# Patient Record
Sex: Female | Born: 1970 | Race: White | Hispanic: No | Marital: Single | Smoking: Current some day smoker
Health system: Southern US, Community
[De-identification: ages and names within clinical notes are randomized; demographics above are authoritative.]

## PROBLEM LIST (undated history)

## (undated) DIAGNOSIS — T4145XA Adverse effect of unspecified anesthetic, initial encounter: Secondary | ICD-10-CM

## (undated) DIAGNOSIS — T8859XA Other complications of anesthesia, initial encounter: Secondary | ICD-10-CM

## (undated) DIAGNOSIS — G43909 Migraine, unspecified, not intractable, without status migrainosus: Secondary | ICD-10-CM

## (undated) DIAGNOSIS — E119 Type 2 diabetes mellitus without complications: Secondary | ICD-10-CM

## (undated) DIAGNOSIS — K219 Gastro-esophageal reflux disease without esophagitis: Secondary | ICD-10-CM

## (undated) DIAGNOSIS — I959 Hypotension, unspecified: Secondary | ICD-10-CM

## (undated) DIAGNOSIS — M549 Dorsalgia, unspecified: Secondary | ICD-10-CM

## (undated) DIAGNOSIS — E1110 Type 2 diabetes mellitus with ketoacidosis without coma: Secondary | ICD-10-CM

## (undated) HISTORY — PX: APPENDECTOMY: SHX54

## (undated) HISTORY — PX: BACK SURGERY: SHX140

## (undated) HISTORY — PX: CHOLECYSTECTOMY: SHX55

## (undated) HISTORY — PX: NECK SURGERY: SHX720

## (undated) HISTORY — DX: Migraine, unspecified, not intractable, without status migrainosus: G43.909

## (undated) HISTORY — DX: Gastro-esophageal reflux disease without esophagitis: K21.9

---
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
## 2001-04-21 ENCOUNTER — Emergency Department (HOSPITAL_COMMUNITY): Admission: EM | Admit: 2001-04-21 | Discharge: 2001-04-22 | Payer: Self-pay | Admitting: Emergency Medicine

## 2001-08-11 ENCOUNTER — Ambulatory Visit (HOSPITAL_COMMUNITY): Admission: RE | Admit: 2001-08-11 | Discharge: 2001-08-12 | Payer: Self-pay | Admitting: Neurological Surgery

## 2001-08-11 ENCOUNTER — Encounter: Payer: Self-pay | Admitting: Neurological Surgery

## 2002-01-22 ENCOUNTER — Encounter: Admission: RE | Admit: 2002-01-22 | Discharge: 2002-03-03 | Payer: Self-pay | Admitting: Neurological Surgery

## 2002-04-28 ENCOUNTER — Encounter: Payer: Self-pay | Admitting: Neurological Surgery

## 2002-04-28 ENCOUNTER — Ambulatory Visit (HOSPITAL_COMMUNITY): Admission: RE | Admit: 2002-04-28 | Discharge: 2002-04-28 | Payer: Self-pay | Admitting: Neurological Surgery

## 2002-07-27 ENCOUNTER — Emergency Department (HOSPITAL_COMMUNITY): Admission: EM | Admit: 2002-07-27 | Discharge: 2002-07-28 | Payer: Self-pay | Admitting: Emergency Medicine

## 2002-07-28 ENCOUNTER — Encounter: Payer: Self-pay | Admitting: Emergency Medicine

## 2005-04-11 ENCOUNTER — Emergency Department (HOSPITAL_COMMUNITY): Admission: EM | Admit: 2005-04-11 | Discharge: 2005-04-11 | Payer: Self-pay | Admitting: Emergency Medicine

## 2005-05-16 ENCOUNTER — Emergency Department (HOSPITAL_COMMUNITY): Admission: EM | Admit: 2005-05-16 | Discharge: 2005-05-16 | Payer: Self-pay | Admitting: Emergency Medicine

## 2005-05-18 ENCOUNTER — Emergency Department (HOSPITAL_COMMUNITY): Admission: EM | Admit: 2005-05-18 | Discharge: 2005-05-19 | Payer: Self-pay | Admitting: Emergency Medicine

## 2005-06-05 ENCOUNTER — Encounter: Admission: RE | Admit: 2005-06-05 | Discharge: 2005-06-05 | Payer: Self-pay | Admitting: Neurological Surgery

## 2005-06-05 ENCOUNTER — Encounter: Admission: RE | Admit: 2005-06-05 | Discharge: 2005-06-05 | Payer: Self-pay | Admitting: Family Medicine

## 2005-08-10 ENCOUNTER — Encounter: Admission: RE | Admit: 2005-08-10 | Discharge: 2005-08-10 | Payer: Self-pay | Admitting: Neurological Surgery

## 2005-09-29 ENCOUNTER — Encounter: Admission: RE | Admit: 2005-09-29 | Discharge: 2005-09-29 | Payer: Self-pay | Admitting: Family Medicine

## 2007-03-18 ENCOUNTER — Emergency Department (HOSPITAL_COMMUNITY): Admission: EM | Admit: 2007-03-18 | Discharge: 2007-03-18 | Payer: Self-pay | Admitting: Emergency Medicine

## 2007-10-14 ENCOUNTER — Inpatient Hospital Stay (HOSPITAL_COMMUNITY): Admission: EM | Admit: 2007-10-14 | Discharge: 2007-10-15 | Payer: Self-pay | Admitting: Emergency Medicine

## 2007-10-26 ENCOUNTER — Emergency Department (HOSPITAL_COMMUNITY): Admission: EM | Admit: 2007-10-26 | Discharge: 2007-10-26 | Payer: Self-pay | Admitting: Emergency Medicine

## 2008-03-05 ENCOUNTER — Emergency Department (HOSPITAL_COMMUNITY): Admission: EM | Admit: 2008-03-05 | Discharge: 2008-03-05 | Payer: Self-pay | Admitting: Emergency Medicine

## 2008-06-09 ENCOUNTER — Emergency Department (HOSPITAL_COMMUNITY): Admission: EM | Admit: 2008-06-09 | Discharge: 2008-06-09 | Payer: Self-pay | Admitting: Emergency Medicine

## 2009-03-22 ENCOUNTER — Observation Stay (HOSPITAL_COMMUNITY): Admission: EM | Admit: 2009-03-22 | Discharge: 2009-03-23 | Payer: Self-pay | Admitting: Emergency Medicine

## 2009-04-19 ENCOUNTER — Emergency Department (HOSPITAL_COMMUNITY): Admission: EM | Admit: 2009-04-19 | Discharge: 2009-04-19 | Payer: Self-pay | Admitting: Emergency Medicine

## 2009-08-05 ENCOUNTER — Emergency Department (HOSPITAL_COMMUNITY): Admission: EM | Admit: 2009-08-05 | Discharge: 2009-08-05 | Payer: Self-pay | Admitting: Emergency Medicine

## 2010-01-21 ENCOUNTER — Emergency Department (HOSPITAL_COMMUNITY): Admission: EM | Admit: 2010-01-21 | Discharge: 2010-01-21 | Payer: Self-pay | Admitting: Emergency Medicine

## 2010-03-06 ENCOUNTER — Encounter: Admission: RE | Admit: 2010-03-06 | Discharge: 2010-03-06 | Payer: Self-pay | Admitting: Internal Medicine

## 2010-08-07 ENCOUNTER — Encounter: Admission: RE | Admit: 2010-08-07 | Discharge: 2010-08-07 | Payer: Self-pay | Admitting: Occupational Medicine

## 2010-10-13 ENCOUNTER — Emergency Department (HOSPITAL_COMMUNITY)
Admission: EM | Admit: 2010-10-13 | Discharge: 2010-10-13 | Payer: Self-pay | Source: Home / Self Care | Admitting: Emergency Medicine

## 2010-11-03 ENCOUNTER — Emergency Department (HOSPITAL_COMMUNITY)
Admission: EM | Admit: 2010-11-03 | Discharge: 2010-11-04 | Payer: Self-pay | Source: Home / Self Care | Admitting: Emergency Medicine

## 2010-12-20 ENCOUNTER — Emergency Department (HOSPITAL_COMMUNITY)
Admission: EM | Admit: 2010-12-20 | Discharge: 2010-12-20 | Payer: Self-pay | Source: Home / Self Care | Admitting: Emergency Medicine

## 2010-12-20 LAB — RAPID STREP SCREEN (MED CTR MEBANE ONLY): Streptococcus, Group A Screen (Direct): NEGATIVE

## 2010-12-30 ENCOUNTER — Emergency Department (HOSPITAL_COMMUNITY)
Admission: EM | Admit: 2010-12-30 | Discharge: 2010-12-30 | Payer: Self-pay | Source: Home / Self Care | Admitting: Emergency Medicine

## 2011-01-06 ENCOUNTER — Encounter: Payer: Self-pay | Admitting: Family Medicine

## 2011-02-28 ENCOUNTER — Emergency Department (HOSPITAL_COMMUNITY)
Admission: EM | Admit: 2011-02-28 | Discharge: 2011-02-28 | Disposition: A | Discharge status: Home / Self Care | Payer: Self-pay | Attending: Emergency Medicine | Admitting: Emergency Medicine

## 2011-02-28 DIAGNOSIS — G43909 Migraine, unspecified, not intractable, without status migrainosus: Secondary | ICD-10-CM | POA: Insufficient documentation

## 2011-02-28 DIAGNOSIS — R110 Nausea: Secondary | ICD-10-CM | POA: Insufficient documentation

## 2011-03-06 LAB — RAPID STREP SCREEN (MED CTR MEBANE ONLY): Streptococcus, Group A Screen (Direct): NEGATIVE

## 2011-03-23 LAB — CBC
HCT: 42.1 % (ref 36.0–46.0)
Hemoglobin: 14.6 g/dL (ref 12.0–15.0)
MCHC: 34.7 g/dL (ref 30.0–36.0)
MCV: 95.5 fL (ref 78.0–100.0)
WBC: 9.7 10*3/uL (ref 4.0–10.5)

## 2011-03-23 LAB — COMPREHENSIVE METABOLIC PANEL
Alkaline Phosphatase: 55 U/L (ref 39–117)
BUN: 10 mg/dL (ref 6–23)
CO2: 27 mEq/L (ref 19–32)
Calcium: 9.1 mg/dL (ref 8.4–10.5)
Chloride: 108 mEq/L (ref 96–112)
Potassium: 4.8 mEq/L (ref 3.5–5.1)
Total Bilirubin: 0.5 mg/dL (ref 0.3–1.2)

## 2011-03-23 LAB — DIFFERENTIAL
Basophils Absolute: 0.1 10*3/uL (ref 0.0–0.1)
Eosinophils Relative: 2 % (ref 0–5)
Monocytes Absolute: 0.7 10*3/uL (ref 0.1–1.0)
Neutro Abs: 6.1 10*3/uL (ref 1.7–7.7)
Neutrophils Relative %: 63 % (ref 43–77)

## 2011-03-23 LAB — URINALYSIS, ROUTINE W REFLEX MICROSCOPIC
Bilirubin Urine: NEGATIVE
Specific Gravity, Urine: 1.025 (ref 1.005–1.030)
pH: 6.5 (ref 5.0–8.0)

## 2011-03-23 LAB — PREGNANCY, URINE: Preg Test, Ur: NEGATIVE

## 2011-03-23 LAB — LIPASE, BLOOD: Lipase: 28 U/L (ref 11–59)

## 2011-03-26 ENCOUNTER — Emergency Department (HOSPITAL_COMMUNITY)
Admission: EM | Admit: 2011-03-26 | Discharge: 2011-03-26 | Disposition: A | Discharge status: Home / Self Care | Payer: Self-pay | Attending: Emergency Medicine | Admitting: Emergency Medicine

## 2011-03-26 DIAGNOSIS — B9789 Other viral agents as the cause of diseases classified elsewhere: Secondary | ICD-10-CM | POA: Insufficient documentation

## 2011-03-26 DIAGNOSIS — R05 Cough: Secondary | ICD-10-CM | POA: Insufficient documentation

## 2011-03-26 DIAGNOSIS — F17200 Nicotine dependence, unspecified, uncomplicated: Secondary | ICD-10-CM | POA: Insufficient documentation

## 2011-03-26 DIAGNOSIS — R059 Cough, unspecified: Secondary | ICD-10-CM | POA: Insufficient documentation

## 2011-03-26 DIAGNOSIS — R112 Nausea with vomiting, unspecified: Secondary | ICD-10-CM | POA: Insufficient documentation

## 2011-03-26 DIAGNOSIS — J029 Acute pharyngitis, unspecified: Secondary | ICD-10-CM | POA: Insufficient documentation

## 2011-03-26 DIAGNOSIS — R509 Fever, unspecified: Secondary | ICD-10-CM | POA: Insufficient documentation

## 2011-03-26 DIAGNOSIS — R300 Dysuria: Secondary | ICD-10-CM | POA: Insufficient documentation

## 2011-03-26 LAB — URINALYSIS, ROUTINE W REFLEX MICROSCOPIC
Glucose, UA: NEGATIVE mg/dL
Nitrite: NEGATIVE
Specific Gravity, Urine: 1.03 (ref 1.005–1.030)
Urobilinogen, UA: 0.2 mg/dL (ref 0.0–1.0)
pH: 5 (ref 5.0–8.0)

## 2011-03-26 LAB — RAPID STREP SCREEN (MED CTR MEBANE ONLY): Streptococcus, Group A Screen (Direct): NEGATIVE

## 2011-03-27 LAB — CBC
HCT: 38.3 % (ref 36.0–46.0)
Hemoglobin: 13.4 g/dL (ref 12.0–15.0)
MCHC: 35.1 g/dL (ref 30.0–36.0)
RBC: 3.77 MIL/uL — ABNORMAL LOW (ref 3.87–5.11)
WBC: 7.2 10*3/uL (ref 4.0–10.5)

## 2011-03-27 LAB — DIFFERENTIAL
Basophils Absolute: 0 10*3/uL (ref 0.0–0.1)
Eosinophils Relative: 2 % (ref 0–5)
Lymphocytes Relative: 34 % (ref 12–46)
Lymphs Abs: 2.6 10*3/uL (ref 0.7–4.0)
Monocytes Relative: 8 % (ref 3–12)
Neutro Abs: 3.9 10*3/uL (ref 1.7–7.7)
Neutro Abs: 4.4 10*3/uL (ref 1.7–7.7)
Neutrophils Relative %: 54 % (ref 43–77)
Neutrophils Relative %: 56 % (ref 43–77)

## 2011-03-27 LAB — BASIC METABOLIC PANEL
Calcium: 8.3 mg/dL — ABNORMAL LOW (ref 8.4–10.5)
Calcium: 9 mg/dL (ref 8.4–10.5)
Chloride: 110 mEq/L (ref 96–112)
Chloride: 112 mEq/L (ref 96–112)
Creatinine, Ser: 0.55 mg/dL (ref 0.4–1.2)
Creatinine, Ser: 0.64 mg/dL (ref 0.4–1.2)
GFR calc Af Amer: >60 mL/min (ref >60)
GFR calc Af Amer: >60 mL/min (ref >60)
GFR calc non Af Amer: >60 mL/min (ref >60)
Glucose, Bld: 102 mg/dL — ABNORMAL HIGH (ref 70–99)
Potassium: 3.2 mEq/L — ABNORMAL LOW (ref 3.5–5.1)
Sodium: 141 mEq/L (ref 135–145)

## 2011-03-27 LAB — HEPATIC FUNCTION PANEL
ALT: 38 U/L — ABNORMAL HIGH (ref 0–35)
Albumin: 3.8 g/dL (ref 3.5–5.2)
Indirect Bilirubin: 0.7 mg/dL (ref 0.3–0.9)
Total Protein: 6.2 g/dL (ref 6.0–8.3)

## 2011-03-27 LAB — URINALYSIS, ROUTINE W REFLEX MICROSCOPIC
Glucose, UA: NEGATIVE mg/dL
Protein, ur: NEGATIVE mg/dL
Specific Gravity, Urine: >1.03 — ABNORMAL HIGH (ref 1.005–1.030)
Urobilinogen, UA: 0.2 mg/dL (ref 0.0–1.0)
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
## 2011-04-30 NOTE — H&P (Signed)
Alexandria Mejia, Alexandria Mejia            ACCOUNT NO.:  [id number]   MEDICAL RECORD NO.:  [id number]          PATIENT TYPE:  OBV   LOCATION:  A315                          FACILITY:  [facility]   PHYSICIAN:  Osvaldo Shipper, MD     DATE OF BIRTH:  [birth year]   DATE OF ADMISSION:  03/21/2009  DATE OF DISCHARGE:  LH                              HISTORY & PHYSICAL   PRIMARY CARE PHYSICIAN:  The patient does not have a PMD.   ADMISSION DIAGNOSES:  1. Likely spider bite, possibly black widow spider.  2. Abdominal cramps, nausea and vomiting, likely related to #1.  3. Obesity.  4. History of migraine headaches.   CHIEF COMPLAINT:  Left arm pain, nausea and abdominal cramps.   HISTORY OF PRESENT ILLNESS:  The patient is a 40 year old Caucasian  female who is obese who has a history of migraine headaches, who was in  her usual state of health at about 8 p.m. on April 6 when she was  working in her yard and then after awhile noticed that her left arm was  itching.  Then she noticed that the area on the forearm was red in  color.  It was hot to touch and it started paining.  This was followed  by onset of nausea and severe abdominal cramping.  The cramps caused  severe pain up to 8/10 in intensity.  Diffusely present all over the  abdomen without any specific radiation.  No precipitant, aggravating or  relieving factors identified.   She is also having a headache now.  The pain in the left arm feels like  a burning sensation.  She does not recall the actual bite.   MEDICATIONS AT HOME:  She just takes ibuprofen as needed for back pain.   ALLERGIES:  CODEINE and SULFA.   PAST MEDICAL HISTORY:  Positive for migraine headache.   SURGICAL HISTORY:  Includes back surgery x2 and knee surgery x2, neck  surgery, appendectomy and cholecystectomy.   SOCIAL HISTORY:  Lives alone in [place].  Does not work.  Smokes half  a pack of cigarettes on a daily basis.  Occasional alcohol use.  No  illicit drug  use.   FAMILY HISTORY:  Positive for heart disease, diabetes and hypertension.   REVIEW OF SYSTEMS:  GENERAL:  This is positive for weakness, malaise.  HEENT:  Unremarkable.  CARDIOVASCULAR:  Unremarkable.  RESPIRATORY:  Unremarkable.  GI:  As in HPI.  GU:  Unremarkable.  NEUROLOGIC:  Unremarkable.  PSYCHIATRIC:  Unremarkable.  DERMATOLOGIC:  As in HPI.  Other systems unremarkable.   PHYSICAL EXAMINATION:  Temperature 98.2, blood pressure 96/50, heart  rate 61, respiratory rate 16, saturation 98% on room air.  GENERAL EXAM:  This is an obese white female in no distress, slightly  somnolent at this time because of benzodiazepines that were given to her  along with narcotics.  HEENT:  There is no pallor, no icterus.  Oral mucous membrane is moist.  No oral lesions are noted.  NECK:  Soft and supple.  No thyromegaly is appreciated.  LUNGS:  Clear to auscultation anteriorly bilaterally.  No wheezing,  rales or rhonchi.  CARDIOVASCULAR:  S1, S2 is normal, regular.  No murmurs appreciated.  No  S3, S4.  No rubs, no bruits.  ABDOMEN:  Soft.  It is diffusely tender without any rebound or rigidity.  Bowel sounds are present.  No masses or organomegaly are appreciated.  EXTREMITIES:  Show no edema.  Examination of the left forearm reveals a  punctate lesion in the left forearm with surrounding erythema.  The area  is warm to touch and it is tender to palpation.  There is an induration  that is noted around this area.  MUSCULOSKELETAL:  Unremarkable.  NEUROLOGIC:  She is alert and oriented x3.  No focal neurologic deficits  are present.   LABORATORY DATA:  Her CBC is normal.  Her BMET showed a potassium of  3.2.  LFTs unremarkable.  Lipase 33.  Beta HCG negative.  UA was  unremarkable for infection.  Specific gravity was greater than 1.030.   Imaging studies include acute abdominal series with suspected SBO.  A CT  was subsequently done, which showed no acute abnormalities.  Fatty liver   was noted, probable peristalsis causing the narrowing in gastric antrum.  No dilated small bowel loops noted on this CT.  Minimal calcification of  the right common iliac artery was noted.   ASSESSMENT:  This is a 40 year old Caucasian female who has a history of  migraine headache, who presents with arm pain and abdominal cramps.  Emergency department physician thinks that this could be a black widow  spider bite.  They have discussed the case with the poison control, who  also feel the same.  The gastrointestinal symptoms are likely because of  neurotoxin from the spider.  The poison center does not feel that at  this time the patient warrants antivenom.  They recommend supportive  care.   PLAN:  1. Possible spider bite.  We will observe her in the hospital.  We      will treat her symptomatically with benzodiazepines, narcotics.      She has received a tetanus shot within the last 5 years and that is      not required.  Since there is erythema and induration, I will go      ahead and give her IV antibiotics in the form of cefazolin.  Blood      pressure will be closely monitored as these kind of spider bites      can cause hemodynamic compromise.  She will be monitored on      telemetry.  If abdominal cramping does not improve, antispasmodics      may be considered such as Bentyl or Levsin.  2. Hypokalemia will be repleted.  I believe the ED physician had      ordered some potassium.  It does not appear to      be the case.  We will give her some potassium p.o. and give her      some p.o. through the IV as well.  3. DVT prophylaxis with Lovenox.   Further management decisions will depend on the results of further  testing and the patient's response to treatment.      Osvaldo Shipper, MD  Electronically Signed     GK/MEDQ  D:  03/22/2009  T:  03/22/2009  Job:  [id number]

## 2011-04-30 NOTE — Discharge Summary (Signed)
NAMEVIRGA, HALTIWANGER            ACCOUNT NO.:  [id number]   MEDICAL RECORD NO.:  [id number]          PATIENT TYPE:  OBV   LOCATION:  A315                          FACILITY:  [facility]   PHYSICIAN:  Dorris Singh, DO    DATE OF BIRTH:  [birth year]   DATE OF ADMISSION:  03/21/2009  DATE OF DISCHARGE:  04/08/2010LH                               DISCHARGE SUMMARY   ADMISSION DIAGNOSES:  1. Spider bite, black widow.  2. Abdominal cramps, nausea and vomiting related to  #1.  3. Obesity.  4. Migraine headaches.   DISCHARGE DIAGNOSES:  1. Spider bite, resolving.  2. Abdominal cramps which were resolving.  3. Obesity.  4. History of migraine headaches.   TESTING:  She had an acute abdominal series which demonstrated  nonspecific gas bowel pattern, mildly dilated loops of small bowel, at  least one air fluid level, and to her represent low grade partial bowel  obstruction.  Serial abdominal __________ advised.  She had CT of the  abdomen and pelvis on April 7 which demonstrated no acute abnormality  with fatty liver and probably peristalsis causing narrowing gastric  antrum and the pelvis showed no evidence of pelvic inflammatory process.  Small bowel loops do not appear dilated or questioned on recent plain  films.  Minimal calcifications right common iliac arteries.   HOSPITAL COURSE:  The patient was admitted with the following diagnosis.  She was started on IV antibiotics.  There was some concern as to whether  this was a black widow spider bite.  Poison Control was called who also  recommended that the patient be admitted and that the gastrointestinal  symptoms were likely due to the neurotoxin of the spider.  Supportive  care is recommended.  She was started on IV antibiotics in the form of  cefazolin.  Her  blood pressure was carefully monitored as well because  of hemodynamic compromise.  While she was here, she ws also placed on  telemetry.  She did have some episodes of  bradycardia.  However,  questioning she  has been told in the past that she does have a low  heart rate, particularly when she sleeps due to previous evaluations.  Her hypokalemia was replaced and it was determined today that she could  be sent home and the patient wants to go home due to a previous  commitment.  She was set up with PCP on-call and also given some Keflex  500 mg one p.o. t.i.d. for the next 5 days.  Her vital signs were  reviewed.  It was determined that the patient could go home today.  Also  her labs were reviewed as well.  She had no abnormalities.  She is  recommended to follow up with the PCP, to  keep area clean and dry, to use cold compresses for pain and Tylenol for  pain.  She is recommended if symptoms worsen, to return.  The patient  stated understanding.  Interview was obtained with family members in her  room.  Spent greater than 30 minutes on this discharge.      Dorris Singh, DO  Electronically Signed     CB/MEDQ  D:  03/23/2009  T:  03/23/2009  Job:  [id number]

## 2011-04-30 NOTE — Group Therapy Note (Signed)
Mejia, Alexandria            ACCOUNT NO.:  [id number]   MEDICAL RECORD NO.:  [id number]          PATIENT TYPE:  INP   LOCATION:  A310                          FACILITY:  [facility]   PHYSICIAN:  Dorris Singh, DO    DATE OF BIRTH:  06-08-1971   DATE OF PROCEDURE:  DATE OF DISCHARGE:                                 PROGRESS NOTE   The patient seen today, states that she feels a little bit better.  She  has fluoroscopy guided LP.  Preliminary results show that she has no  white cells or red cells in her CSF.  There are still some tests pending  at this point in time.  States that her headache is better.  She does  not have the phonophobia, but still is present.  Discussed with the  patient at length the need for primary care.  She also do her  preventative testing like her Pap smear and her screening mammogram.  The patient stated understanding.  I told her we will go ahead and  monitor her, and await for neurology's recommendations and plan on  discharging tomorrow.   The patient's vitals are 98.2 for temperature, pulse 55, respirations  20, blood pressure 105/57.  Generally this is a 40 year old Caucasian female who is well-developed,  well-nourished in no acute distress.  Eyes are PERRL.  EOMI.  The patient has positive body piercings of the  eyebrow.  HEART:  Regular rate and rhythm.  LUNGS:  Clear to auscultation bilaterally.  ABDOMEN:  Large, obese, soft, nontender, nondistended.  EXTREMITIES:  Positive pulses.  No ecchymosis, cyanosis, or edema noted.   Her blood work from today, her INR is 1.0.  She did not have a CBC done  today but yesterday that was within normal limits.  Her cholesterol is  168 and her HDL 26, her LDL 118.  She had a drug screen that was  positive for barbiturates, but negative for everything else that was  done today, as well as her CSF protein within normal limits.  Glucose  within normal limits.  Everything else seems to be normal.  Her UA was  normal as well.  Her blood culture is negative.  At this point in time  we will start the patient plan.   ASSESSMENT:  Severe headaches and chest pain which is not resolved.  The  patient has not complained about it.   PLAN:  Will hold on the patient tonight since she is still having,  according to her, severe headaches.  We will plan on discharge tomorrow  and will have her follow up with a primary care physician.  Will also  start her on Topamax 250 mg and have her follow up that outpatient as  well.      Dorris Singh, DO  Electronically Signed     CB/MEDQ  D:  10/14/2007  T:  10/15/2007  Job:  [id number]

## 2011-05-03 NOTE — Op Note (Signed)
[place]  Patient:    Alexandria Mejia, Alexandria Mejia Visit Number: [id number] MRN: [id number]          Service Type: DSU Location: 3000 [id number] Attending Physician:  Jonne Ply Dictated by:   Stefani Dama, M.D. Proc. Date: 08/11/01 Adm. 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  PROCEDURE:  Microendoscopic diskectomy at L4-5 left with operating microscope and microdissection technique.  SURGEON:  Stefani Dama, M.D.  FIRST ASSISTANT:  Cristi Loron, M.D.  ANESTHESIA:  General endotracheal.  INDICATIONS:  The patient is a 40 year old individual who has had significant back and left lower extremity pain for a period of about four months.  She has tried a number of conservative measures and had seemed to be getting better for a period of time, but over the past month she has had ______ of the pain which she notes is much more severe and acute.  She was advised regarding surgery after an MRI demonstrated a small disk herniation that she had at the L4-5 level on the left that is becoming increasingly large.  PROCEDURE:  The patient was brought to the operating room supine on a stretcher.  After smooth induction of general endotracheal anesthesia she was turned prone.  The back was shaved, prepped with DuraPrep, and draped in a sterile fashion.  The midline was localized radiographically and then the left L4-5 area was also identified on an AP fluoroscopy.  Then, with lateral fluoroscopy the left laminar arch of L4 was localized with a K-wire.  A small incision was made in the skin and then a series of dilators were passed over the K-wire using a winding technique.  The subcutaneous tissues were dissected down to the interlaminar  space at L4-5.  The 18 mm x 6 cm cannula was placed at the L4-5 space on the left.  The microscope was draped and brought into the field and then the soft tissues overlying the surface were cauterized with a monopolar cautery and then the interlaminar space was cleared.  The L4 lamina was then removed partially using an Anspach drill with a 2.8 mm dissecting tool to remove the inferior margin lamina of L4 at the mesial wall of the facette.  The L ligament was then taken up in this area and the common dural tube was explored.  As the lateral aspect of the dural tube was identified and the edge could be retracted medially there was noted to be a significant mass at the L4-5 disk space, elevating the common dural tube right at the takeoff of the L5 nerve root.  This was dissected free and cleared of some small epidural veins, and then with medial retraction of the dural tube and the takeoff of the L5 nerve root the mass was incised.  This was found to be several fragments of disk material in the subligamentous space.  Once these were evacuated the space was noted to connect through the disk space itself which a significant quantity of markedly degenerated disk material.  Using a combination of curettes and rongeurs, then the disk space was evacuated of this disk material.  In the end, the ligamentous structures that were loosened were carefully  rongeured smooth and flat and the disk space was noted to be well decompressed, and the common dural tube and takeoff of the L5 nerve root lay flat against the bone.  The area was then checked for hemostasis in the soft tissues, and once this was achieved adequately the microscope was removed from the field, the endoscopic KO was removed, and then the subcutaneous tissues were closed with 3-0 Vicryl in interrupted fashion, 3-0 Vicryl using the subcuticular tissues to close the skin.  The patient tolerated the procedure well, was returned to the recovery  room in stable condition. Dictated by:   Stefani Dama, M.D. Attending Physician:  Jonne Ply DD:  08/11/01 TD:  08/11/01 Job: [id number]

## 2011-09-25 LAB — PROTEIN, CSF: Total  Protein, CSF: 30

## 2011-09-25 LAB — URINALYSIS, ROUTINE W REFLEX MICROSCOPIC
Bilirubin Urine: NEGATIVE
Glucose, UA: NEGATIVE
Ketones, ur: NEGATIVE
Leukocytes, UA: NEGATIVE
Leukocytes, UA: NEGATIVE
Nitrite: NEGATIVE
Protein, ur: NEGATIVE
Specific Gravity, Urine: >1.03 — ABNORMAL HIGH
Urobilinogen, UA: 0.2
pH: 5.5

## 2011-09-25 LAB — STREP A DNA PROBE

## 2011-09-25 LAB — CBC
MCHC: 34.4
RBC: 4.54
WBC: 10.9 — ABNORMAL HIGH

## 2011-09-25 LAB — APTT: aPTT: 27

## 2011-09-25 LAB — CARDIAC PANEL(CRET KIN+CKTOT+MB+TROPI)
CK, MB: 0.7
CK, MB: 0.8
Total CK: 59
Total CK: 60
Troponin I: 0.01
Troponin I: 0.03

## 2011-09-25 LAB — URINE MICROSCOPIC-ADD ON

## 2011-09-25 LAB — GLUCOSE, CSF: Glucose, CSF: 64

## 2011-09-25 LAB — CSF CELL COUNT WITH DIFFERENTIAL
RBC Count, CSF: 0
Tube #: 4

## 2011-09-25 LAB — BASIC METABOLIC PANEL
Calcium: 9
Creatinine, Ser: 0.62
GFR calc Af Amer: >60

## 2011-09-25 LAB — RAPID URINE DRUG SCREEN, HOSP PERFORMED: Tetrahydrocannabinol: NOT DETECTED

## 2011-09-25 LAB — HEPATIC FUNCTION PANEL
ALT: 36 — ABNORMAL HIGH
AST: 25
Alkaline Phosphatase: 68
Bilirubin, Direct: <0.1
Total Bilirubin: 0.6

## 2011-09-25 LAB — DIFFERENTIAL
Basophils Relative: 1
Monocytes Relative: 7
Neutro Abs: 6.5
Neutrophils Relative %: 60

## 2011-09-25 LAB — POCT CARDIAC MARKERS
CKMB, poc: <1 — ABNORMAL LOW
Myoglobin, poc: 28.4
Operator id: 106841

## 2011-09-25 LAB — LIPID PANEL
Total CHOL/HDL Ratio: 6.5
VLDL: 24

## 2011-09-25 LAB — CULTURE, BLOOD (ROUTINE X 2)
Culture: NO GROWTH
Report Status: 11032008

## 2011-09-25 LAB — CSF CULTURE W GRAM STAIN
Culture: NO GROWTH
Gram Stain: NONE SEEN

## 2011-09-25 LAB — HSV PCR

## 2011-09-25 LAB — PROTIME-INR: INR: 1

## 2011-12-01 ENCOUNTER — Emergency Department (HOSPITAL_COMMUNITY)
Admission: EM | Admit: 2011-12-01 | Discharge: 2011-12-01 | Disposition: A | Discharge status: Home / Self Care | Payer: Self-pay | Attending: Emergency Medicine | Admitting: Emergency Medicine

## 2011-12-01 ENCOUNTER — Emergency Department (HOSPITAL_COMMUNITY): Payer: Self-pay

## 2011-12-01 DIAGNOSIS — R110 Nausea: Secondary | ICD-10-CM | POA: Insufficient documentation

## 2011-12-01 DIAGNOSIS — H538 Other visual disturbances: Secondary | ICD-10-CM | POA: Insufficient documentation

## 2011-12-01 DIAGNOSIS — R404 Transient alteration of awareness: Secondary | ICD-10-CM | POA: Insufficient documentation

## 2011-12-01 DIAGNOSIS — F17200 Nicotine dependence, unspecified, uncomplicated: Secondary | ICD-10-CM | POA: Insufficient documentation

## 2011-12-01 DIAGNOSIS — M546 Pain in thoracic spine: Secondary | ICD-10-CM | POA: Insufficient documentation

## 2011-12-01 DIAGNOSIS — M542 Cervicalgia: Secondary | ICD-10-CM | POA: Insufficient documentation

## 2011-12-01 DIAGNOSIS — W1789XA Other fall from one level to another, initial encounter: Secondary | ICD-10-CM | POA: Insufficient documentation

## 2011-12-01 DIAGNOSIS — S0990XA Unspecified injury of head, initial encounter: Secondary | ICD-10-CM | POA: Insufficient documentation

## 2011-12-01 DIAGNOSIS — M545 Low back pain, unspecified: Secondary | ICD-10-CM | POA: Insufficient documentation

## 2011-12-01 MED ORDER — NAPROXEN 500 MG PO TABS: 500.0000 mg | ORAL_TABLET | Freq: Two times a day (BID) | ORAL | Status: DC

## 2011-12-01 MED ORDER — HYDROCODONE-ACETAMINOPHEN 5-325 MG PO TABS
ORAL_TABLET | ORAL | Status: AC
  Filled 2011-12-01: qty 2

## 2011-12-01 MED ORDER — HYDROCODONE-ACETAMINOPHEN 5-325 MG PO TABS
2.0000 | ORAL_TABLET | Freq: Once | ORAL | Status: AC
  Administered 2011-12-01: 2 via ORAL

## 2011-12-01 MED ORDER — CYCLOBENZAPRINE HCL 10 MG PO TABS: 10.0000 mg | ORAL_TABLET | Freq: Two times a day (BID) | ORAL | Status: AC | PRN
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
## 2012-07-28 ENCOUNTER — Emergency Department (HOSPITAL_COMMUNITY): Payer: BC Managed Care – PPO

## 2012-07-28 ENCOUNTER — Emergency Department (HOSPITAL_COMMUNITY)
Admission: EM | Admit: 2012-07-28 | Discharge: 2012-07-28 | Disposition: A | Discharge status: Home / Self Care | Payer: BC Managed Care – PPO | Attending: Emergency Medicine | Admitting: Emergency Medicine

## 2012-07-28 ENCOUNTER — Encounter (HOSPITAL_COMMUNITY): Payer: Self-pay

## 2012-07-28 DIAGNOSIS — Z9089 Acquired absence of other organs: Secondary | ICD-10-CM | POA: Insufficient documentation

## 2012-07-28 DIAGNOSIS — R10811 Right upper quadrant abdominal tenderness: Secondary | ICD-10-CM | POA: Insufficient documentation

## 2012-07-28 DIAGNOSIS — R42 Dizziness and giddiness: Secondary | ICD-10-CM | POA: Insufficient documentation

## 2012-07-28 DIAGNOSIS — R0602 Shortness of breath: Secondary | ICD-10-CM | POA: Insufficient documentation

## 2012-07-28 DIAGNOSIS — R110 Nausea: Secondary | ICD-10-CM | POA: Insufficient documentation

## 2012-07-28 DIAGNOSIS — R109 Unspecified abdominal pain: Secondary | ICD-10-CM

## 2012-07-28 DIAGNOSIS — M7989 Other specified soft tissue disorders: Secondary | ICD-10-CM | POA: Insufficient documentation

## 2012-07-28 HISTORY — DX: Dorsalgia, unspecified: M54.9

## 2012-07-28 LAB — CBC WITH DIFFERENTIAL/PLATELET
Basophils Absolute: 0 10*3/uL (ref 0.0–0.1)
Basophils Relative: 0 % (ref 0–1)
Eosinophils Absolute: 0.3 10*3/uL (ref 0.0–0.7)
HCT: 40.1 % (ref 36.0–46.0)
Hemoglobin: 13.8 g/dL (ref 12.0–15.0)
MCH: 32.1 pg (ref 26.0–34.0)
MCHC: 34.4 g/dL (ref 30.0–36.0)
Monocytes Absolute: 0.5 10*3/uL (ref 0.1–1.0)
Monocytes Relative: 7 % (ref 3–12)
Neutro Abs: 3.7 10*3/uL (ref 1.7–7.7)
RDW: 13.3 % (ref 11.5–15.5)

## 2012-07-28 LAB — COMPREHENSIVE METABOLIC PANEL
AST: 23 U/L (ref 0–37)
Albumin: 3.5 g/dL (ref 3.5–5.2)
BUN: 12 mg/dL (ref 6–23)
Calcium: 9.5 mg/dL (ref 8.4–10.5)
Creatinine, Ser: 0.61 mg/dL (ref 0.50–1.10)
Total Bilirubin: 0.3 mg/dL (ref 0.3–1.2)
Total Protein: 6.7 g/dL (ref 6.0–8.3)

## 2012-07-28 LAB — URINALYSIS, ROUTINE W REFLEX MICROSCOPIC
Nitrite: NEGATIVE
Specific Gravity, Urine: >1.03 — ABNORMAL HIGH (ref 1.005–1.030)
Urobilinogen, UA: 0.2 mg/dL (ref 0.0–1.0)

## 2012-07-28 LAB — D-DIMER, QUANTITATIVE: D-Dimer, Quant: 0.23 ug/mL-FEU (ref 0.00–0.48)

## 2012-07-28 LAB — TROPONIN I: Troponin I: <0.3 ng/mL (ref <0.30)

## 2012-07-28 LAB — LACTIC ACID, PLASMA: Lactic Acid, Venous: 2 mmol/L (ref 0.5–2.2)

## 2012-07-28 MED ORDER — PANTOPRAZOLE SODIUM 20 MG PO TBEC: 20.0000 mg | DELAYED_RELEASE_TABLET | Freq: Every day | ORAL | Status: DC

## 2012-07-28 MED ORDER — HYDROMORPHONE HCL PF 1 MG/ML IJ SOLN
0.5000 mg | Freq: Once | INTRAMUSCULAR | Status: AC
  Administered 2012-07-28: 1 mg via INTRAVENOUS
  Filled 2012-07-28: qty 1

## 2012-07-28 MED ORDER — ONDANSETRON HCL 4 MG/2ML IJ SOLN
4.0000 mg | Freq: Once | INTRAMUSCULAR | Status: AC
  Administered 2012-07-28: 4 mg via INTRAVENOUS
  Filled 2012-07-28: qty 2

## 2012-07-28 MED ORDER — SODIUM CHLORIDE 0.9 % IV BOLUS (SEPSIS)
1000.0000 mL | Freq: Once | INTRAVENOUS | Status: AC
  Administered 2012-07-28: 1000 mL via INTRAVENOUS

## 2012-07-28 MED ORDER — OXYCODONE-ACETAMINOPHEN 5-325 MG PO TABS: 1.0000 | ORAL_TABLET | Freq: Four times a day (QID) | ORAL | Status: DC | PRN

## 2012-07-28 NOTE — ED Notes (Signed)
Pt reports having dizzy spells for the past few weeks.  Also c/o nausea and pain in r side.  Says has knot to right side x 1 year.

## 2012-07-28 NOTE — ED Provider Notes (Signed)
History   This chart was scribed for Alexandria Lennert, MD by Alexandria Mejia . The patient was seen in room APA02/APA02. Patient's care was started at 1151.    CSN: [id number]  Arrival date & time 07/28/12  1110   First MD Initiated Contact with Patient 07/28/12 1151      Chief Complaint  Patient presents with  . Dizziness    (Consider location/radiation/quality/duration/timing/severity/associated sxs/prior treatment) HPI Comments: Alexandria Mejia is a 41 y.o. female who presents to the Emergency Department complaining of constant, moderate right flank pain with associated SOB, nausea, dizziness, and hand swelling for the past 3 days. Pt states that she has had a knot on right side over the past year that has worsening. Pt reports that yesterday, while mowing yesterday, her right side pain was aggravated with motion. Pt reports that deep breaths aggravates her side pain. Pt reports a h/o cholecystectomy and appendectomy. Pt reports that she is smoker. Pt reports that her LNMP ended 4 days ago.    Patient is a 41 y.o. female presenting with flank pain. The history is provided by the patient.  Flank Pain This is a recurrent problem. The current episode started more than 2 days ago. The problem occurs constantly. The problem has been gradually worsening. Associated symptoms include shortness of breath. Pertinent negatives include no chest pain, no abdominal pain and no headaches. Exacerbated by: Breathing and motion. Nothing relieves the symptoms. She has tried nothing for the symptoms.    Past Medical History  Diagnosis Date  . Back pain     Past Surgical History  Procedure Date  . Back surgery   . Cholecystectomy   . Appendectomy   . Neck surgery     No family history on file.  History  Substance Use Topics  . Smoking status: Current Everyday Smoker  . Smokeless tobacco: Not on file  . Alcohol Use: Yes     occ    OB History    Grav Para Term Preterm Abortions  TAB SAB Ect Mult Living                  Review of Systems  HENT: Negative for congestion, sinus pressure and ear discharge.   Eyes: Negative for discharge.  Respiratory: Positive for shortness of breath. Negative for cough.   Cardiovascular: Negative for chest pain.  Gastrointestinal: Positive for nausea. Negative for vomiting and abdominal pain.  Genitourinary: Positive for flank pain.  Musculoskeletal: Positive for joint swelling.       Hand swelling   Skin: Negative for rash.  Neurological: Positive for dizziness. Negative for seizures and headaches.  Hematological: Negative.   Psychiatric/Behavioral: Negative for hallucinations.  All other systems reviewed and are negative.    Allergies  Codeine and Sulfa antibiotics  Home Medications   Current Outpatient Rx  Name Route Sig Dispense Refill  . NAPROXEN 500 MG PO TABS Oral Take 1 tablet (500 mg total) by mouth 2 (two) times daily. 30 tablet 0    BP 98/46  Pulse 63  Temp 97.8 F (36.6 C) (Oral)  Resp 18  Ht 5\' 9"  (1.753 m)  Wt 230 lb (104.327 kg)  BMI 33.96 kg/m2  SpO2 98%  LMP 07/21/2012  Physical Exam  Constitutional: She is oriented to person, place, and time. She appears well-developed.  HENT:  Head: Normocephalic and atraumatic.  Eyes: Conjunctivae and EOM are normal. No scleral icterus.  Neck: Neck supple. No thyromegaly present.  Cardiovascular: Normal rate,  regular rhythm and normal heart sounds.  Exam reveals no gallop and no friction rub.   No murmur heard. Pulmonary/Chest: Effort normal and breath sounds normal. No stridor. She has no wheezes. She has no rales. She exhibits no tenderness.  Abdominal: Soft. Bowel sounds are normal. She exhibits no distension. There is tenderness. There is no rebound.       Moderate RUQ tenderness.   Musculoskeletal: Normal range of motion. She exhibits no edema.  Lymphadenopathy:    She has no cervical adenopathy.  Neurological: She is oriented to person, place,  and time. Coordination normal.  Skin: No rash noted. No erythema.  Psychiatric: She has a normal mood and affect. Her behavior is normal.    ED Course  Procedures (including critical care time)  DIAGNOSTIC STUDIES: Oxygen Saturation is 98% on room air, normal by my interpretation.    COORDINATION OF CARE:  11:58-Discussed planned course of treatment with the patient including blood work, who is agreeable at this time.   12:15-Medication Orders: Ondansetron (Zofran) injection 4 mg-once; Sodium chloride 0.9% bolus 1,000 mL-once.   Labs Reviewed - No data to display No results found.   No diagnosis found.  Pt improved with tx  Date: 07/28/2012  Rate:52  Rhythm: normal sinus rhythm  QRS Axis: left  Intervals: normal  ST/T Wave abnormalities: normal  Conduction Disutrbances:none  Narrative Interpretation:   Old EKG Reviewed: none available   MDM  Pt with normal labs and x-rays.  Will tx flank pain and abd pain with percocet and protonix   The chart was scribed for me under my direct supervision.  I personally performed the history, physical, and medical decision making and all procedures in the evaluation of this patient.Alexandria Lennert, MD 07/28/12 [phone]

## 2012-08-04 ENCOUNTER — Encounter: Payer: Self-pay | Admitting: Family Medicine

## 2012-08-04 ENCOUNTER — Ambulatory Visit (INDEPENDENT_AMBULATORY_CARE_PROVIDER_SITE_OTHER): Payer: BC Managed Care – PPO | Admitting: Family Medicine

## 2012-08-04 VITALS — BP 110/74 | HR 84 | Resp 16 | Ht 69.0 in | Wt 238.0 lb

## 2012-08-04 DIAGNOSIS — E6609 Other obesity due to excess calories: Secondary | ICD-10-CM | POA: Insufficient documentation

## 2012-08-04 DIAGNOSIS — R110 Nausea: Secondary | ICD-10-CM

## 2012-08-04 DIAGNOSIS — E669 Obesity, unspecified: Secondary | ICD-10-CM

## 2012-08-04 DIAGNOSIS — Z13220 Encounter for screening for lipoid disorders: Secondary | ICD-10-CM

## 2012-08-04 DIAGNOSIS — Z720 Tobacco use: Secondary | ICD-10-CM

## 2012-08-04 DIAGNOSIS — F17200 Nicotine dependence, unspecified, uncomplicated: Secondary | ICD-10-CM

## 2012-08-04 DIAGNOSIS — R7309 Other abnormal glucose: Secondary | ICD-10-CM | POA: Insufficient documentation

## 2012-08-04 DIAGNOSIS — G8929 Other chronic pain: Secondary | ICD-10-CM

## 2012-08-04 DIAGNOSIS — M549 Dorsalgia, unspecified: Secondary | ICD-10-CM

## 2012-08-04 MED ORDER — CYCLOBENZAPRINE HCL 10 MG PO TABS: 10.0000 mg | ORAL_TABLET | Freq: Three times a day (TID) | ORAL | Status: DC | PRN

## 2012-08-04 NOTE — Patient Instructions (Signed)
Start the flexeril Use heat  Continue to work on the smoking Get the labs done fasting - at least 2 days before the physical Schedule physical 6 weeks

## 2012-08-04 NOTE — Assessment & Plan Note (Signed)
Check A1C 

## 2012-08-04 NOTE — Assessment & Plan Note (Signed)
Add flexeril, use Heat to area of spasm, history of Lumbar diskectomy.  No further work-up needed at this time
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
## 2012-08-04 NOTE — Assessment & Plan Note (Signed)
Improved with PPI, she was on high doses of NSAIDS

## 2012-08-04 NOTE — Assessment & Plan Note (Signed)
Counseled on smoking cessation, she is now down to 2-3 a week. Has quit in the past

## 2012-08-05 LAB — HEMOGLOBIN A1C
Hgb A1c MFr Bld: 5.9 % — ABNORMAL HIGH (ref <5.7)
Mean Plasma Glucose: 123 mg/dL — ABNORMAL HIGH (ref <117)

## 2012-08-05 LAB — LIPID PANEL
LDL Cholesterol: 139 mg/dL — ABNORMAL HIGH (ref 0–99)
Total CHOL/HDL Ratio: 5.8 Ratio

## 2012-08-27 ENCOUNTER — Telehealth: Payer: Self-pay | Admitting: Family Medicine

## 2012-08-28 NOTE — Telephone Encounter (Signed)
Can pt have a refill or need another appt first?

## 2012-08-30 NOTE — Telephone Encounter (Signed)
You can refill Percocet, # 40 tablets, she has to come pick up

## 2012-08-31 MED ORDER — OXYCODONE-ACETAMINOPHEN 5-325 MG PO TABS: 1.0000 | ORAL_TABLET | Freq: Four times a day (QID) | ORAL | Status: DC | PRN

## 2012-08-31 NOTE — Telephone Encounter (Signed)
Printed to be signed.  

## 2012-09-01 ENCOUNTER — Other Ambulatory Visit: Payer: Self-pay

## 2012-09-01 MED ORDER — OXYCODONE-ACETAMINOPHEN 5-325 MG PO TABS: 1.0000 | ORAL_TABLET | Freq: Four times a day (QID) | ORAL | Status: DC | PRN

## 2012-09-15 ENCOUNTER — Encounter: Payer: BC Managed Care – PPO | Admitting: Family Medicine

## 2012-09-17 ENCOUNTER — Other Ambulatory Visit (HOSPITAL_COMMUNITY)
Admission: RE | Admit: 2012-09-17 | Discharge: 2012-09-17 | Disposition: A | Discharge status: Home / Self Care | Payer: BC Managed Care – PPO | Source: Ambulatory Visit | Attending: Family Medicine | Admitting: Family Medicine

## 2012-09-17 ENCOUNTER — Ambulatory Visit (INDEPENDENT_AMBULATORY_CARE_PROVIDER_SITE_OTHER): Payer: BC Managed Care – PPO | Admitting: Family Medicine

## 2012-09-17 ENCOUNTER — Encounter: Payer: Self-pay | Admitting: Family Medicine

## 2012-09-17 VITALS — BP 118/74 | HR 72 | Resp 15 | Wt 230.1 lb

## 2012-09-17 DIAGNOSIS — Z01419 Encounter for gynecological examination (general) (routine) without abnormal findings: Secondary | ICD-10-CM | POA: Insufficient documentation

## 2012-09-17 DIAGNOSIS — Z1239 Encounter for other screening for malignant neoplasm of breast: Secondary | ICD-10-CM

## 2012-09-17 DIAGNOSIS — Z124 Encounter for screening for malignant neoplasm of cervix: Secondary | ICD-10-CM

## 2012-09-17 DIAGNOSIS — M549 Dorsalgia, unspecified: Secondary | ICD-10-CM

## 2012-09-17 DIAGNOSIS — Z1211 Encounter for screening for malignant neoplasm of colon: Secondary | ICD-10-CM

## 2012-09-17 DIAGNOSIS — M25519 Pain in unspecified shoulder: Secondary | ICD-10-CM

## 2012-09-17 DIAGNOSIS — F439 Reaction to severe stress, unspecified: Secondary | ICD-10-CM

## 2012-09-17 DIAGNOSIS — Z720 Tobacco use: Secondary | ICD-10-CM

## 2012-09-17 DIAGNOSIS — F438 Other reactions to severe stress: Secondary | ICD-10-CM

## 2012-09-17 DIAGNOSIS — B372 Candidiasis of skin and nail: Secondary | ICD-10-CM

## 2012-09-17 DIAGNOSIS — Z113 Encounter for screening for infections with a predominantly sexual mode of transmission: Secondary | ICD-10-CM | POA: Insufficient documentation

## 2012-09-17 DIAGNOSIS — Z0000 Encounter for general adult medical examination without abnormal findings: Secondary | ICD-10-CM | POA: Insufficient documentation

## 2012-09-17 DIAGNOSIS — F17200 Nicotine dependence, unspecified, uncomplicated: Secondary | ICD-10-CM

## 2012-09-17 DIAGNOSIS — F4389 Other reactions to severe stress: Secondary | ICD-10-CM

## 2012-09-17 DIAGNOSIS — G8929 Other chronic pain: Secondary | ICD-10-CM

## 2012-09-17 HISTORY — DX: Pain in unspecified shoulder: M25.519

## 2012-09-17 HISTORY — DX: Candidiasis of skin and nail: B37.2

## 2012-09-17 LAB — POC HEMOCCULT BLD/STL (OFFICE/1-CARD/DIAGNOSTIC): Fecal Occult Blood, POC: NEGATIVE

## 2012-09-17 MED ORDER — PANTOPRAZOLE SODIUM 20 MG PO TBEC: 20.0000 mg | DELAYED_RELEASE_TABLET | Freq: Every day | ORAL | Status: DC

## 2012-09-17 MED ORDER — DIAZEPAM 5 MG PO TABS: 5.0000 mg | ORAL_TABLET | Freq: Three times a day (TID) | ORAL | Status: DC | PRN

## 2012-09-17 MED ORDER — CLOTRIMAZOLE 1 % EX CREA: TOPICAL_CREAM | Freq: Two times a day (BID) | CUTANEOUS | Status: DC

## 2012-09-17 MED ORDER — OXYCODONE-ACETAMINOPHEN 5-325 MG PO TABS: 1.0000 | ORAL_TABLET | Freq: Four times a day (QID) | ORAL | Status: AC | PRN

## 2012-09-17 NOTE — Assessment & Plan Note (Signed)
She is trying to cut down and stop smoking.

## 2012-09-17 NOTE — Progress Notes (Signed)
  Subjective:    Patient ID: Alexandria Mejia, female    DOB: 20-May-1971, 41 y.o.   MRN: [id number]  HPI Pt here for CPE. She has not had GYN exam in many years.  Normal menses, she occasionally gets a sharp pain near clitoris when aroused but is in same sex relationship and does not perform in penetrating activities. She is concerned her partner is cheating currently. She has been very stressed about about this and they are having trouble communicating.  Left shoulder pain on and off x 2 years. She initially injured it on the job at [employer] and [employer], xray from 2009 normal, she gets stiffness every few months in shoulder, she has been using ibuprofen.    Review of Systems  GEN- denies fatigue, fever, weight loss,weakness, recent illness HEENT- denies eye drainage, change in vision, nasal discharge, CVS- denies chest pain, palpitations RESP- denies SOB, cough, wheeze ABD- denies N/V, change in stools, abd pain GU- denies dysuria, hematuria, dribbling, incontinence MSK- + joint pain, muscle aches, injury Neuro- denies headache, dizziness, syncope, seizure activity      Objective:   Physical Exam GEN- NAD, alert and oriented x 3 Neck- supple, no thyromegaly CVS-RRR, no murmur RESP-CTAB Breast- normal symmetry, no nipple inversion,no nipple drainage, no nodules or lumps felt Nodes- no axillary nodes GU- normal external genitalia, vaginal mucosa pink and moist, cervix visualized no growth, no blood form os, minimal thin clear discharge, no CMT, no ovarian masses, uterus normal size, skin between gluteal cleft erythematous with mild maceration, also noted in inguinal region MSK- left shoulder- normal inspections, decreased ROM, + empty can, biceps in tact, decreased strength compared to Right UE, sensation in tact Pulse- Radial 2+  Psych- crying during exam,not anxious appearing, depressed/sad affect, no SI      Assessment & Plan:

## 2012-09-17 NOTE — Assessment & Plan Note (Signed)
Percocet refills. She was also given prescription for Valium for muscle spasms
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
## 2012-09-17 NOTE — Patient Instructions (Addendum)
I recommend eye visit once a year I recommend dental visit every 6 months Goal is to  Exercise 30 minutes 5 days a week We will send a letter with PAP results  We will set you up Mammogram  Call if your shoulder does not improve  F/U 6 MONTHS

## 2012-09-17 NOTE — Assessment & Plan Note (Signed)
Clotrimazole cream

## 2012-09-17 NOTE — Assessment & Plan Note (Addendum)
Physical exam done with Pap smear. She'll be referred for mammogram. She declines immunization Labs reviewed with patient. Mildly elevated cholesterol

## 2012-09-17 NOTE — Addendum Note (Signed)
Addended by: Abner Greenspan on: 09/17/2012 01:12 PM   Modules accepted: Orders

## 2012-09-17 NOTE — Assessment & Plan Note (Signed)
She may have a chronic rotator strain that flares up every few months. At this time she wants to hold orthopedics. She will take ibuprofen as well as Percocet as needed for pain. This does not improve or to referral

## 2012-09-17 NOTE — Addendum Note (Signed)
Addended by: Abner Greenspan on: 09/17/2012 01:08 PM   Modules accepted: Orders

## 2012-12-14 ENCOUNTER — Telehealth: Payer: Self-pay | Admitting: Family Medicine

## 2012-12-15 MED ORDER — CLOTRIMAZOLE 1 % EX CREA: TOPICAL_CREAM | Freq: Two times a day (BID) | CUTANEOUS | Status: DC

## 2012-12-15 NOTE — Telephone Encounter (Signed)
Clotrimazole was called in during her last visit, was sent to pharmacy She may leave a urine specimen this one time She needs to increase water and cranberry juice

## 2012-12-15 NOTE — Telephone Encounter (Signed)
Pt aware.

## 2012-12-15 NOTE — Telephone Encounter (Signed)
Believes she has a UTI and she can't afford to do that.  X 1 week has been having dysuria and itching and she said the last time she was here a cream or something for irritation was supposed to be sent in but was not. She cannot afford to come in but could leave a urine specimen if possible. Please advise

## 2013-05-04 ENCOUNTER — Emergency Department (HOSPITAL_COMMUNITY)
Admission: EM | Admit: 2013-05-04 | Discharge: 2013-05-04 | Disposition: A | Discharge status: Home / Self Care | Payer: BC Managed Care – PPO | Attending: Emergency Medicine | Admitting: Emergency Medicine

## 2013-05-04 ENCOUNTER — Encounter (HOSPITAL_COMMUNITY): Payer: Self-pay | Admitting: *Deleted

## 2013-05-04 DIAGNOSIS — K1370 Unspecified lesions of oral mucosa: Secondary | ICD-10-CM | POA: Insufficient documentation

## 2013-05-04 DIAGNOSIS — K219 Gastro-esophageal reflux disease without esophagitis: Secondary | ICD-10-CM | POA: Insufficient documentation

## 2013-05-04 DIAGNOSIS — Z8679 Personal history of other diseases of the circulatory system: Secondary | ICD-10-CM | POA: Insufficient documentation

## 2013-05-04 DIAGNOSIS — F17200 Nicotine dependence, unspecified, uncomplicated: Secondary | ICD-10-CM | POA: Insufficient documentation

## 2013-05-04 DIAGNOSIS — R1310 Dysphagia, unspecified: Secondary | ICD-10-CM | POA: Insufficient documentation

## 2013-05-04 DIAGNOSIS — Z8739 Personal history of other diseases of the musculoskeletal system and connective tissue: Secondary | ICD-10-CM | POA: Insufficient documentation

## 2013-05-04 DIAGNOSIS — Z79899 Other long term (current) drug therapy: Secondary | ICD-10-CM | POA: Insufficient documentation

## 2013-05-04 DIAGNOSIS — L259 Unspecified contact dermatitis, unspecified cause: Secondary | ICD-10-CM | POA: Insufficient documentation

## 2013-05-04 DIAGNOSIS — L239 Allergic contact dermatitis, unspecified cause: Secondary | ICD-10-CM

## 2013-05-04 MED ORDER — PREDNISONE 10 MG PO TABS: 20.0000 mg | ORAL_TABLET | Freq: Every day | ORAL | Status: DC

## 2013-05-04 MED ORDER — METHYLPREDNISOLONE SODIUM SUCC 125 MG IJ SOLR
125.0000 mg | Freq: Once | INTRAMUSCULAR | Status: AC
  Administered 2013-05-04: 125 mg via INTRAMUSCULAR
  Filled 2013-05-04: qty 2

## 2013-05-04 NOTE — ED Notes (Signed)
Itching rash to arms, and "bumps in my mouth"

## 2013-05-04 NOTE — ED Provider Notes (Signed)
History  This chart was scribed for Benny Lennert, MD by Bennett Scrape, ED Scribe. This patient was seen in room APFT22/APFT22 and the patient's care was started at 3:36 PM.  CSN: [id number]  Arrival date & time 05/04/13  1514   First MD Initiated Contact with Patient 05/04/13 1536      Chief Complaint  Patient presents with  . Rash    The history is provided by the patient. No language interpreter was used.    HPI Comments: Alexandria Mejia is a 42 y.o. female who presents to the Emergency Department complaining of gradual onset, gradually worsening, constant itchy rash that started on the bilateral arms and legs and has radiated to the posterior neck and torso with an associated ulcer in her mouth that she attributes to poison oak. She reports that she has pain with swallowing due to the ulcer but denies having a sore throat. She reports possible contact with a poison oak leaf. She states that she had similar symptoms last year around the same time that were also diagnosed as poison oak. She denies fever and sore throat as associated symptoms. She has a h/o GERD and back pain. Pt is a current everyday smoker and occasional alcohol user.   Past Medical History  Diagnosis Date  . Back pain   . GERD (gastroesophageal reflux disease)   . Migraine     Past Surgical History  Procedure Laterality Date  . Back surgery    . Cholecystectomy    . Appendectomy    . Neck surgery      Family History  Problem Relation Age of Onset  . COPD Mother   . Arthritis Mother   . Diabetes Father   . Heart disease Father   . Hypertension Brother   . Diabetes Maternal Grandmother   . Diabetes Paternal Grandmother   . Dementia Paternal Grandfather     History  Substance Use Topics  . Smoking status: Current Some Day Smoker  . Smokeless tobacco: Not on file  . Alcohol Use: Yes     Comment: occ    No OB history provided.  Review of Systems  Constitutional: Negative for fever.   HENT: Negative for sore throat.   Skin: Positive for rash.    Allergies  Codeine and Sulfa antibiotics  Home Medications   Current Outpatient Rx  Name  Route  Sig  Dispense  Refill  . clotrimazole (LOTRIMIN) 1 % cream   Topical   Apply topically 2 (two) times daily.   30 g   0   . cyclobenzaprine (FLEXERIL) 10 MG tablet   Oral   Take 1 tablet (10 mg total) by mouth every 8 (eight) hours as needed for muscle spasms.   30 tablet   1   . diazepam (VALIUM) 5 MG tablet   Oral   Take 1 tablet (5 mg total) by mouth every 8 (eight) hours as needed for anxiety or sleep. For spasm   30 tablet   1   . ibuprofen (ADVIL,MOTRIN) 200 MG tablet   Oral   Take 800 mg by mouth every 6 (six) hours as needed. Back Pain         . pantoprazole (PROTONIX) 20 MG tablet   Oral   Take 1 tablet (20 mg total) by mouth daily.   30 tablet   3     Triage Vitals: BP 116/71  Pulse 71  Temp(Src) 98 F (36.7 C) (Oral)  Resp 20  Ht 5\' 9"  (1.753 m)  Wt 230 lb (104.327 kg)  BMI 33.95 kg/m2  SpO2 99%  LMP 05/04/2013  Physical Exam  Nursing note and vitals reviewed. Constitutional: She is oriented to person, place, and time. She appears well-developed and well-nourished.  HENT:  Head: Normocephalic and atraumatic.  One small ulcer to the inside of the mouth  Eyes: Conjunctivae are normal.  Neck: No tracheal deviation present.  Cardiovascular: Normal rate.   Pulmonary/Chest: Effort normal. No respiratory distress.  Musculoskeletal: Normal range of motion.  Neurological: She is alert and oriented to person, place, and time.  Skin: Skin is warm and dry.  Mild rash to bilateral arms, minimal rash to bilateral legs  Psychiatric: She has a normal mood and affect. Her behavior is normal.    ED Course  Procedures (including critical care time)  DIAGNOSTIC STUDIES: Oxygen Saturation is 99% on room air, normal by my interpretation.    COORDINATION OF CARE: 3:38 PM-Discussed treatment  plan which includes f/u with PCP with pt at bedside and pt agreed to plan.   Labs Reviewed - No data to display No results found.   No diagnosis found.    MDM        The chart was scribed for me under my direct supervision.  I personally performed the history, physical, and medical decision making and all procedures in the evaluation of this patient.Benny Lennert, MD 05/04/13 [phone]

## 2013-05-07 ENCOUNTER — Encounter (HOSPITAL_COMMUNITY): Payer: Self-pay | Admitting: *Deleted

## 2013-05-07 ENCOUNTER — Emergency Department (HOSPITAL_COMMUNITY)
Admission: EM | Admit: 2013-05-07 | Discharge: 2013-05-07 | Disposition: A | Discharge status: Home / Self Care | Payer: BC Managed Care – PPO | Attending: Emergency Medicine | Admitting: Emergency Medicine

## 2013-05-07 DIAGNOSIS — Z8739 Personal history of other diseases of the musculoskeletal system and connective tissue: Secondary | ICD-10-CM | POA: Insufficient documentation

## 2013-05-07 DIAGNOSIS — S81831A Puncture wound without foreign body, right lower leg, initial encounter: Secondary | ICD-10-CM

## 2013-05-07 DIAGNOSIS — S81809A Unspecified open wound, unspecified lower leg, initial encounter: Secondary | ICD-10-CM | POA: Insufficient documentation

## 2013-05-07 DIAGNOSIS — Y9389 Activity, other specified: Secondary | ICD-10-CM | POA: Insufficient documentation

## 2013-05-07 DIAGNOSIS — Z8719 Personal history of other diseases of the digestive system: Secondary | ICD-10-CM | POA: Insufficient documentation

## 2013-05-07 DIAGNOSIS — W278XXA Contact with other nonpowered hand tool, initial encounter: Secondary | ICD-10-CM | POA: Insufficient documentation

## 2013-05-07 DIAGNOSIS — Y929 Unspecified place or not applicable: Secondary | ICD-10-CM | POA: Insufficient documentation

## 2013-05-07 DIAGNOSIS — S81009A Unspecified open wound, unspecified knee, initial encounter: Secondary | ICD-10-CM | POA: Insufficient documentation

## 2013-05-07 DIAGNOSIS — F17200 Nicotine dependence, unspecified, uncomplicated: Secondary | ICD-10-CM | POA: Insufficient documentation

## 2013-05-07 DIAGNOSIS — Z8679 Personal history of other diseases of the circulatory system: Secondary | ICD-10-CM | POA: Insufficient documentation

## 2013-05-07 DIAGNOSIS — Z23 Encounter for immunization: Secondary | ICD-10-CM | POA: Insufficient documentation

## 2013-05-07 MED ORDER — CEPHALEXIN 250 MG PO CAPS: 500.0000 mg | ORAL_CAPSULE | Freq: Two times a day (BID) | ORAL | Status: DC

## 2013-05-07 MED ORDER — TETANUS-DIPHTH-ACELL PERTUSSIS 5-2.5-18.5 LF-MCG/0.5 IM SUSP
INTRAMUSCULAR | Status: AC
  Administered 2013-05-07: 0.5 mL via INTRAMUSCULAR
  Filled 2013-05-07: qty 0.5

## 2013-05-07 NOTE — ED Notes (Signed)
Superficial lac to rt ant leg. Upper tibia.  No active bleeding.  Says she accidentally hit her leg with an ax at work 2 hours ago.

## 2013-05-07 NOTE — ED Provider Notes (Signed)
History     CSN: [id number]  Arrival date & time 05/07/13  1522   First MD Initiated Contact with Patient 05/07/13 1537      Chief Complaint  Patient presents with  . Puncture Wound    (Consider location/radiation/quality/duration/timing/severity/associated sxs/prior treatment) HPI Alexandria Mejia is a 42 y.o. female who presents to the ED with a puncture wound to the lower leg. The injury occurred while she was using an axe to drive a metal pole in the ground and it hit her right leg. There is some bleeding to the area and she needs a tetanus booster. The history was provided by the patient.  Past Medical History  Diagnosis Date  . Back pain   . GERD (gastroesophageal reflux disease)   . Migraine     Past Surgical History  Procedure Laterality Date  . Back surgery    . Cholecystectomy    . Appendectomy    . Neck surgery      Family History  Problem Relation Age of Onset  . COPD Mother   . Arthritis Mother   . Diabetes Father   . Heart disease Father   . Hypertension Brother   . Diabetes Maternal Grandmother   . Diabetes Paternal Grandmother   . Dementia Paternal Grandfather     History  Substance Use Topics  . Smoking status: Current Some Day Smoker  . Smokeless tobacco: Not on file  . Alcohol Use: Yes     Comment: occ    OB History   Grav Para Term Preterm Abortions TAB SAB Ect Mult Living                  Review of Systems  Constitutional: Negative for fever.  HENT: Negative for neck pain.   Gastrointestinal: Negative for nausea and vomiting.  Skin: Positive for wound.  Psychiatric/Behavioral: The patient is not nervous/anxious.     Allergies  Codeine and Sulfa antibiotics  Home Medications   Current Outpatient Rx  Name  Route  Sig  Dispense  Refill  . ibuprofen (ADVIL,MOTRIN) 200 MG tablet   Oral   Take 800 mg by mouth every 6 (six) hours as needed. Back Pain         . predniSONE (DELTASONE) 10 MG tablet   Oral   Take 2 tablets  (20 mg total) by mouth daily.   14 tablet   0     BP 116/70  Pulse 79  Temp(Src) 97.6 F (36.4 C) (Oral)  Resp 20  Ht 5\' 9"  (1.753 m)  Wt 230 lb (104.327 kg)  BMI 33.95 kg/m2  SpO2 99%  LMP 05/04/2013  Physical Exam  Nursing note and vitals reviewed. Constitutional: She is oriented to person, place, and time. She appears well-developed and well-nourished. No distress.  Eyes: EOM are normal.  Neck: Neck supple.  Cardiovascular: Normal rate.   Pulmonary/Chest: Effort normal.  Musculoskeletal:       Right lower leg: She exhibits tenderness and laceration.       Legs: There is a .5 cm laceration to the right lower leg. There is a small amount of bleeding at this time.  Neurological: She is alert and oriented to person, place, and time. No cranial nerve deficit.  Skin: Skin is warm and dry.  Psychiatric: She has a normal mood and affect. Her behavior is normal. Judgment and thought content normal.   ED Course  Procedures (including critical care time)  LACERATION REPAIR Performed by: Nithin Demeo Authorized by:  Dash Cardarelli Consent: Verbal consent obtained. Risks and benefits: risks, benefits and alternatives were discussed Consent given by: patient Patient identity confirmed: provided demographic data Prepped and Draped in normal sterile fashion Wound explored  Laceration Location: right lower leg Laceration Length: 0.5 cm  No Foreign Bodies seen or palpated  Irrigation method: syringe Amount of cleaning: standard  Skin closure: Dermabond  MDM  42 y.o. female with small laceration to the right lower leg. I have discussed treatment and plan of care with the patient and she voices understanding. Tetanus booster given. Patient stable for discharge home.         [address] [place], [place] 05/08/13 [phone]

## 2013-05-07 NOTE — ED Notes (Signed)
States she hit the back of her leg with an axe,causing a puncture wound

## 2013-05-09 NOTE — ED Provider Notes (Signed)
Medical screening examination/treatment/procedure(s) were performed by non-physician practitioner and as supervising physician I was immediately available for consultation/collaboration.  Toy Baker, MD 05/09/13 [phone]

## 2013-09-06 ENCOUNTER — Emergency Department (HOSPITAL_COMMUNITY): Payer: BC Managed Care – PPO

## 2013-09-06 ENCOUNTER — Encounter (HOSPITAL_COMMUNITY): Payer: Self-pay | Admitting: *Deleted

## 2013-09-06 ENCOUNTER — Emergency Department (HOSPITAL_COMMUNITY)
Admission: EM | Admit: 2013-09-06 | Discharge: 2013-09-06 | Disposition: A | Discharge status: Home / Self Care | Payer: BC Managed Care – PPO | Attending: Emergency Medicine | Admitting: Emergency Medicine

## 2013-09-06 DIAGNOSIS — Z8719 Personal history of other diseases of the digestive system: Secondary | ICD-10-CM | POA: Insufficient documentation

## 2013-09-06 DIAGNOSIS — Z8679 Personal history of other diseases of the circulatory system: Secondary | ICD-10-CM | POA: Insufficient documentation

## 2013-09-06 DIAGNOSIS — Z792 Long term (current) use of antibiotics: Secondary | ICD-10-CM | POA: Insufficient documentation

## 2013-09-06 DIAGNOSIS — J40 Bronchitis, not specified as acute or chronic: Secondary | ICD-10-CM | POA: Insufficient documentation

## 2013-09-06 DIAGNOSIS — F17200 Nicotine dependence, unspecified, uncomplicated: Secondary | ICD-10-CM | POA: Insufficient documentation

## 2013-09-06 DIAGNOSIS — Z3202 Encounter for pregnancy test, result negative: Secondary | ICD-10-CM | POA: Insufficient documentation

## 2013-09-06 DIAGNOSIS — J029 Acute pharyngitis, unspecified: Secondary | ICD-10-CM | POA: Insufficient documentation

## 2013-09-06 DIAGNOSIS — Z720 Tobacco use: Secondary | ICD-10-CM

## 2013-09-06 LAB — CBC WITH DIFFERENTIAL/PLATELET
Basophils Relative: 0 % (ref 0–1)
Hemoglobin: 13 g/dL (ref 12.0–15.0)
MCHC: 34.1 g/dL (ref 30.0–36.0)
Monocytes Relative: 9 % (ref 3–12)
Neutro Abs: 4.6 10*3/uL (ref 1.7–7.7)
Neutrophils Relative %: 55 % (ref 43–77)
Platelets: 237 10*3/uL (ref 150–400)
RBC: 4.08 MIL/uL (ref 3.87–5.11)

## 2013-09-06 LAB — URINALYSIS, ROUTINE W REFLEX MICROSCOPIC
Bilirubin Urine: NEGATIVE
Leukocytes, UA: NEGATIVE
Nitrite: NEGATIVE
Specific Gravity, Urine: 1.025 (ref 1.005–1.030)
Urobilinogen, UA: 0.2 mg/dL (ref 0.0–1.0)
pH: 6 (ref 5.0–8.0)

## 2013-09-06 LAB — BASIC METABOLIC PANEL
BUN: 10 mg/dL (ref 6–23)
Chloride: 103 mEq/L (ref 96–112)
GFR calc Af Amer: >90 mL/min (ref >90)
GFR calc non Af Amer: >90 mL/min (ref >90)
Potassium: 4.1 mEq/L (ref 3.5–5.1)
Sodium: 139 mEq/L (ref 135–145)

## 2013-09-06 LAB — POCT PREGNANCY, URINE: Preg Test, Ur: NEGATIVE

## 2013-09-06 LAB — RAPID STREP SCREEN (MED CTR MEBANE ONLY): Streptococcus, Group A Screen (Direct): NEGATIVE

## 2013-09-06 MED ORDER — DOXYCYCLINE HYCLATE 100 MG PO CAPS: 100.0000 mg | ORAL_CAPSULE | Freq: Two times a day (BID) | ORAL | Status: DC

## 2013-09-06 MED ORDER — HYDROCOD POLST-CHLORPHEN POLST 10-8 MG/5ML PO LQCR
5.0000 mL | Freq: Once | ORAL | Status: AC
  Administered 2013-09-06: 5 mL via ORAL
  Filled 2013-09-06: qty 5

## 2013-09-06 MED ORDER — HYDROCOD POLST-CHLORPHEN POLST 10-8 MG/5ML PO LQCR: 5.0000 mL | Freq: Two times a day (BID) | ORAL | Status: DC

## 2013-09-06 MED ORDER — IPRATROPIUM BROMIDE 0.02 % IN SOLN
0.5000 mg | Freq: Once | RESPIRATORY_TRACT | Status: AC
  Administered 2013-09-06: 0.5 mg via RESPIRATORY_TRACT
  Filled 2013-09-06: qty 2.5

## 2013-09-06 MED ORDER — ALBUTEROL SULFATE (5 MG/ML) 0.5% IN NEBU
5.0000 mg | INHALATION_SOLUTION | Freq: Once | RESPIRATORY_TRACT | Status: AC
  Administered 2013-09-06: 5 mg via RESPIRATORY_TRACT
  Filled 2013-09-06: qty 0.5

## 2013-09-06 MED ORDER — ALBUTEROL SULFATE HFA 108 (90 BASE) MCG/ACT IN AERS
2.0000 | INHALATION_SPRAY | RESPIRATORY_TRACT | Status: DC | PRN
  Administered 2013-09-06: 2 via RESPIRATORY_TRACT
  Filled 2013-09-06: qty 6.7

## 2013-09-06 MED ORDER — AEROCHAMBER Z-STAT PLUS/MEDIUM MISC
1.0000 | Freq: Once | Status: AC
  Administered 2013-09-06: 1
  Filled 2013-09-06: qty 1

## 2013-09-06 NOTE — ED Provider Notes (Signed)
CSN: [id number]     Arrival date & time 09/06/13  1840 History   First MD Initiated Contact with Patient 09/06/13 1912     Chief Complaint  Patient presents with  . Cough   (Consider location/radiation/quality/duration/timing/severity/associated sxs/prior Treatment) HPI Comments: Alexandria Mejia is a 42 y.o. female who presents for evaluation of cough, sore throat, and headache. She's been ill for 2 weeks, with waxing and waning symptoms. She believes that she has strep throat because several family members have it currently. She denies fever. She has a nonproductive cough. She has mild dyspnea with exertion. She does not have any chest pain, abdominal pain. No weakness, or dizziness. She is using over-the-counter medicines, without relief. There are no other known modifying factors   Patient is a 42 y.o. female presenting with cough. The history is provided by the patient.  Cough   Past Medical History  Diagnosis Date  . Back pain   . GERD (gastroesophageal reflux disease)   . Migraine    Past Surgical History  Procedure Laterality Date  . Back surgery    . Cholecystectomy    . Appendectomy    . Neck surgery     Family History  Problem Relation Age of Onset  . COPD Mother   . Arthritis Mother   . Diabetes Father   . Heart disease Father   . Hypertension Brother   . Diabetes Maternal Grandmother   . Diabetes Paternal Grandmother   . Dementia Paternal Grandfather    History  Substance Use Topics  . Smoking status: Current Some Day Smoker  . Smokeless tobacco: Not on file  . Alcohol Use: Yes     Comment: occ   OB History   Grav Para Term Preterm Abortions TAB SAB Ect Mult Living                 Review of Systems  Respiratory: Positive for cough.   All other systems reviewed and are negative.    Allergies  Codeine and Sulfa antibiotics  Home Medications   Current Outpatient Rx  Name  Route  Sig  Dispense  Refill  . ibuprofen (ADVIL,MOTRIN) 200 MG  tablet   Oral   Take 800 mg by mouth every 6 (six) hours as needed. Back Pain         . pseudoephedrine (SUDAFED) 30 MG tablet   Oral   Take 30 mg by mouth every 4 (four) hours as needed for congestion.         Marland Kitchen UNKNOWN TO PATIENT   Oral   Take 5 mLs by mouth as needed (NATURAL SUPPLEMENT PURCHASED AT [facility]ORE AS NEEDED FOR COUGH AND COLD SYMPTOMS).         . chlorpheniramine-HYDROcodone (TUSSIONEX) 10-8 MG/5ML LQCR   Oral   Take 5 mLs by mouth every 12 (twelve) hours.   140 mL   0   . doxycycline (VIBRAMYCIN) 100 MG capsule   Oral   Take 1 capsule (100 mg total) by mouth 2 (two) times daily.   20 capsule   0    BP 101/56  Pulse 94  Temp(Src) 99.1 F (37.3 C) (Oral)  Resp 18  Ht 5\' 9"  (1.753 m)  Wt 230 lb (104.327 kg)  BMI 33.95 kg/m2  SpO2 96%  LMP 08/06/2013 Physical Exam  Nursing note and vitals reviewed. Constitutional: She is oriented to person, place, and time. She appears well-developed and well-nourished.  HENT:  Head: Normocephalic and atraumatic.  Right Ear: External ear normal.  Left Ear: External ear normal.  Mouth/Throat: No oropharyngeal exudate.  No tonsillar hypertrophy  Eyes: Conjunctivae and EOM are normal. Pupils are equal, round, and reactive to light. Right eye exhibits no discharge. Left eye exhibits no discharge.  Neck: Normal range of motion and phonation normal. Neck supple.  Cardiovascular: Normal rate, regular rhythm and intact distal pulses.   Pulmonary/Chest: Effort normal and breath sounds normal. No respiratory distress. She has no wheezes. She has no rales. She exhibits no tenderness.  Persistent dry cough  Abdominal: Soft. She exhibits no distension. There is no tenderness. There is no guarding.  Musculoskeletal: Normal range of motion.  Neurological: She is alert and oriented to person, place, and time. She has normal strength. She exhibits normal muscle tone.  Skin: Skin is warm and dry.  Psychiatric: She has a  normal mood and affect. Her behavior is normal. Judgment and thought content normal.    ED Course  Procedures (including critical care time)  Medications  albuterol (PROVENTIL HFA;VENTOLIN HFA) 108 (90 BASE) MCG/ACT inhaler 2 puff (2 puffs Inhalation Given 09/06/13 2148)  albuterol (PROVENTIL) (5 MG/ML) 0.5% nebulizer solution 5 mg (5 mg Nebulization Given 09/06/13 1930)  ipratropium (ATROVENT) nebulizer solution 0.5 mg (0.5 mg Nebulization Given 09/06/13 1930)  chlorpheniramine-HYDROcodone (TUSSIONEX) 10-8 MG/5ML suspension 5 mL (5 mLs Oral Given 09/06/13 2140)  aerochamber Z-Stat Plus/medium 1 each (1 each Other Given by Other 09/06/13 2218)    Patient Vitals for the past 24 hrs:  BP Temp Temp src Pulse Resp SpO2 Height Weight  09/06/13 2159 101/56 mmHg - - 94 18 96 % - -  09/06/13 2148 - - - - - 97 % - -  09/06/13 1931 - - - - - 98 % - -  09/06/13 1848 101/42 mmHg 99.1 F (37.3 C) Oral 78 19 95 % 5\' 9"  (1.753 m) 230 lb (104.327 kg)      Labs Review Labs Reviewed  BASIC METABOLIC PANEL - Abnormal; Notable for the following:    Glucose, Bld 165 (*)    All other components within normal limits  RAPID STREP SCREEN  CULTURE, GROUP A STREP  CBC WITH DIFFERENTIAL  URINALYSIS, ROUTINE W REFLEX MICROSCOPIC  POCT PREGNANCY, URINE   Imaging Review Dg Chest 2 View  09/06/2013   CLINICAL DATA:  Cough with sore throat and headaches.  EXAM: CHEST  2 VIEW  COMPARISON:  Thoracic spine radiographs 12/01/2011. Chest and rib radiographs 08/05/2009.  FINDINGS: There are low lung volumes with mild bibasilar atelectasis. No confluent airspace opacity, pleural effusion or pneumothorax is identified. The heart size and mediastinal contours are stable. No acute osseous findings are identified.  IMPRESSION: Low lung volumes with mild bibasilar atelectasis. No confluent airspace opacity.   Electronically Signed   By: Roxy Horseman   On: 09/06/2013 20:32    MDM   1. Bronchitis   2. Tobacco abuse     Evaluation consistent with bronchitis, secondary to tobacco abuse. Evidence for pneumonia, metabolic instability, or severe systemic infection. She stable for discharge with outpatient management.  Nursing Notes Reviewed/ Care Coordinated, and agree without changes. Applicable Imaging Reviewed.  Interpretation of Laboratory Data incorporated into ED treatment    Plan: Home Medications- doxycycline and Tussionex, albuterol; Home Treatments and Observation- rest, stop smoking; return here if the recommended treatment, does not improve the symptoms; Recommended follow up- PCP, for check up in one week    Flint Melter, MD 09/06/13 2225

## 2013-09-06 NOTE — ED Notes (Signed)
Cough, sore throat, Headache

## 2013-09-06 NOTE — ED Notes (Signed)
Pt reporting cough, sore throat and congestion for "about 2 weeks."  Reports several people in household have had strep throat.

## 2013-09-08 LAB — CULTURE, GROUP A STREP

## 2014-01-31 ENCOUNTER — Emergency Department (HOSPITAL_COMMUNITY): Payer: Worker's Compensation

## 2014-01-31 ENCOUNTER — Encounter (HOSPITAL_COMMUNITY): Payer: Self-pay | Admitting: Emergency Medicine

## 2014-01-31 ENCOUNTER — Emergency Department (HOSPITAL_COMMUNITY)
Admission: EM | Admit: 2014-01-31 | Discharge: 2014-01-31 | Disposition: A | Discharge status: Home / Self Care | Payer: Worker's Compensation | Attending: Emergency Medicine | Admitting: Emergency Medicine

## 2014-01-31 DIAGNOSIS — F17200 Nicotine dependence, unspecified, uncomplicated: Secondary | ICD-10-CM | POA: Insufficient documentation

## 2014-01-31 DIAGNOSIS — S40029A Contusion of unspecified upper arm, initial encounter: Secondary | ICD-10-CM

## 2014-01-31 DIAGNOSIS — Y9389 Activity, other specified: Secondary | ICD-10-CM | POA: Insufficient documentation

## 2014-01-31 DIAGNOSIS — Z8719 Personal history of other diseases of the digestive system: Secondary | ICD-10-CM | POA: Insufficient documentation

## 2014-01-31 DIAGNOSIS — Z8679 Personal history of other diseases of the circulatory system: Secondary | ICD-10-CM | POA: Insufficient documentation

## 2014-01-31 DIAGNOSIS — W172XXA Fall into hole, initial encounter: Secondary | ICD-10-CM | POA: Insufficient documentation

## 2014-01-31 DIAGNOSIS — Y990 Civilian activity done for income or pay: Secondary | ICD-10-CM | POA: Insufficient documentation

## 2014-01-31 DIAGNOSIS — Y9264 Mine or pit as the place of occurrence of the external cause: Secondary | ICD-10-CM | POA: Insufficient documentation

## 2014-01-31 DIAGNOSIS — S5010XA Contusion of unspecified forearm, initial encounter: Secondary | ICD-10-CM | POA: Insufficient documentation

## 2014-01-31 DIAGNOSIS — S6990XA Unspecified injury of unspecified wrist, hand and finger(s), initial encounter: Secondary | ICD-10-CM | POA: Insufficient documentation

## 2014-01-31 DIAGNOSIS — S40019A Contusion of unspecified shoulder, initial encounter: Secondary | ICD-10-CM | POA: Insufficient documentation

## 2014-01-31 MED ORDER — HYDROCODONE-ACETAMINOPHEN 5-325 MG PO TABS
2.0000 | ORAL_TABLET | Freq: Once | ORAL | Status: AC
  Administered 2014-01-31: 2 via ORAL
  Filled 2014-01-31: qty 2

## 2014-01-31 MED ORDER — HYDROCODONE-ACETAMINOPHEN 5-325 MG PO TABS: 1.0000 | ORAL_TABLET | ORAL | Status: DC | PRN

## 2014-01-31 MED ORDER — IBUPROFEN 600 MG PO TABS: 600.0000 mg | ORAL_TABLET | Freq: Four times a day (QID) | ORAL | Status: DC | PRN

## 2014-01-31 NOTE — ED Notes (Addendum)
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  Ice pack .good rt radial pulse.

## 2014-01-31 NOTE — ED Notes (Signed)
Patient reports falling in grave at work. Patient reports falling into a hole 52" deep onto right side. Patient reports extreme pain in right shoulder radiating down right arm and into hand. Patient reports right hand swelling. Per patient hit head but did not have LOC, headache, dizziness, or blurred vision.

## 2014-01-31 NOTE — ED Notes (Signed)
Sent to lab for drug screen

## 2014-01-31 NOTE — Discharge Instructions (Signed)
Contusion A contusion is a deep bruise. Contusions are the result of an injury that caused bleeding under the skin. The contusion may turn blue, purple, or yellow. Minor injuries will give you a painless contusion, but more severe contusions may stay painful and swollen for a few weeks.  CAUSES  A contusion is usually caused by a blow, trauma, or direct force to an area of the body. SYMPTOMS   Swelling and redness of the injured area.  Bruising of the injured area.  Tenderness and soreness of the injured area.  Pain. DIAGNOSIS  The diagnosis can be made by taking a history and physical exam. An X-ray, CT scan, or MRI may be needed to determine if there were any associated injuries, such as fractures. TREATMENT  Specific treatment will depend on what area of the body was injured. In general, the best treatment for a contusion is resting, icing, elevating, and applying cold compresses to the injured area. Over-the-counter medicines may also be recommended for pain control. Ask your caregiver what the best treatment is for your contusion. HOME CARE INSTRUCTIONS   Put ice on the injured area.  Put ice in a plastic bag.  Place a towel between your skin and the bag.  Leave the ice on for 15-20 minutes, 03-04 times a day.  Only take over-the-counter or prescription medicines for pain, discomfort, or fever as directed by your caregiver. Your caregiver may recommend avoiding anti-inflammatory medicines (aspirin, ibuprofen, and naproxen) for 48 hours because these medicines may increase bruising.  Rest the injured area.  If possible, elevate the injured area to reduce swelling. SEEK IMMEDIATE MEDICAL CARE IF:   You have increased bruising or swelling.  You have pain that is getting worse.  Your swelling or pain is not relieved with medicines. MAKE SURE YOU:   Understand these instructions.  Will watch your condition.  Will get help right away if you are not doing well or get  worse. Document Released: 09/11/2005 Document Revised: 02/24/2012 Document Reviewed: 10/07/2011 [facility] Patient Information 2014 [place], [place].    Wear the sling for comfort.  Use ice as much as possible for the next several days to help reduce the swelling.  Take the medications prescribed.  You may take the hydrocodone prescribed for pain relief.  This will make you drowsy - do not drive within 4 hours of taking this medication.  Use the ibuprofen also for inflammation.  Call the orthopedic doctor listed for a recheck of your injury in 1 week.  Your xrays are normal tonight.

## 2014-01-31 NOTE — ED Provider Notes (Signed)
CSN: [id number]     Arrival date & time 01/31/14  1239 History  This chart was scribed for non-physician practitioner Alexandria Amor, PA-C working with Alexandria Lennert, MD by Alexandria Mejia, ED scribe. This patient was seen in room APFT24/APFT24 and the patient's care was started at 3:45 PM.   Chief Complaint  Patient presents with  . Shoulder Injury  . Arm Pain   (Consider location/radiation/quality/duration/timing/severity/associated sxs/prior Treatment) The history is provided by the patient. No language interpreter was used.   HPI Comments: Alexandria Mejia is a 43 y.o. female who presents to the Emergency Department complaining of constant, right shoulder pain, onset 12:00 PM today when she fell 52" into a grave while at work. She reports the side of the grave gave way and she fell onto her right side, mainly on her shoulder that was tucked to her side. She reports her shoulder pain radiates down her arm to her right hand.  She reports being right hand dominant. She reports hitting her head, but denies LOC, headache, dizziness, blurred vision, neck pain and any other associated symptoms. She reports h/o GERD. She states she takes Ibuprofen every day for h/o back pain. She sees Alexandria Mejia. She denies having seen orthopedist. She reports allergy to Codeine in high dosage, stating it makes her itch.   PCP - Alexandria Antis, MD  Past Medical History  Diagnosis Date  . Back pain   . GERD (gastroesophageal reflux disease)   . Migraine    Past Surgical History  Procedure Laterality Date  . Back surgery    . Cholecystectomy    . Appendectomy    . Neck surgery     Family History  Problem Relation Age of Onset  . COPD Mother   . Arthritis Mother   . Diabetes Father   . Heart disease Father   . Hypertension Brother   . Diabetes Maternal Grandmother   . Diabetes Paternal Grandmother   . Dementia Paternal Grandfather    History  Substance Use Topics  . Smoking status: Current Some  Day Smoker -- 0.07 packs/day for 30 years    Types: Cigarettes  . Smokeless tobacco: Never Used  . Alcohol Use: No   OB History   Grav Para Term Preterm Abortions TAB SAB Ect Mult Living                 Review of Systems  Constitutional: Negative for fever.  Eyes: Negative for visual disturbance.  Musculoskeletal: Positive for myalgias (right shoulder, raidates down arm into hand). Negative for back pain, neck pain and neck stiffness.  Skin: Negative for wound.  Neurological: Negative for dizziness, syncope, weakness and numbness.    Allergies  Codeine and Sulfa antibiotics  Home Medications   Current Outpatient Rx  Name  Route  Sig  Dispense  Refill  . ibuprofen (ADVIL,MOTRIN) 200 MG tablet   Oral   Take 800 mg by mouth every 6 (six) hours as needed. pain         . HYDROcodone-acetaminophen (NORCO/VICODIN) 5-325 MG per tablet   Oral   Take 1 tablet by mouth every 4 (four) hours as needed for moderate pain.   15 tablet   0   . ibuprofen (ADVIL,MOTRIN) 600 MG tablet   Oral   Take 1 tablet (600 mg total) by mouth every 6 (six) hours as needed.   30 tablet   0    BP 117/68  Pulse 64  Temp(Src) 97.5 F (36.4  C) (Oral)  Resp 17  Ht 5\' 9"  (1.753 m)  Wt 230 lb (104.327 kg)  BMI 33.95 kg/m2  SpO2 98%  LMP 01/10/2014  Physical Exam  Constitutional: She is oriented to person, place, and time. She appears well-developed and well-nourished. No distress.  HENT:  Head: Normocephalic.  Eyes: Conjunctivae and EOM are normal.  Neck: Neck supple.  Cardiovascular: Normal rate.   No murmur heard. Pulmonary/Chest: Effort normal.  Musculoskeletal: She exhibits tenderness.       Right shoulder: She exhibits decreased range of motion and tenderness. She exhibits no bony tenderness, no deformity and no laceration.  Neurological: She is alert and oriented to person, place, and time. No sensory deficit.  Skin: Skin is warm.  Psychiatric: She has a normal mood and affect.     ED Course  Procedures (including critical care time)  DIAGNOSTIC STUDIES: Oxygen Saturation is 98% on room air, normal by my interpretation.    COORDINATION OF CARE: 3:50 PM- Discussed negative imaging results with pt. Discussed treatment plan which includes pain medication with pt at bedside and pt agreed to plan. Will put pt in arm sling. Advised pt to ice the area 10 minutes every hour for 2 days. Will refer pt to orthopedist.   Dg Forearm Right  01/31/2014   CLINICAL DATA:  Right shoulder and arm pain into hand, fell 4.5 feet  EXAM: RIGHT FOREARM - 2 VIEW  COMPARISON:  None  FINDINGS: Mild dorsal soft tissue swelling at the right wrist.  Osseous mineralization normal.  Joint spaces preserved.  No acute fracture, dislocation, or bone destruction.  IMPRESSION: No acute right forearm abnormalities.   Electronically Signed   By: Ulyses SouthwardMark  Boles M.D.   On: 01/31/2014 14:36   Dg Humerus Right  01/31/2014   CLINICAL DATA:  Right side pain post fall 4.5 feet, right shoulder pain radiating down right arm  EXAM: RIGHT HUMERUS - 2+ VIEW  COMPARISON:  None  FINDINGS: Osseous mineralization normal.  AC joint alignment normal.  No acute fracture, dislocation, or bone destruction.  IMPRESSION: No acute right humeral abnormalities.   Electronically Signed   By: Ulyses SouthwardMark  Boles M.D.   On: 01/31/2014 14:34   Medications  HYDROcodone-acetaminophen (NORCO/VICODIN) 5-325 MG per tablet 2 tablet (2 tablets Oral Given 01/31/14 1617)    MDM   Final diagnoses:  Contusion shoulder/arm    Patients labs and/or radiological studies were viewed and considered during the medical decision making and disposition process. Ice tx, sling provided,  Prescribed ibuprofen,  Hydrocodone. Recheck by Dr Hilda LiasKeeling in 1 week if sx are not improving.  Pt to call for appt.  I personally performed the services described in this documentation, which was scribed in my presence. The recorded information has been reviewed and is  accurate.    Alexandria AmorJulie Tenika Keeran, PA-C 01/31/14 1919

## 2014-01-31 NOTE — ED Provider Notes (Signed)
Medical screening examination/treatment/procedure(s) were performed by non-physician practitioner and as supervising physician I was immediately available for consultation/collaboration.  EKG Interpretation   None         Bensyn Bornemann L Ambrie Carte, MD 01/31/14 2338 

## 2014-02-03 ENCOUNTER — Telehealth: Payer: Self-pay | Admitting: Orthopedic Surgery

## 2014-02-03 NOTE — Telephone Encounter (Signed)
Patient called following Alexandria Mejia Emergency Room visit for shoulder problem following work-related injury 01/31/14.  Relayed process of worker's compensation and that we can offer appointment upon receipt of claim information and required paperwork. *  * Patient's employer called as well - [employer], contact person Sable FeilKathy Morrow, who provided the [employer] contact, [employer], Attention: Chales AbrahamsMary Ann Funchess ph# [phone].  Their insurance company is [employer].  Appointment pending.  Patient aware of status.

## 2014-02-04 ENCOUNTER — Emergency Department (HOSPITAL_COMMUNITY)
Admission: EM | Admit: 2014-02-04 | Discharge: 2014-02-04 | Disposition: A | Discharge status: Home / Self Care | Payer: Worker's Compensation | Attending: Emergency Medicine | Admitting: Emergency Medicine

## 2014-02-04 ENCOUNTER — Encounter (HOSPITAL_COMMUNITY): Payer: Self-pay | Admitting: Emergency Medicine

## 2014-02-04 DIAGNOSIS — K600 Acute anal fissure: Secondary | ICD-10-CM

## 2014-02-04 DIAGNOSIS — F17200 Nicotine dependence, unspecified, uncomplicated: Secondary | ICD-10-CM | POA: Insufficient documentation

## 2014-02-04 DIAGNOSIS — Z8719 Personal history of other diseases of the digestive system: Secondary | ICD-10-CM | POA: Insufficient documentation

## 2014-02-04 DIAGNOSIS — Z8669 Personal history of other diseases of the nervous system and sense organs: Secondary | ICD-10-CM | POA: Insufficient documentation

## 2014-02-04 DIAGNOSIS — K602 Anal fissure, unspecified: Secondary | ICD-10-CM | POA: Insufficient documentation

## 2014-02-04 LAB — CBC WITH DIFFERENTIAL/PLATELET
Basophils Absolute: 0 K/uL (ref 0.0–0.1)
Basophils Relative: 0 % (ref 0–1)
Eosinophils Absolute: 0.2 K/uL (ref 0.0–0.7)
Eosinophils Relative: 3 % (ref 0–5)
HCT: 42.2 % (ref 36.0–46.0)
Hemoglobin: 14.2 g/dL (ref 12.0–15.0)
Lymphocytes Relative: 32 % (ref 12–46)
Lymphs Abs: 2.6 K/uL (ref 0.7–4.0)
MCH: 31.6 pg (ref 26.0–34.0)
MCHC: 33.6 g/dL (ref 30.0–36.0)
MCV: 94 fL (ref 78.0–100.0)
Monocytes Absolute: 0.5 K/uL (ref 0.1–1.0)
Monocytes Relative: 7 % (ref 3–12)
Neutro Abs: 4.8 K/uL (ref 1.7–7.7)
Neutrophils Relative %: 59 % (ref 43–77)
Platelets: 260 K/uL (ref 150–400)
RBC: 4.49 MIL/uL (ref 3.87–5.11)
RDW: 13.1 % (ref 11.5–15.5)
WBC: 8.2 K/uL (ref 4.0–10.5)

## 2014-02-04 LAB — COMPREHENSIVE METABOLIC PANEL WITH GFR
ALT: 29 U/L (ref 0–35)
AST: 19 U/L (ref 0–37)
Albumin: 3.7 g/dL (ref 3.5–5.2)
Alkaline Phosphatase: 58 U/L (ref 39–117)
BUN: 13 mg/dL (ref 6–23)
CO2: 25 meq/L (ref 19–32)
Calcium: 9.5 mg/dL (ref 8.4–10.5)
Chloride: 103 meq/L (ref 96–112)
Creatinine, Ser: 0.58 mg/dL (ref 0.50–1.10)
GFR calc Af Amer: >90 mL/min
GFR calc non Af Amer: >90 mL/min
Glucose, Bld: 119 mg/dL — ABNORMAL HIGH (ref 70–99)
Potassium: 4 meq/L (ref 3.7–5.3)
Sodium: 140 meq/L (ref 137–147)
Total Bilirubin: 0.2 mg/dL — ABNORMAL LOW (ref 0.3–1.2)
Total Protein: 7.3 g/dL (ref 6.0–8.3)

## 2014-02-04 LAB — POC OCCULT BLOOD, ED: FECAL OCCULT BLD: POSITIVE — AB

## 2014-02-04 MED ORDER — DOCUSATE SODIUM 100 MG PO CAPS: 100.0000 mg | ORAL_CAPSULE | Freq: Two times a day (BID) | ORAL | Status: DC

## 2014-02-04 NOTE — Discharge Instructions (Signed)
Anal Fissure, Adult  An anal fissure is a small tear or crack in the skin around the anus. Bleeding from a fissure usually stops on its own within a few minutes. However, bleeding will often reoccur with each bowel movement until the crack heals.   CAUSES    Passing large, hard stools.   Frequent diarrheal stools.   Constipation.   Inflammatory bowel disease (Crohn's disease or ulcerative colitis).   Infections.   Anal sex.  SYMPTOMS    Small amounts of blood seen on your stools, on toilet paper, or in the toilet after a bowel movement.   Rectal bleeding.   Painful bowel movements.   Itching or irritation around the anus.  DIAGNOSIS  Your caregiver will examine the anal area. An anal fissure can usually be seen with careful inspection. A rectal exam may be performed and a short tube (anoscope) may be used to examine the anal canal.  TREATMENT    You may be instructed to take fiber supplements. These supplements can soften your stool to help make bowel movements easier.   Sitz baths may be recommended to help heal the tear. Do not use soap in the sitz baths.   A medicated cream or ointment may be prescribed to lessen discomfort.  HOME CARE INSTRUCTIONS    Maintain a diet high in fruits, whole grains, and vegetables. Avoid constipating foods like bananas and dairy products.   Take sitz baths as directed by your caregiver.   Drink enough fluids to keep your urine clear or pale yellow.   Only take over-the-counter or prescription medicines for pain, discomfort, or fever as directed by your caregiver. Do not take aspirin as this may increase bleeding.   Do not use ointments containing numbing medications (anesthetics) or hydrocortisone. They could slow healing.  SEEK MEDICAL CARE IF:    Your fissure is not completely healed within 3 days.   You have further bleeding.   You have a fever.   You have diarrhea mixed with blood.   You have pain.   Your problem is getting worse rather than  better.  MAKE SURE YOU:    Understand these instructions.   Will watch your condition.   Will get help right away if you are not doing well or get worse.  Document Released: 12/02/2005 Document Revised: 02/24/2012 Document Reviewed: 05/19/2011  ExitCare Patient Information 2014 [employer].

## 2014-02-04 NOTE — ED Notes (Signed)
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 Started noticing blood in stool Monday night. Has been noticing clots and bright red blood in stool and on toilet paper. Rates pain as 4/10 in lower rt abdomen.

## 2014-02-04 NOTE — ED Notes (Signed)
Pt on phone. Will not get off, unable to triage at this time

## 2014-02-04 NOTE — ED Provider Notes (Signed)
CSN: [id number]     Arrival date & time 02/04/14  1257 History   This chart was scribed for Shon Baton, MD by Donne Anon, ED Scribe. This patient was seen in room APA19/APA19 and the patient's care was started at 1410.   First MD Initiated Contact with Patient 02/04/14 1410     Chief Complaint  Patient presents with  . Rectal Bleeding      The history is provided by the patient. No language interpreter was used.   HPI Comments: Alexandria Mejia is a 43 y.o. female who presents to the Emergency Department complaining of 5 days of rectal pain and rectal bleeding with bowel movements that began after she fell 5 day ago and landed on her right side. She denies LOC from the fall. There is only blood after she has bowel movements. She states she also injured her right arm, right shoulder and right ribs. She was evaluated here but has referred her to an orthopedist. She was placed on hydrocodone and has been taking that. She reports that her bowel movements are not hard but may be harder than normal. She denies constipation. She denies hematuria, abdominal pain or any other symptoms.  Denies dizziness or syncope. Denies any history of inflammatory bowel disease.  Past Medical History  Diagnosis Date  . Back pain   . GERD (gastroesophageal reflux disease)   . Migraine    Past Surgical History  Procedure Laterality Date  . Back surgery    . Cholecystectomy    . Appendectomy    . Neck surgery     Family History  Problem Relation Age of Onset  . COPD Mother   . Arthritis Mother   . Diabetes Father   . Heart disease Father   . Hypertension Brother   . Diabetes Maternal Grandmother   . Diabetes Paternal Grandmother   . Dementia Paternal Grandfather    History  Substance Use Topics  . Smoking status: Current Some Day Smoker -- 0.07 packs/day for 30 years    Types: Cigarettes  . Smokeless tobacco: Never Used  . Alcohol Use: No   OB History   Grav Para Term Preterm  Abortions TAB SAB Ect Mult Living                 Review of Systems  Constitutional: Negative for fever.  Respiratory: Negative for cough, chest tightness and shortness of breath.   Cardiovascular: Negative for chest pain.  Gastrointestinal: Positive for blood in stool and rectal pain. Negative for nausea, vomiting, abdominal pain and diarrhea.  Genitourinary: Negative for dysuria, hematuria and vaginal bleeding.  Musculoskeletal: Negative for back pain.  Skin: Negative for wound.  Neurological: Negative for headaches.  Psychiatric/Behavioral: Negative for confusion.  All other systems reviewed and are negative.      Allergies  Codeine and Sulfa antibiotics  Home Medications   Current Outpatient Rx  Name  Route  Sig  Dispense  Refill  . HYDROcodone-acetaminophen (NORCO/VICODIN) 5-325 MG per tablet   Oral   Take 1 tablet by mouth every 4 (four) hours as needed for moderate pain.   15 tablet   0   . ibuprofen (ADVIL,MOTRIN) 200 MG tablet   Oral   Take 800 mg by mouth every 6 (six) hours as needed. pain         . ibuprofen (ADVIL,MOTRIN) 600 MG tablet   Oral   Take 1 tablet (600 mg total) by mouth every 6 (six) hours as needed.  30 tablet   0   . docusate sodium (COLACE) 100 MG capsule   Oral   Take 1 capsule (100 mg total) by mouth every 12 (twelve) hours.   60 capsule   0    BP 104/54  Pulse 71  Temp(Src) 98.1 F (36.7 C) (Oral)  Resp 19  SpO2 95%  LMP 01/16/2014  Physical Exam  Nursing note and vitals reviewed. Constitutional: She is oriented to person, place, and time. She appears well-developed and well-nourished. No distress.  HENT:  Head: Normocephalic and atraumatic.  Eyes: Pupils are equal, round, and reactive to light.  Neck: Neck supple.  Cardiovascular: Normal rate, regular rhythm and normal heart sounds.   No murmur heard. Pulmonary/Chest: Effort normal and breath sounds normal. No respiratory distress. She has no wheezes.  Abdominal:  Soft. Bowel sounds are normal. She exhibits no distension. There is no tenderness. There is no rebound.  Genitourinary:  Rectal exam small amount of blood on the low; tenderness to palpation 6:00 with small fissure noted, no hemorrhoids  Musculoskeletal: She exhibits no edema.  Neurological: She is alert and oriented to person, place, and time.  Skin: Skin is warm and dry.  Psychiatric: She has a normal mood and affect.    ED Course  Procedures (including critical care time) DIAGNOSTIC STUDIES: Oxygen Saturation is 95% on RA, adequate by my interpretation.    COORDINATION OF CARE: 2:22 PM Discussed treatment plan which includes hemocult with pt at bedside and pt agreed to plan.    Labs Review Labs Reviewed  COMPREHENSIVE METABOLIC PANEL - Abnormal; Notable for the following:    Glucose, Bld 119 (*)    Total Bilirubin 0.2 (*)    All other components within normal limits  POC OCCULT BLOOD, ED - Abnormal; Notable for the following:    Fecal Occult Bld POSITIVE (*)    All other components within normal limits  CBC WITH DIFFERENTIAL   Imaging Review No results found.  EKG Interpretation   None       MDM   Final diagnoses:  Acute anal fissure    Patient presents with rectal bleeding with bowel movements. She is nontoxic-appearing on exam. She did recently have a fall but has no external evidence of trauma to the abdomen or flank. Rectal exam is notable for small fissure. Hemoccult is positive. Patient's hemoglobin is stable and within normal limits. Abdomen is nontender and non-peritonitic.  Patient was advised to use sitz baths and stool softeners. She's to followup with primary care physician. She's given strict return precautions.  After history, exam, and medical workup I feel the patient has been appropriately medically screened and is safe for discharge home. Pertinent diagnoses were discussed with the patient. Patient was given return precautions.    I personally  performed the services described in this documentation, which was scribed in my presence. The recorded information has been reviewed and is accurate.   Shon Batonourtney F Osha Rane, MD 02/05/14 [phone]

## 2014-02-04 NOTE — ED Notes (Signed)
Pt c/o rectal bleeding since Monday with clots. Pt states fell into a grave Monday. Pt has right arm in sling. Pt c/o right side "knot" to rib cage. Seen pcp for it and states pcp thinks it is a muscle "knotted up". Blood with bm's only. Color wnl. nad at this time.

## 2014-02-07 NOTE — Telephone Encounter (Signed)
Message received, and call returned, Monday, 02/07/14, from [facility], Workers Comp insurer representative [employer] MartinFrank Marino, [place][phone],Ext [phone].  Reached his voice mail; left message for him to return call as soon as possible regarding approval/scheduling of this patient's appointment.  I also called back to employer contact, Sable FeilKathy Morrow, who states that insurance is still working on this case, and that we're all "still holding" for further response. Patient aware of status.

## 2014-02-08 NOTE — Telephone Encounter (Signed)
Received call back from Workers [employer] insurer, under [employer]; spoke with adjuster Leda RoysFrank Marino -  His direct ph# [phone], fax# [phone].  He is completing our set up form; appointment has been scheduled 02/10/14.  He is notifying patient of this information.

## 2014-02-10 ENCOUNTER — Ambulatory Visit: Payer: Self-pay | Admitting: Orthopedic Surgery

## 2014-02-14 ENCOUNTER — Encounter: Payer: Self-pay | Admitting: Orthopedic Surgery

## 2014-02-14 ENCOUNTER — Ambulatory Visit (INDEPENDENT_AMBULATORY_CARE_PROVIDER_SITE_OTHER): Payer: Worker's Compensation | Admitting: Orthopedic Surgery

## 2014-02-14 VITALS — BP 115/73 | Ht 69.0 in | Wt 235.0 lb

## 2014-02-14 DIAGNOSIS — M75101 Unspecified rotator cuff tear or rupture of right shoulder, not specified as traumatic: Secondary | ICD-10-CM

## 2014-02-14 DIAGNOSIS — S4980XA Other specified injuries of shoulder and upper arm, unspecified arm, initial encounter: Secondary | ICD-10-CM

## 2014-02-14 DIAGNOSIS — S4990XA Unspecified injury of shoulder and upper arm, unspecified arm, initial encounter: Secondary | ICD-10-CM

## 2014-02-14 DIAGNOSIS — S43429A Sprain of unspecified rotator cuff capsule, initial encounter: Secondary | ICD-10-CM

## 2014-02-14 DIAGNOSIS — S46909A Unspecified injury of unspecified muscle, fascia and tendon at shoulder and upper arm level, unspecified arm, initial encounter: Secondary | ICD-10-CM

## 2014-02-14 HISTORY — DX: Unspecified rotator cuff tear or rupture of right shoulder, not specified as traumatic: M75.101

## 2014-02-14 HISTORY — DX: Unspecified injury of shoulder and upper arm, unspecified arm, initial encounter: S49.90XA

## 2014-02-14 MED ORDER — HYDROCODONE-ACETAMINOPHEN 5-325 MG PO TABS: 1.0000 | ORAL_TABLET | ORAL | Status: DC | PRN

## 2014-02-14 MED ORDER — IBUPROFEN 600 MG PO TABS: 600.0000 mg | ORAL_TABLET | Freq: Four times a day (QID) | ORAL | Status: DC | PRN

## 2014-02-14 NOTE — Progress Notes (Signed)
Patient ID: Alexandria PeltMichelle M Mejia, female   DOB: Aug 16, 1971, 43 y.o.   MRN: [id number]  Chief Complaint  Patient presents with  . Pain    Right arm and shoulder pain d/t right humerus  injury 01/31/2014   HISTORY: 43 year old female employed at the [place] fell into an empty grade onto her right arm and shoulder complains of right shoulder and right forearm pain initial films were negative. Her date of injury is reported as Monday the 16th of 2015 in February. She complains of throbbing stabbing burning pain 7-9/10 pain which is constant associated with numbness tingling locking catching bruising and swelling. Review of systems shortness of breath wheezing cough snoring heartburn swelling muscle pain all the systems were normal  Medical history no allergies she's had surgery on her neck her gallbladder her appendix her knee she's currently taking hydrocodone and ibuprofen for her shoulder injury she has a family history of arthritis and diabetes  She is single and she works as a Chartered certified accountantgrave digger and maintenance she smokes daily does not drink her at school education was completed through the 12th grade   Vital signs:   General the patient is well-developed and well-nourished grooming and hygiene are normal Oriented x3 Mood and affect normal Ambulation normal  Inspection of the right shoulder is diffusely tender she has 45 of external rotation is painful abduction to 70 painful for elevation 45 she has tenderness anteriorly posteriorly superiorly and pain also with extension of her elbow without deformity bruising or swelling in the lower part of the arm Full range of motion Lower joints of the arm are stable Motor exam is normal in the lower part of the arm Skin clean dry and intact  Cardiovascular exam is normal Sensory exam normal  X-rays of the shoulder were negative  Diagnosis contusion or rotator cuff tear  To expedite the treatment and return to work for this patient I strongly  recommend she have an MRI to rule out a cuff tear if the cuff was not torn that we can be more aggressive in her treatment.

## 2014-02-14 NOTE — Patient Instructions (Signed)
OOW until MRI MRI needs to be approved by workers comp.

## 2014-02-14 NOTE — Telephone Encounter (Signed)
Appointment was scheduled 02/10/14; patient aware.  * Note, due to weather, appointment re-scheduled to: 02/14/14, 2:45pm - patient aware.  * Patient seen as scheduled.

## 2014-02-16 ENCOUNTER — Telehealth: Payer: Self-pay | Admitting: Orthopedic Surgery

## 2014-02-16 NOTE — Telephone Encounter (Signed)
Contacted [employer], Workers Emergency planning/management officerComp insurer, attention Lockheed MartinFrank Marino, direct ph# [phone], fax# [phone]. Left voice message and Faxed medical records for review and for authorization regarding MRI as per order 02/14/14.  Patient aware of status.

## 2014-02-17 ENCOUNTER — Other Ambulatory Visit: Payer: Self-pay | Admitting: *Deleted

## 2014-02-17 ENCOUNTER — Telehealth: Payer: Self-pay | Admitting: *Deleted

## 2014-02-17 DIAGNOSIS — M75101 Unspecified rotator cuff tear or rupture of right shoulder, not specified as traumatic: Secondary | ICD-10-CM

## 2014-02-17 NOTE — Telephone Encounter (Signed)
02/17/14 Received call from Workers Comp Nurse case manager Acey LavSherry McCormick - states MRI is approved. Her direct ph#  Is [phone], Ext [phone] / Fax # [phone].  - We also received scheduling notification for MRI appointment, which has been scheduled at [facility], [address], [place], [place] [phone], fax [phone], request orders.  Faxed as requested, And notified patient.  Scheduled appointment to follow up here w/results.

## 2014-02-17 NOTE — Telephone Encounter (Signed)
Patient called requesting medication to go through MRI. The [facility] has to be [facility] at [facility] due to workers comp. Please advise.

## 2014-02-18 ENCOUNTER — Other Ambulatory Visit: Payer: Self-pay | Admitting: *Deleted

## 2014-02-18 DIAGNOSIS — F40240 Claustrophobia: Secondary | ICD-10-CM

## 2014-02-18 MED ORDER — DIAZEPAM 5 MG PO TABS: 5.0000 mg | ORAL_TABLET | ORAL | Status: DC

## 2014-02-18 NOTE — Telephone Encounter (Signed)
Ordered valium 5 mg one tablet to take 30 minutes before procedure no refills per DR. Romeo AppleHarrison.

## 2014-02-18 NOTE — Telephone Encounter (Signed)
We use valium 5 mg 30 minutes before mri or 0.5 mg xanax   Give her valium but I ll have to sign Monday

## 2014-03-01 ENCOUNTER — Encounter: Payer: Self-pay | Admitting: Orthopedic Surgery

## 2014-03-01 ENCOUNTER — Ambulatory Visit (INDEPENDENT_AMBULATORY_CARE_PROVIDER_SITE_OTHER): Payer: Worker's Compensation | Admitting: Orthopedic Surgery

## 2014-03-01 VITALS — BP 124/74 | Ht 69.0 in | Wt 235.0 lb

## 2014-03-01 DIAGNOSIS — M75110 Incomplete rotator cuff tear or rupture of unspecified shoulder, not specified as traumatic: Secondary | ICD-10-CM

## 2014-03-01 DIAGNOSIS — S43429A Sprain of unspecified rotator cuff capsule, initial encounter: Secondary | ICD-10-CM

## 2014-03-01 DIAGNOSIS — S46909A Unspecified injury of unspecified muscle, fascia and tendon at shoulder and upper arm level, unspecified arm, initial encounter: Secondary | ICD-10-CM

## 2014-03-01 DIAGNOSIS — S4990XA Unspecified injury of shoulder and upper arm, unspecified arm, initial encounter: Secondary | ICD-10-CM

## 2014-03-01 DIAGNOSIS — S4980XA Other specified injuries of shoulder and upper arm, unspecified arm, initial encounter: Secondary | ICD-10-CM

## 2014-03-01 HISTORY — DX: Incomplete rotator cuff tear or rupture of unspecified shoulder, not specified as traumatic: M75.110

## 2014-03-01 MED ORDER — METHOCARBAMOL 500 MG PO TABS: 500.0000 mg | ORAL_TABLET | Freq: Four times a day (QID) | ORAL | Status: DC

## 2014-03-01 MED ORDER — HYDROCODONE-ACETAMINOPHEN 5-325 MG PO TABS: 1.0000 | ORAL_TABLET | ORAL | Status: DC | PRN

## 2014-03-01 NOTE — Progress Notes (Signed)
Patient ID: [name], female   DOB: [birth year], 43 y.o.   MRN: [id number]  Chief Complaint  Patient presents with  . Follow-up    MRI Results DOI 01/31/14   Encounter Diagnoses  Name Primary?  . Partial tear of rotator cuff Yes  . Shoulder injury     BP 124/74  Ht 5\' 9"  (1.753 m)  Wt 235 lb (106.595 kg)  BMI 34.69 kg/m2  LMP 01/16/2014  MRI review. I reviewed the MRI with the radiologist the patient is a small articular surface peripheral tear of the supraspinatus tendon  There are some other degenerative changes unrelated to the injury.  I think this can be treated nonoperatively  She is complaining of some some trapezius muscle spasms which we will treat with Robaxin  Recommend physical therapy for 6 weeks and then repeat evaluation  Recommend continue hydrocodone and continue ibuprofen

## 2014-03-01 NOTE — Patient Instructions (Signed)
The MRI shows a small partial tear of the rotator cuff.  This is usually treated with therapy and medication  At this time I do not fell surgery is needed   I am recommending: Physical therapy Oral pain medication and antiinflammatory No work for 6 weeks

## 2014-03-02 ENCOUNTER — Telehealth: Payer: Self-pay | Admitting: Orthopedic Surgery

## 2014-03-02 NOTE — Telephone Encounter (Signed)
Received call back from Ms. Kathlene NovemberMcCormick, nurse case Production designer, theatre/television/filmmanager.  She is Authorizing the physical therapy for patient, and has sent a request to their 3rd party contact for therapy services, [facility], who will do the scheduling.  Await response from this facility so that we can forward the order.

## 2014-03-02 NOTE — Telephone Encounter (Signed)
Notes for date of service 03/01/14, including work note, faxed to Workers Comp carrier [employer], attention to nurse case manager Ian MalkinSherri McCormick to Fax# [phone]/ Ph# [phone], And copy to adjuster Leda RoysFrank Marino Fax# [phone]/Ph# [phone] - Requested approval/authorization for physical therapy.  Patient aware of status.

## 2014-03-07 ENCOUNTER — Other Ambulatory Visit: Payer: Self-pay | Admitting: *Deleted

## 2014-03-07 DIAGNOSIS — M75110 Incomplete rotator cuff tear or rupture of unspecified shoulder, not specified as traumatic: Secondary | ICD-10-CM

## 2014-03-07 NOTE — Telephone Encounter (Signed)
Put order in for therapy.

## 2014-03-07 NOTE — Telephone Encounter (Signed)
Per follow up calls - contacted [facility] [employer] Workers Comp - left voice mail message regarding location for physical therapy, for order to be entered.  Contacted patient directly since unable to reach Ms. McCormick or Mr. Sofie HartiganMarino at Work Comp directly - patient states she was contacted through "[facility]", which is the 3rd party scheduling -- she has been approved for therapy as previously noted, and scheduled at [facility], 9:00am tomorrow, 03/08/14. [facility]([place] office, Ph [phone] / Fax [phone])  Route to nurse for order.

## 2014-03-14 ENCOUNTER — Telehealth: Payer: Self-pay | Admitting: Orthopedic Surgery

## 2014-03-14 NOTE — Telephone Encounter (Signed)
Gwinda PasseMichelle Siegrist asked for a prescription for Hydrocodone 5/325. Her Phone # [phone]

## 2014-03-15 ENCOUNTER — Other Ambulatory Visit: Payer: Self-pay | Admitting: Orthopedic Surgery

## 2014-03-15 DIAGNOSIS — M75110 Incomplete rotator cuff tear or rupture of unspecified shoulder, not specified as traumatic: Secondary | ICD-10-CM

## 2014-03-15 DIAGNOSIS — S4990XA Unspecified injury of shoulder and upper arm, unspecified arm, initial encounter: Secondary | ICD-10-CM

## 2014-03-15 MED ORDER — HYDROCODONE-ACETAMINOPHEN 5-325 MG PO TABS: 1.0000 | ORAL_TABLET | ORAL | Status: DC | PRN

## 2014-03-15 NOTE — Telephone Encounter (Signed)
APPROVED

## 2014-03-15 NOTE — Telephone Encounter (Signed)
Routing to Dr Harrison 

## 2014-03-15 NOTE — Telephone Encounter (Signed)
Prescription ready to be picked up, unable to reach patient

## 2014-03-16 ENCOUNTER — Encounter: Payer: Self-pay | Admitting: Orthopedic Surgery

## 2014-03-21 ENCOUNTER — Telehealth: Payer: Self-pay | Admitting: Orthopedic Surgery

## 2014-03-21 NOTE — Telephone Encounter (Signed)
Alexandria Mejia asked  the nurse to call her.  Last Thursday she stretched and felt a "pop" in her shoulder.  She told the therapist about this and she told her some  Exercises to stop.  Still having some burning down the back of her elbow into forearm.  Wants to discuss this with nurse.  Her # [phone]

## 2014-03-22 NOTE — Telephone Encounter (Signed)
Continued 03/22/14 Patient states the therapist has given her some mild exercises. I advised that she should continue that,ice if needed, and continue her ibuprofen, and if she feels something is still not feeling right by Friday, she can call our office and we can move her appointment up. She is scheduled to come in and see Dr. Romeo AppleHarrison on 04/12/14.

## 2014-03-22 NOTE — Telephone Encounter (Signed)
Returned call to patient and could not leave a message, due to voicemail was not set up.

## 2014-03-22 NOTE — Telephone Encounter (Signed)
Spoke with patient and she stated she was feeling some better, however, she did still feel some burning,

## 2014-04-12 ENCOUNTER — Ambulatory Visit (INDEPENDENT_AMBULATORY_CARE_PROVIDER_SITE_OTHER): Payer: Worker's Compensation | Admitting: Orthopedic Surgery

## 2014-04-12 ENCOUNTER — Encounter: Payer: Self-pay | Admitting: Orthopedic Surgery

## 2014-04-12 VITALS — BP 116/80 | Ht 69.0 in | Wt 235.0 lb

## 2014-04-12 DIAGNOSIS — S4980XA Other specified injuries of shoulder and upper arm, unspecified arm, initial encounter: Secondary | ICD-10-CM

## 2014-04-12 DIAGNOSIS — S4990XA Unspecified injury of shoulder and upper arm, unspecified arm, initial encounter: Secondary | ICD-10-CM

## 2014-04-12 DIAGNOSIS — S46909A Unspecified injury of unspecified muscle, fascia and tendon at shoulder and upper arm level, unspecified arm, initial encounter: Secondary | ICD-10-CM

## 2014-04-12 DIAGNOSIS — M75110 Incomplete rotator cuff tear or rupture of unspecified shoulder, not specified as traumatic: Secondary | ICD-10-CM

## 2014-04-12 DIAGNOSIS — S43429A Sprain of unspecified rotator cuff capsule, initial encounter: Secondary | ICD-10-CM

## 2014-04-12 MED ORDER — HYDROCODONE-ACETAMINOPHEN 5-325 MG PO TABS: 1.0000 | ORAL_TABLET | ORAL | Status: DC | PRN

## 2014-04-12 NOTE — Progress Notes (Signed)
Patient ID: Alexandria PeltMichelle M Mejia, female   DOB: June 10, 1971, 43 y.o.   MRN: [id number]   Chief Complaint  Patient presents with  . Follow-up    6 week recheck Right Shoulder s/p therapy    This is actually a followup for workers compensation injury from every 16 2015 MRI shows a rim rent articular surface partial tear rotator cuff patient has good passive range of motion but still does not have active forward elevation and now complains of numbness and tingling in the index finger and thumb which started 2-3 weeks after her rehabilitation started  Her rehabilitation is gone well but she has not regained strength in the abduction of forward elevation position exhibited that weakness today with the passive range of motion normal and painful and 120 of forward elevation  Recommend nerve conduction study to delineate the numbness and tingling in the index finger and then return and if she does not have full forward elevation actively recommend arthroscopic cuff repair expected time out of work after that for 3 months  Continue to work status at this time

## 2014-04-12 NOTE — Patient Instructions (Addendum)
Referral for Nerve conduction study Workers comp approval OOW NOTE

## 2014-04-13 ENCOUNTER — Telehealth: Payer: Self-pay | Admitting: Orthopedic Surgery

## 2014-04-13 NOTE — Telephone Encounter (Signed)
Per office visit date of service 04/12/14, spoke with Workers Comp, [employer], nurse case manager Ian MalkinSherri McCormick, ph# [phone] -- notes faxed for update, and to request approval  - continuation of physical therapy at [facility] [place] * per verbal authorization, already approved for 12 additional therapy visits  - NCS (nerve conduction study)  --Faxed notes to attention Ian MalkinSherri McCormick at fax# [phone]             --Faxed order to [facility] fax# [phone] Patient aware.

## 2014-04-14 NOTE — Telephone Encounter (Signed)
Patient was seen by Dr. Romeo AppleHarrison 04/12/14

## 2014-04-19 ENCOUNTER — Other Ambulatory Visit: Payer: Self-pay | Admitting: *Deleted

## 2014-04-19 DIAGNOSIS — R202 Paresthesia of skin: Principal | ICD-10-CM

## 2014-04-19 DIAGNOSIS — R200 Anesthesia of skin: Secondary | ICD-10-CM

## 2014-04-20 NOTE — Telephone Encounter (Signed)
Workers Comp has approved through [employer], for patient to have NCS/EMG, at [facility], Ph# [phone], Fax# [phone], and appointment has been scheduled for 04/22/14, 9:15am, states patient has been contacted. I also left a message for patient at her alternate ph# [phone], as she had recently relayed that her phone # is out of minutes.

## 2014-04-25 NOTE — Telephone Encounter (Signed)
[facility] as scheduled, for nerve conduction study.  Follow up appointment here, for review of results has been scheduled; patient aware.

## 2014-04-28 ENCOUNTER — Ambulatory Visit (INDEPENDENT_AMBULATORY_CARE_PROVIDER_SITE_OTHER): Payer: Worker's Compensation | Admitting: Orthopedic Surgery

## 2014-04-28 ENCOUNTER — Encounter: Payer: Self-pay | Admitting: Orthopedic Surgery

## 2014-04-28 DIAGNOSIS — S4990XA Unspecified injury of shoulder and upper arm, unspecified arm, initial encounter: Secondary | ICD-10-CM

## 2014-04-28 DIAGNOSIS — M75110 Incomplete rotator cuff tear or rupture of unspecified shoulder, not specified as traumatic: Secondary | ICD-10-CM

## 2014-04-28 DIAGNOSIS — S4980XA Other specified injuries of shoulder and upper arm, unspecified arm, initial encounter: Secondary | ICD-10-CM

## 2014-04-28 DIAGNOSIS — S46909A Unspecified injury of unspecified muscle, fascia and tendon at shoulder and upper arm level, unspecified arm, initial encounter: Secondary | ICD-10-CM

## 2014-04-28 DIAGNOSIS — S43429A Sprain of unspecified rotator cuff capsule, initial encounter: Secondary | ICD-10-CM

## 2014-04-28 MED ORDER — HYDROCODONE-ACETAMINOPHEN 5-325 MG PO TABS: 1.0000 | ORAL_TABLET | ORAL | Status: DC | PRN

## 2014-04-28 NOTE — Patient Instructions (Signed)
W/C case SARS RCR

## 2014-04-28 NOTE — Progress Notes (Signed)
Patient ID: Alexandria Mejia, female   DOB: 30-Jul-1971, 43 y.o.   MRN: [id number]  Followup nerve conduction study done and looked to nerve study was normal  Patient still having pain  Partial articular surface rotator cuff tear had therapy not better on hydrocodone still in sling  Still out of work  MRI shows partial-thickness tear of the supraspinous tendon  Recommend repair  Patient will have surgery to discuss surgery treatment options and the type of surgery that needed.  Recommend obtain Worker's Compensation approval. Schedule surgery once we get that approval.  Followup postop visit set up later.

## 2014-05-02 ENCOUNTER — Telehealth: Payer: Self-pay | Admitting: Orthopedic Surgery

## 2014-05-02 NOTE — Telephone Encounter (Signed)
Called, and also faxed notes, to [employer], [employer],  Attention: Ian MalkinSherri McCormick, nurse case manager, ph# [phone], fax# [phone], with request for pre-authorization for surgery CPT [phone], diagnosis codes 959.2, 840.4 - requested approval for [facility] day [facility].  Per voice mail, Roanna RaiderSherri is out of office until tomorrow, 05/03/14. Awaiting response.  Patient aware of status.

## 2014-05-03 NOTE — Telephone Encounter (Signed)
05/03/14 called back to Workers Comp; reached W.W. Grainger IncSherri McCormick's voice mail again; left a follow up message for a return call in regard to request for approval.  Patient aware of status.

## 2014-05-04 NOTE — Telephone Encounter (Signed)
Due to no response from nurse case manager Sherri as of yet, called back to Workers comp Barrister's clerkadjuster, [employer]; reached him at (same) general ph# [phone], ext [phone], and he advised to also fax to him, direct #[phone]. Done.

## 2014-05-05 NOTE — Telephone Encounter (Signed)
Received call and faxed note in response - per Ian MalkinSherri McCormick, our request was received and is still in review with Utilization management -- states to allow approximately 1 week more.  Patient was contacted, and aware of status.

## 2014-05-10 ENCOUNTER — Telehealth: Payer: Self-pay | Admitting: Orthopedic Surgery

## 2014-05-10 NOTE — Telephone Encounter (Signed)
Copy of recent notes, date of service 04/28/14, including work status note faxed to short-term disability Engineer, materials and Chief Strategy Officer to local office fax of [phone] (patient hand-carried a copy to local offices (main fax# [phone].

## 2014-05-11 NOTE — Telephone Encounter (Signed)
05/11/14 Received acknowledgement of pre-authorization request, by fax, Ref# [id number], from [facility],  stating that a written determination will be made as soon as practicable, not to exceed 7 business days, unless otherwise agreed upon by insurer and medical provider.  Their direct contact ph# if questions is: [phone].

## 2014-05-12 NOTE — Telephone Encounter (Signed)
Received both written Authorization in fax and verbal Authorization per Ian Malkin, Workers comp nurse case manager - shoulder surgery approved.  Patient being contacted at this time by Workers comp to notify.  All are aware that our office will contact with a surgery date upon scheduling.

## 2014-05-13 ENCOUNTER — Other Ambulatory Visit: Payer: Self-pay | Admitting: *Deleted

## 2014-05-13 ENCOUNTER — Encounter (HOSPITAL_COMMUNITY): Payer: Self-pay | Admitting: Pharmacy Technician

## 2014-05-13 NOTE — Telephone Encounter (Signed)
Surgery scheduled for 05/27/14

## 2014-05-17 ENCOUNTER — Other Ambulatory Visit: Payer: Self-pay | Admitting: Orthopedic Surgery

## 2014-05-17 DIAGNOSIS — M75110 Incomplete rotator cuff tear or rupture of unspecified shoulder, not specified as traumatic: Secondary | ICD-10-CM

## 2014-05-17 DIAGNOSIS — S4990XA Unspecified injury of shoulder and upper arm, unspecified arm, initial encounter: Secondary | ICD-10-CM

## 2014-05-17 MED ORDER — HYDROCODONE-ACETAMINOPHEN 5-325 MG PO TABS: 1.0000 | ORAL_TABLET | ORAL | Status: DC | PRN

## 2014-05-17 NOTE — Telephone Encounter (Signed)
Patient aware of surgery, pre-op, and post op appointments. Workers Comp notified of dates as well.

## 2014-05-20 NOTE — Patient Instructions (Signed)
Alexandria Mejia  05/20/2014   Your procedure is scheduled on:  05/27/2014  Report to [facility] at  900  AM.  Call this number if you have problems the morning of surgery: [phone]   Remember:   Do not eat food or drink liquids after midnight.   Take these medicines the morning of surgery with A SIP OF WATER:  hydrocodone   Do not wear jewelry, make-up or nail polish.  Do not wear lotions, powders, or perfumes.   Do not shave 48 hours prior to surgery. Men may shave face and neck.  Do not bring valuables to the hospital.  [facility] is not responsible for any belongings or valuables.               Contacts, dentures or bridgework may not be worn into surgery.  Leave suitcase in the car. After surgery it may be brought to your room.  For patients admitted to the hospital, discharge time is determined by your treatment team.               Patients discharged the day of surgery will not be allowed to drive home.  Name and phone number of your driver: family  Special Instructions: Shower using CHG 2 nights before surgery and the night before surgery.  If you shower the day of surgery use CHG.  Use special wash - you have one bottle of CHG for all showers.  You should use approximately 1/3 of the bottle for each shower.   Please read over the following fact sheets that you were given: Pain Booklet, Coughing and Deep Breathing, Surgical Site Infection Prevention, Anesthesia Post-op Instructions and Care and Recovery After Surgery Rotator Cuff Injury Rotator cuff injury is any type of injury to the set of muscles and tendons that make up the stabilizing unit of your shoulder. This unit holds the ball of your upper arm bone (humerus) in the socket of your shoulder blade (scapula).  CAUSES Injuries to your rotator cuff most commonly come from sports or activities that cause your arm to be moved repeatedly over your head. Examples of this include throwing, weight lifting,  swimming, or racquet sports. Long lasting (chronic) irritation of your rotator cuff can cause soreness and swelling (inflammation), bursitis, and eventual damage to your tendons, such as a tear (rupture). SIGNS AND SYMPTOMS Acute rotator cuff tear:  Sudden tearing sensation followed by severe pain shooting from your upper shoulder down your arm toward your elbow.  Decreased range of motion of your shoulder because of pain and muscle spasm.  Severe pain.  Inability to raise your arm out to the side because of pain and loss of muscle power (large tears). Chronic rotator cuff tear:  Pain that usually is worse at night and may interfere with sleep.  Gradual weakness and decreased shoulder motion as the pain worsens.  Decreased range of motion. Rotator cuff tendinitis:  Deep ache in your shoulder and the outside upper arm over your shoulder.  Pain that comes on gradually and becomes worse when lifting your arm to the side or turning it inward. DIAGNOSIS Rotator cuff injury is diagnosed through a medical history, physical exam, and imaging exam. The medical history helps determine the type of rotator cuff injury. Your health care provider will look at your injured shoulder, feel the injured area, and ask you to move your shoulder in different positions. X-ray exams typically are done to rule out other  causes of shoulder pain, such as fractures. MRI is the exam of choice for the most severe shoulder injuries because the images show muscles and tendons.  TREATMENT  Chronic tear:  Medicine for pain, such as acetaminophen or ibuprofen.  Physical therapy and range-of-motion exercises may be helpful in maintaining shoulder function and strength.  Steroid injections into your shoulder joint.  Surgical repair of the rotator cuff if the injury does not heal with noninvasive treatment. Acute tear:  Anti-inflammatory medicines such as ibuprofen and naproxen to help reduce pain and swelling.  A  sling to help support your arm and rest your rotator cuff muscles. Long-term use of a sling is not advised. It may cause significant stiffening of the shoulder joint.  Surgery may be considered within a few weeks, especially in younger, active people, to return the shoulder to full function.  Indications for surgical treatment include the following:  Age younger than 60 years.  Rotator cuff tears that are complete.  Physical therapy, rest, and anti-inflammatory medicines have been used for 6 8 weeks, with no improvement.  Employment or sporting activity that requires constant shoulder use. Tendinitis:  Anti-inflammatory medicines such as ibuprofen and naproxen to help reduce pain and swelling.  A sling to help support your arm and rest your rotator cuff muscles. Long-term use of a sling is not advised. It may cause significant stiffening of the shoulder joint.  Severe tendinitis may require:  Steroid injections into your shoulder joint.  Physical therapy.  Surgery. HOME CARE INSTRUCTIONS   Apply ice to your injury:  Put ice in a plastic bag.  Place a towel between your skin and the bag.  Leave the ice on for 20 minutes, 2 3 times a day.  If you have a shoulder immobilizer (sling and straps), wear it until told otherwise by your health care provider.  You may want to sleep on several pillows or in a recliner at night to lessen swelling and pain.  Only take over-the-counter or prescription medicines for pain, discomfort, or fever as directed by your health care provider.  Do simple hand squeezing exercises with a soft rubber ball to decrease hand swelling. SEEK MEDICAL CARE IF:   Your shoulder pain increases, or new pain or numbness develops in your arm, hand, or fingers.  Your hand or fingers are colder than your other hand. SEEK IMMEDIATE MEDICAL CARE IF:   Your arm, hand, or fingers are numb or tingling.  Your arm, hand, or fingers are increasingly swollen and  painful, or they turn white or blue. MAKE SURE YOU:  Understand these instructions.  Will watch your condition.  Will get help right away if you are not doing well or get worse. Document Released: 11/29/2000 Document Revised: 09/22/2013 Document Reviewed: 07/14/2013 [facility] Patient Information 2014 [place], [place]. Shoulder Arthroscopy Because the shoulder is one of the most mobile joints, it is more prone to injury. It is a very shallow ball and socket joint located between the large bone in your upper arm (humerus) and the shoulder blade (scapula). Arthroscopy is a valuable test for evaluating and treating injuries involving the shoulder joint. Arthroscopy is a surgical technique which uses small incisions (cuts by the surgeon) to insert a small telescope like instrument (arthroscope) and other tools into the shoulder. This allows the surgeon to look directly at the problem. When the arthroscope is in the joint, fluid is used to expand the joint space. This allows the surgeon to examine it more easily. The arthroscope  then beams light into the joint and sends an image to a TV screen. As your surgeon examines your shoulder, he or she can also repair a number of problems found at the same time. Sometimes the procedure may change to an open surgery. This would happen if the problems are severe enough that they cannot be corrected with just arthroscopy. This is usually a very safe surgery. Rare complications include damage to nerves or blood vessels, excess bleeding, blood clots, infection, and rarely instrument failure. This is most often performed as a same day surgery. This means you will not have to stay in the hospital overnight. Recovery from this surgery is also much faster than having an open procedure. LET YOUR CAREGIVER KNOW ABOUT:  Allergies.  Medications taken including herbs, eye drops, over the counter medications, and creams.  Use of steroids (by mouth or creams).  Previous problems  with anesthetics or novocaine.  Possibility of pregnancy, if this applies.  History of blood clots (thrombophlebitis).  History of bleeding or blood problems.  Previous surgery.  Other health problems.  Family history of anesthetic problems. BEFORE THE PROCEDURE   Stop all anti-inflammatory medications at least one week before surgery unless instructed otherwise. Tell your surgeon if you have been taking cortisone or other steroids.  Do not eat or drink after midnight or as instructed. Take medications as directed by your caregiver. You may have lab tests the morning of surgery.  You should be present 60 minutes prior to your procedure or as directed. PROCEDURE  You may have general (go to sleep) or local (numb the area) anesthetic. Your surgeon will discuss this with you. During the procedure as discussed above, your surgeon may find a variety of problems which he or she can improve or correct using small instruments. When the procedure is finished the tiny incisions will be closed with stitches or tape. AFTER YOUR PROCEDURE  After surgery you will be taken to the recovery area. A nurse will watch and check your progress. Once you are awake, stable, and taking fluids well, barring other problems you will be allowed to go home.  Once home, apply an ice pack to your operative site for twenty minutes, three to four times per day, for two to three days. This may help with discomfort and keep the swelling down.  Use a sling and medications if prescribed or as instructed.  Unless your caregiver advises otherwise, move your arm and shoulder gently and frequently following the procedure. This can help prevent stiffness and swelling. REHABILITATION  Almost as important as your surgery is your rehabilitation. If physical therapy and exercises are prescribed by your surgeon, follow them diligently. Once comfortable and on your way to full use, do muscle strengthening exercises as  instructed.  Only take over-the-counter or prescription medicines for pain, discomfort, or fever as directed by your caregiver. SEEK IMMEDIATE MEDICAL CARE IF:   There is redness, swelling, or increasing pain in the wound or joint.  You notice purulent (colored- pus-like) drainage coming from the wound.  An unexplained oral temperature above 102 F (38.9 C) develops.  You notice a foul smell coming from the wound or dressing.  There is a breaking open of the wound. The edges do not stay together after sutures or tape has been removed.  Persistent bleeding from the small incision. Document Released: 11/29/2000 Document Revised: 02/24/2012 Document Reviewed: 03/20/2009 [facility] Patient Information 2014 [place]. PATIENT INSTRUCTIONS POST-ANESTHESIA  IMMEDIATELY FOLLOWING SURGERY:  Do not drive or  operate machinery for the first twenty four hours after surgery.  Do not make any important decisions for twenty four hours after surgery or while taking narcotic pain medications or sedatives.  If you develop intractable nausea and vomiting or a severe headache please notify your doctor immediately.  FOLLOW-UP:  Please make an appointment with your surgeon as instructed. You do not need to follow up with anesthesia unless specifically instructed to do so.  WOUND CARE INSTRUCTIONS (if applicable):  Keep a dry clean dressing on the anesthesia/puncture wound site if there is drainage.  Once the wound has quit draining you may leave it open to air.  Generally you should leave the bandage intact for twenty four hours unless there is drainage.  If the epidural site drains for more than 36-48 hours please call the anesthesia department.  QUESTIONS?:  Please feel free to call your physician or the hospital operator if you have any questions, and they will be happy to assist you.

## 2014-05-23 ENCOUNTER — Encounter (HOSPITAL_COMMUNITY): Payer: Self-pay

## 2014-05-23 ENCOUNTER — Encounter (HOSPITAL_COMMUNITY)
Admission: RE | Admit: 2014-05-23 | Discharge: 2014-05-23 | Disposition: A | Discharge status: Home / Self Care | Payer: BC Managed Care – PPO | Source: Ambulatory Visit | Attending: Orthopedic Surgery | Admitting: Orthopedic Surgery

## 2014-05-23 DIAGNOSIS — Z01818 Encounter for other preprocedural examination: Secondary | ICD-10-CM | POA: Insufficient documentation

## 2014-05-23 DIAGNOSIS — Z01810 Encounter for preprocedural cardiovascular examination: Secondary | ICD-10-CM | POA: Insufficient documentation

## 2014-05-23 DIAGNOSIS — Z01812 Encounter for preprocedural laboratory examination: Secondary | ICD-10-CM | POA: Insufficient documentation

## 2014-05-23 HISTORY — DX: Adverse effect of unspecified anesthetic, initial encounter: T41.45XA

## 2014-05-23 HISTORY — DX: Other complications of anesthesia, initial encounter: T88.59XA

## 2014-05-23 LAB — BASIC METABOLIC PANEL
BUN: 11 mg/dL (ref 6–23)
CHLORIDE: 103 meq/L (ref 96–112)
CO2: 26 mEq/L (ref 19–32)
CREATININE: 0.63 mg/dL (ref 0.50–1.10)
Calcium: 9.5 mg/dL (ref 8.4–10.5)
GFR calc non Af Amer: >90 mL/min (ref >90)
Glucose, Bld: 157 mg/dL — ABNORMAL HIGH (ref 70–99)
POTASSIUM: 4.5 meq/L (ref 3.7–5.3)
Sodium: 142 mEq/L (ref 137–147)

## 2014-05-23 LAB — HEMOGLOBIN AND HEMATOCRIT, BLOOD
HEMATOCRIT: 41.2 % (ref 36.0–46.0)
Hemoglobin: 14.1 g/dL (ref 12.0–15.0)

## 2014-05-23 LAB — HCG, SERUM, QUALITATIVE: Preg, Serum: NEGATIVE

## 2014-05-23 NOTE — Pre-Procedure Instructions (Signed)
Patient states history of "heartrate fluttering and irregular heartbeat." EKG shows SBrady rat 54. Dr Jayme Cloud aware, EKG strip to be run on arrival.

## 2014-05-23 NOTE — Pre-Procedure Instructions (Signed)
Patient given information to sign up for my chart at home. 

## 2014-05-26 NOTE — H&P (Signed)
Chief Complaint   Patient presents with   .  Pain       Right arm and shoulder pain d/t right humerus  injury 01/31/2014    HISTORY: 43 year old female [place] [employer] fell into an empty grade onto her right arm and shoulder complains of right shoulder and right forearm pain initial films were negative. Her date of injury is reported as Monday the 16th of 2015 in February. She complains of throbbing stabbing burning pain 7-9/10 pain which is constant associated with numbness tingling locking catching bruising and swelling.   She was treated with physical therapy, rest, sling, anti-inflammatories and oral analgesics. She did not improve. She had an MRI which showed an anterior rim rent tear of the supraspinatus tendon. 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  Because she has not improved after adequate physical therapy and the pain and dysfunction are preventing her from returning to her normal functional status she agrees to accept the risks versus benefits of surgical intervention  Review of systems shortness of breath wheezing cough snoring heartburn swelling muscle pain all the systems were normal  No current facility-administered medications for this encounter. Current outpatient prescriptions:ibuprofen (ADVIL,MOTRIN) 600 MG tablet, Take 1 tablet (600 mg total) by mouth every 6 (six) hours as needed., Disp: 90 tablet, Rfl: 5;  HYDROcodone-acetaminophen (NORCO/VICODIN) 5-325 MG per tablet, Take 1 tablet by mouth every 4 (four) hours as needed for moderate pain., Disp: 84 tablet, Rfl: 0  Medical history no allergies she's had surgery on her neck her gallbladder her appendix her knee she's currently taking hydrocodone and ibuprofen for her shoulder injury she has a family history of arthritis and diabetes  She is single and she works as a Chartered certified accountantgrave digger and maintenance she smokes daily does not drink her at  school education was completed through the 12th grade  General appearance: Development, nutrition are normal. Body habitus large No gross deformities are noted and grooming normal.  Peripheral vascular system no swelling or varicose veins are noted and pulses are palpable without tenderness, temperature warm to touch no edema.  No palpable lymph nodes are noted in the cervical area or axillae.  The skin overlying the right and left shoulder cervical and thoracic spine is normal without rash, lesion or ulceration  Deep tendon reflexes are normal and equal. And pathologic reflexes such as Hoffman sign are negative.  Sensation remains normal.  The patient is oriented to person place and time, the mood and affect are normal  Ambulation remains normal  Cervical spine no mass or tenderness. Range of motion is normal. Muscle tone is normal. Skin is normal.   Left shoulder  inspection reveals no tenderness or malalignment. There is no crepitation. The range of motion remains full flexion internal and external rotation. Stability tests are normal in abduction external rotation inferior subluxation test as well as the posterior stress test. Manual muscle testing of the supraspinatus, internal and external rotators 5 over 5  Impingement sign is normal, Hawkins maneuver normal, a.c. joint stress test normal.  Right shoulder There is tenderness around the anterior deltoid and the posterior aspect of the acromion, internal rotation is limited, forward elevation is limited. External rotation is limited. The patient is stable in abduction external rotation. There is mild weakness of the supraspinatus tendon 4/5 with normal internal and external rotation strength are 5/5. The impingement sign is positive The a.c. joint stress test is normal  X-rays of the shoulder were negative  Again the MRI shows a remnant articular surface tear the supraspinatus at its anterior and distalmost insertion. There  is tendinopathy of the tendon as well. There is subscapularis tendinopathy as well there is some atrophy of the teres minor tendinopathy and tendinitis of the intra-articular portion of the biceps tendon with mild degenerative changes of the a.c. joint and poor definition of the rotator interval.  Diagnosis partial rotator cuff tear right shoulder  Plan arthroscopy and probable open rotator cuff repair

## 2014-05-27 ENCOUNTER — Encounter (HOSPITAL_COMMUNITY)
Admission: RE | Disposition: A | Discharge status: Home / Self Care | Payer: Self-pay | Source: Ambulatory Visit | Attending: Orthopedic Surgery

## 2014-05-27 ENCOUNTER — Ambulatory Visit (HOSPITAL_COMMUNITY): Payer: Worker's Compensation | Admitting: Anesthesiology

## 2014-05-27 ENCOUNTER — Encounter (HOSPITAL_COMMUNITY): Payer: Worker's Compensation | Admitting: Anesthesiology

## 2014-05-27 ENCOUNTER — Ambulatory Visit (HOSPITAL_COMMUNITY)
Admission: RE | Admit: 2014-05-27 | Discharge: 2014-05-27 | Disposition: A | Discharge status: Home / Self Care | Payer: Worker's Compensation | Source: Ambulatory Visit | Attending: Orthopedic Surgery | Admitting: Orthopedic Surgery

## 2014-05-27 ENCOUNTER — Encounter (HOSPITAL_COMMUNITY): Payer: Self-pay

## 2014-05-27 DIAGNOSIS — Y9289 Other specified places as the place of occurrence of the external cause: Secondary | ICD-10-CM | POA: Insufficient documentation

## 2014-05-27 DIAGNOSIS — M659 Unspecified synovitis and tenosynovitis, unspecified site: Secondary | ICD-10-CM | POA: Insufficient documentation

## 2014-05-27 DIAGNOSIS — W1789XA Other fall from one level to another, initial encounter: Secondary | ICD-10-CM | POA: Diagnosis not present

## 2014-05-27 DIAGNOSIS — S43429A Sprain of unspecified rotator cuff capsule, initial encounter: Secondary | ICD-10-CM | POA: Insufficient documentation

## 2014-05-27 DIAGNOSIS — M75101 Unspecified rotator cuff tear or rupture of right shoulder, not specified as traumatic: Secondary | ICD-10-CM

## 2014-05-27 DIAGNOSIS — F17200 Nicotine dependence, unspecified, uncomplicated: Secondary | ICD-10-CM | POA: Diagnosis not present

## 2014-05-27 DIAGNOSIS — M75110 Incomplete rotator cuff tear or rupture of unspecified shoulder, not specified as traumatic: Secondary | ICD-10-CM

## 2014-05-27 HISTORY — PX: SHOULDER ARTHROSCOPY WITH ROTATOR CUFF REPAIR: SHX5685

## 2014-05-27 SURGERY — ARTHROSCOPY, SHOULDER, WITH ROTATOR CUFF REPAIR
Anesthesia: General | Site: Shoulder | Laterality: Right

## 2014-05-27 MED ORDER — NEOSTIGMINE METHYLSULFATE 10 MG/10ML IV SOLN
INTRAVENOUS | Status: DC | PRN
  Administered 2014-05-27: 2 mg via INTRAVENOUS

## 2014-05-27 MED ORDER — MIDAZOLAM HCL 2 MG/2ML IJ SOLN
1.0000 mg | INTRAMUSCULAR | Status: DC | PRN
  Administered 2014-05-27 (×2): 2 mg via INTRAVENOUS

## 2014-05-27 MED ORDER — EPHEDRINE SULFATE 50 MG/ML IJ SOLN
INTRAMUSCULAR | Status: AC
  Filled 2014-05-27: qty 1

## 2014-05-27 MED ORDER — LIDOCAINE HCL (PF) 1 % IJ SOLN
INTRAMUSCULAR | Status: AC
  Filled 2014-05-27: qty 5

## 2014-05-27 MED ORDER — SODIUM CHLORIDE 0.9 % IJ SOLN
INTRAMUSCULAR | Status: AC
  Filled 2014-05-27: qty 10

## 2014-05-27 MED ORDER — HYDROCODONE-ACETAMINOPHEN 10-325 MG PO TABS: 1.0000 | ORAL_TABLET | Freq: Four times a day (QID) | ORAL | Status: DC | PRN

## 2014-05-27 MED ORDER — CHLORHEXIDINE GLUCONATE 4 % EX LIQD: 60.0000 mL | Freq: Once | CUTANEOUS | Status: DC

## 2014-05-27 MED ORDER — FENTANYL CITRATE 0.05 MG/ML IJ SOLN
INTRAMUSCULAR | Status: DC | PRN
  Administered 2014-05-27 (×4): 50 ug via INTRAVENOUS

## 2014-05-27 MED ORDER — ONDANSETRON HCL 4 MG/2ML IJ SOLN
4.0000 mg | Freq: Once | INTRAMUSCULAR | Status: AC
  Administered 2014-05-27: 4 mg via INTRAVENOUS
  Filled 2014-05-27: qty 2

## 2014-05-27 MED ORDER — LACTATED RINGERS IV SOLN
INTRAVENOUS | Status: DC
  Administered 2014-05-27 (×2): via INTRAVENOUS

## 2014-05-27 MED ORDER — GLYCOPYRROLATE 0.2 MG/ML IJ SOLN
INTRAMUSCULAR | Status: AC
  Filled 2014-05-27: qty 2

## 2014-05-27 MED ORDER — ROCURONIUM BROMIDE 100 MG/10ML IV SOLN
INTRAVENOUS | Status: DC | PRN
  Administered 2014-05-27: 15 mg via INTRAVENOUS
  Administered 2014-05-27: 35 mg via INTRAVENOUS
  Administered 2014-05-27: 5 mg via INTRAVENOUS

## 2014-05-27 MED ORDER — CEFAZOLIN SODIUM-DEXTROSE 2-3 GM-% IV SOLR
2.0000 g | INTRAVENOUS | Status: AC
  Administered 2014-05-27: 2 g via INTRAVENOUS
  Filled 2014-05-27: qty 50

## 2014-05-27 MED ORDER — SUCCINYLCHOLINE CHLORIDE 20 MG/ML IJ SOLN
INTRAMUSCULAR | Status: AC
  Filled 2014-05-27: qty 1

## 2014-05-27 MED ORDER — ROCURONIUM BROMIDE 50 MG/5ML IV SOLN
INTRAVENOUS | Status: AC
  Filled 2014-05-27: qty 1

## 2014-05-27 MED ORDER — BUPIVACAINE-EPINEPHRINE (PF) 0.5% -1:200000 IJ SOLN
INTRAMUSCULAR | Status: AC
  Filled 2014-05-27: qty 60

## 2014-05-27 MED ORDER — FENTANYL CITRATE 0.05 MG/ML IJ SOLN
INTRAMUSCULAR | Status: AC
  Filled 2014-05-27: qty 2

## 2014-05-27 MED ORDER — BUPIVACAINE-EPINEPHRINE 0.5% -1:200000 IJ SOLN
INTRAMUSCULAR | Status: DC | PRN
  Administered 2014-05-27: 60 mL

## 2014-05-27 MED ORDER — EPINEPHRINE HCL 1 MG/ML IJ SOLN
INTRAMUSCULAR | Status: AC
  Filled 2014-05-27: qty 7

## 2014-05-27 MED ORDER — ONDANSETRON HCL 4 MG/2ML IJ SOLN: 4.0000 mg | Freq: Once | INTRAMUSCULAR | Status: DC | PRN

## 2014-05-27 MED ORDER — SUCCINYLCHOLINE CHLORIDE 20 MG/ML IJ SOLN
INTRAMUSCULAR | Status: DC | PRN
  Administered 2014-05-27: 120 mg via INTRAVENOUS

## 2014-05-27 MED ORDER — ONDANSETRON HCL 4 MG/2ML IJ SOLN
4.0000 mg | Freq: Once | INTRAMUSCULAR | Status: AC
  Administered 2014-05-27: 4 mg via INTRAVENOUS

## 2014-05-27 MED ORDER — PROPOFOL 10 MG/ML IV BOLUS
INTRAVENOUS | Status: DC | PRN
  Administered 2014-05-27: 180 mg via INTRAVENOUS

## 2014-05-27 MED ORDER — PROMETHAZINE HCL 12.5 MG PO TABS: 12.5000 mg | ORAL_TABLET | Freq: Four times a day (QID) | ORAL | Status: DC | PRN

## 2014-05-27 MED ORDER — SODIUM CHLORIDE 0.9 % IR SOLN
Status: DC | PRN
  Administered 2014-05-27 (×9)

## 2014-05-27 MED ORDER — OXYCODONE HCL 5 MG PO TABS
5.0000 mg | ORAL_TABLET | Freq: Once | ORAL | Status: AC
  Administered 2014-05-27: 5 mg via ORAL
  Filled 2014-05-27: qty 1

## 2014-05-27 MED ORDER — DEXAMETHASONE SODIUM PHOSPHATE 4 MG/ML IJ SOLN
4.0000 mg | Freq: Once | INTRAMUSCULAR | Status: AC
  Administered 2014-05-27: 4 mg via INTRAVENOUS

## 2014-05-27 MED ORDER — SODIUM CHLORIDE 0.9 % IR SOLN
Status: DC | PRN
  Administered 2014-05-27: 1000 mL

## 2014-05-27 MED ORDER — MIDAZOLAM HCL 2 MG/2ML IJ SOLN
INTRAMUSCULAR | Status: AC
  Filled 2014-05-27: qty 2

## 2014-05-27 MED ORDER — PREGABALIN 50 MG PO CAPS
ORAL_CAPSULE | ORAL | Status: AC
  Filled 2014-05-27: qty 1

## 2014-05-27 MED ORDER — CELECOXIB 100 MG PO CAPS: 200.0000 mg | ORAL_CAPSULE | Freq: Every day | ORAL | Status: DC

## 2014-05-27 MED ORDER — FENTANYL CITRATE 0.05 MG/ML IJ SOLN
25.0000 ug | INTRAMUSCULAR | Status: DC | PRN
  Administered 2014-05-27 (×4): 50 ug via INTRAVENOUS

## 2014-05-27 MED ORDER — OXYCODONE HCL 5 MG PO TABS: 5.0000 mg | ORAL_TABLET | ORAL | Status: DC

## 2014-05-27 MED ORDER — DEXTROSE 5 % IV SOLN
500.0000 mg | Freq: Once | INTRAVENOUS | Status: AC
  Administered 2014-05-27: 500 mg via INTRAVENOUS
  Filled 2014-05-27: qty 5

## 2014-05-27 MED ORDER — CELECOXIB 400 MG PO CAPS
400.0000 mg | ORAL_CAPSULE | Freq: Once | ORAL | Status: AC
  Administered 2014-05-27: 400 mg via ORAL
  Filled 2014-05-27: qty 1

## 2014-05-27 MED ORDER — EPINEPHRINE HCL 1 MG/ML IJ SOLN
INTRAMUSCULAR | Status: AC
  Filled 2014-05-27: qty 5

## 2014-05-27 MED ORDER — DEXAMETHASONE SODIUM PHOSPHATE 4 MG/ML IJ SOLN
INTRAMUSCULAR | Status: AC
  Filled 2014-05-27: qty 1

## 2014-05-27 MED ORDER — ONDANSETRON HCL 4 MG/2ML IJ SOLN
INTRAMUSCULAR | Status: AC
  Filled 2014-05-27: qty 2

## 2014-05-27 MED ORDER — PREGABALIN 50 MG PO CAPS
50.0000 mg | ORAL_CAPSULE | Freq: Three times a day (TID) | ORAL | Status: DC
  Administered 2014-05-27: 50 mg via ORAL

## 2014-05-27 MED ORDER — GLYCOPYRROLATE 0.2 MG/ML IJ SOLN
INTRAMUSCULAR | Status: DC | PRN
  Administered 2014-05-27: .3 mg via INTRAVENOUS

## 2014-05-27 MED ORDER — FENTANYL CITRATE 0.05 MG/ML IJ SOLN
INTRAMUSCULAR | Status: AC
  Filled 2014-05-27: qty 5

## 2014-05-27 MED ORDER — ONDANSETRON HCL 4 MG/2ML IJ SOLN: 4.0000 mg | Freq: Four times a day (QID) | INTRAMUSCULAR | Status: DC

## 2014-05-27 MED ORDER — DEXTROSE 5 % IV SOLN: 500.0000 mg | Freq: Four times a day (QID) | INTRAVENOUS | Status: DC

## 2014-05-27 MED ORDER — METHOCARBAMOL 500 MG PO TABS: 500.0000 mg | ORAL_TABLET | Freq: Four times a day (QID) | ORAL | Status: DC

## 2014-05-27 SURGICAL SUPPLY — 103 items
ANCHOR SUT 5.5 SPEEDSCREW (Screw) ×3 IMPLANT
BAG HAMPER (MISCELLANEOUS) ×3 IMPLANT
BENZOIN TINCTURE PRP APPL 2/3 (GAUZE/BANDAGES/DRESSINGS) ×3 IMPLANT
BIT DRILL 2.0MX128MM (BIT) ×3 IMPLANT
BLADE 10 SAFETY STRL DISP (BLADE) ×3 IMPLANT
BLADE 11 SAFETY STRL DISP (BLADE) ×3 IMPLANT
BLADE AGGRESSIVE PLUS 4.0 (BLADE) ×3 IMPLANT
BLADE AVERAGE 25MMX9MM (BLADE)
BLADE AVERAGE 25X9 (BLADE) IMPLANT
BLADE HEX COATED 2.75 (ELECTRODE) ×3 IMPLANT
BNDG COHESIVE 4X5 TAN STRL (GAUZE/BANDAGES/DRESSINGS) ×3 IMPLANT
BUR 5.0 BARRELL (BURR) IMPLANT
BUR BARRELL 4.0 (BURR) IMPLANT
BUR ROUND 5.0 (BURR) IMPLANT
CANNULA DRILOCK 5.0MMX75MM (CANNULA) ×1
CANNULA DRILOCK 5.0X75 (CANNULA) ×2 IMPLANT
CANNULA DRILOCK 6.5MMX75MM (CANNULA) ×1
CANNULA DRILOCK 6.5X75 (CANNULA) ×2 IMPLANT
CANNULA DRILOCK 8.0MMX75MM (CANNULA)
CANNULA DRILOCK 8.0X75 (CANNULA) IMPLANT
CHLORAPREP W/TINT 26ML (MISCELLANEOUS) ×3 IMPLANT
CLOSURE WOUND 1/2 X4 (GAUZE/BANDAGES/DRESSINGS) ×1
CLOTH BEACON ORANGE TIMEOUT ST (SAFETY) ×3 IMPLANT
COVER LIGHT HANDLE STERIS (MISCELLANEOUS) ×6 IMPLANT
COVER PROBE W GEL 5X96 (DRAPES) ×3 IMPLANT
DECANTER SPIKE VIAL GLASS SM (MISCELLANEOUS) ×3 IMPLANT
DRAPE PROXIMA HALF (DRAPES) ×3 IMPLANT
DRAPE SHOULDER BEACH CHAIR (DRAPES) ×3 IMPLANT
DRAPE U-SHAPE 47X51 STRL (DRAPES) ×3 IMPLANT
DRESSING ALLEVYN BORDER HEEL (GAUZE/BANDAGES/DRESSINGS) ×3 IMPLANT
DRSG MEPILEX BORDER 4X8 (GAUZE/BANDAGES/DRESSINGS) IMPLANT
DRSG TEGADERM 2-3/8X2-3/4 SM (GAUZE/BANDAGES/DRESSINGS) ×3 IMPLANT
ELECT REM PT RETURN 9FT ADLT (ELECTROSURGICAL) ×3
ELECTRODE REM PT RTRN 9FT ADLT (ELECTROSURGICAL) ×1 IMPLANT
FIBERSTICK 2 (SUTURE) IMPLANT
GAUZE SPONGE 4X4 16PLY XRAY LF (GAUZE/BANDAGES/DRESSINGS) ×3 IMPLANT
GLOVE BIO SURGEON STRL SZ 6.5 (GLOVE) ×2 IMPLANT
GLOVE BIO SURGEONS STRL SZ 6.5 (GLOVE) ×1
GLOVE ECLIPSE 6.5 STRL STRAW (GLOVE) ×3 IMPLANT
GLOVE ECLIPSE 8.5 STRL (GLOVE) ×3 IMPLANT
GLOVE INDICATOR 7.0 STRL GRN (GLOVE) ×6 IMPLANT
GLOVE INDICATOR 8.5 STRL (GLOVE) ×3 IMPLANT
GLOVE SKINSENSE NS SZ8.0 LF (GLOVE) ×2
GLOVE SKINSENSE STRL SZ8.0 LF (GLOVE) ×1 IMPLANT
GLOVE SS N UNI LF 8.5 STRL (GLOVE) ×3 IMPLANT
GOWN STRL REUS W/TWL LRG LVL3 (GOWN DISPOSABLE) ×9 IMPLANT
GOWN STRL REUS W/TWL XL LVL3 (GOWN DISPOSABLE) ×3 IMPLANT
IMMOBILIZER SHOULDER LGE (ORTHOPEDIC SUPPLIES) ×3 IMPLANT
IMMOBILIZER SHOULDER MED (ORTHOPEDIC SUPPLIES) IMPLANT
INST SET MINOR BONE (KITS) ×3 IMPLANT
IV NS IRRIG 3000ML ARTHROMATIC (IV SOLUTION) ×27 IMPLANT
KIT BLADEGUARD II DBL (SET/KITS/TRAYS/PACK) ×3 IMPLANT
KIT POSITION SHOULDER SCHLEI (MISCELLANEOUS) ×3 IMPLANT
KIT ROOM TURNOVER APOR (KITS) ×3 IMPLANT
MANIFOLD NEPTUNE II (INSTRUMENTS) ×3 IMPLANT
MARKER SKIN DUAL TIP RULER LAB (MISCELLANEOUS) ×3 IMPLANT
NEEDLE HYPO 18GX1.5 BLUNT FILL (NEEDLE) ×3 IMPLANT
NEEDLE HYPO 21X1.5 SAFETY (NEEDLE) ×3 IMPLANT
NEEDLE MAYO 6 CRC TAPER PT (NEEDLE) ×3 IMPLANT
NEEDLE SCORPION (NEEDLE) IMPLANT
NEEDLE SPNL 18GX3.5 QUINCKE PK (NEEDLE) ×3 IMPLANT
NS IRRIG 1000ML POUR BTL (IV SOLUTION) ×3 IMPLANT
PACK BASIC III (CUSTOM PROCEDURE TRAY) ×2
PACK SRG BSC III STRL LF ECLPS (CUSTOM PROCEDURE TRAY) ×1 IMPLANT
PAD ABD 5X9 TENDERSORB (GAUZE/BANDAGES/DRESSINGS) IMPLANT
PAD ARMBOARD 7.5X6 YLW CONV (MISCELLANEOUS) ×3 IMPLANT
PASSER SUT CAPTURE FIRST (SUTURE) IMPLANT
PENCIL HANDSWITCHING (ELECTRODE) ×3 IMPLANT
PERFECT PASSER CONNECTOR ×3 IMPLANT
RASP SM TEAR CROSS CUT (RASP) IMPLANT
SET ARTHROSCOPY INST (INSTRUMENTS) ×3 IMPLANT
SET BASIN LINEN APH (SET/KITS/TRAYS/PACK) ×3 IMPLANT
SPONGE GAUZE 2X2 8PLY STER LF (GAUZE/BANDAGES/DRESSINGS) ×1
SPONGE GAUZE 2X2 8PLY STRL LF (GAUZE/BANDAGES/DRESSINGS) ×2 IMPLANT
STOCKINETTE IMPERVIOUS LG (DRAPES) ×3 IMPLANT
STRIP CLOSURE SKIN 1/2X4 (GAUZE/BANDAGES/DRESSINGS) ×2 IMPLANT
SUT BONE WAX W31G (SUTURE) IMPLANT
SUT ETHIBOND NAB OS 4 #2 30IN (SUTURE) ×3 IMPLANT
SUT ETHILON 3 0 FSL (SUTURE) ×3 IMPLANT
SUT FIBERWIRE #2 38 REV NDL BL (SUTURE)
SUT FIBERWIRE #2 38 T-5 BLUE (SUTURE)
SUT LASSO 45 DEGREE (SUTURE) IMPLANT
SUT LASSO 45 DEGREE LEFT (SUTURE) IMPLANT
SUT LASSO 45D RIGHT (SUTURE) IMPLANT
SUT MNCRL 0 VIOLET CTX 36 (SUTURE) ×1 IMPLANT
SUT MON AB 0 CT1 (SUTURE) ×3 IMPLANT
SUT MON AB 2-0 CT1 36 (SUTURE) ×3 IMPLANT
SUT MONOCRYL 0 CTX 36 (SUTURE) ×2
SUT PDS AB CT VIOLET #0 27IN (SUTURE) ×6 IMPLANT
SUT PROLENE 2 0 FS (SUTURE) IMPLANT
SUT SMART STITCH CARTRIDGE (SUTURE) ×3 IMPLANT
SUT VIC AB 1 CT1 27 (SUTURE)
SUT VIC AB 1 CT1 27XBRD ANTBC (SUTURE) IMPLANT
SUTURE FIBERWR #2 38 T-5 BLUE (SUTURE) IMPLANT
SUTURE FIBERWR#2 38 REV NDL BL (SUTURE) IMPLANT
SYR 30ML LL (SYRINGE) ×3 IMPLANT
SYR 5ML LL (SYRINGE) ×3 IMPLANT
SYR BULB IRRIGATION 50ML (SYRINGE) IMPLANT
TOWEL OR 17X26 4PK STRL BLUE (TOWEL DISPOSABLE) ×3 IMPLANT
TUBING ARTHROSCOPY INFLOW/OUT (IRRIGATION / IRRIGATOR) ×3 IMPLANT
WAND 90 DEG TURBOVAC W/CORD (SURGICAL WAND) IMPLANT
YANKAUER SUCT 12FT TUBE ARGYLE (SUCTIONS) ×3 IMPLANT
YANKAUER SUCT BULB TIP 10FT TU (MISCELLANEOUS) ×9 IMPLANT

## 2014-05-27 NOTE — Discharge Instructions (Signed)
Incision Care °An incision is when a surgeon cuts into your body tissues. After surgery, the incision needs to be cared for properly to prevent infection.  °HOME CARE INSTRUCTIONS  °· Take all medicine as directed by your caregiver. Only take over-the-counter or prescription medicines for pain, discomfort, or fever as directed by your caregiver. °· Do not remove your bandage (dressing) or get your incision wet until your surgeon gives you permission. In the event that your dressing becomes wet, dirty, or starts to smell, change the dressing and call your surgeon for instructions as soon as possible. °· Take showers. Do not take tub baths, swim, or do anything that may soak the wound until it is healed. °· Resume your normal diet and activities as directed or allowed. °· Avoid lifting any weight until you are instructed otherwise. °· Use anti-itch antihistamine medicine as directed by your caregiver. The wound may itch when it is healing. Do not pick or scratch at the wound. °· Follow up with your caregiver for stitch (suture) or staple removal as directed. °· Drink enough fluids to keep your urine clear or pale yellow. °SEEK MEDICAL CARE IF:  °· You have redness, swelling, or increasing pain in the wound that is not controlled with medicine. °· You have drainage, blood, or pus coming from the wound that lasts longer than 1 day. °· You develop muscle aches, chills, or a general ill feeling. °· You notice a bad smell coming from the wound or dressing. °· Your wound edges separate after the sutures, staples, or skin adhesive strips have been removed. °· You develop persistent nausea or vomiting. °SEEK IMMEDIATE MEDICAL CARE IF:  °· You have a fever. °· You develop a rash. °· You develop dizzy episodes or faint while standing. °· You have difficulty breathing. °· You develop any reaction or side effects to medicine given. °MAKE SURE YOU:  °· Understand these instructions. °· Will watch your condition. °· Will get help  right away if you are not doing well or get worse. ° °

## 2014-05-27 NOTE — Brief Op Note (Addendum)
05/27/2014  9:46 AM  PATIENT:  Alexandria Mejia  43 y.o. female  PRE-OPERATIVE DIAGNOSIS:  Right Rotator Cuff Tear  POST-OPERATIVE DIAGNOSIS:  Right Rotator Cuff Tear  PROCEDURE:  Procedure(s): SHOULDER ARTHROSCOPY WITH ROTATOR CUFF REPAIR (Right) OPEN SUBSCAPULARIS REPAIR (Right)   Findings Partial rotator cuff tear articular surface                Subscapularis tear partial superior surface   Details of procedure First identified the patient in the preop area confirm the right shoulder as a surgical site marked as such. The patient was then taken to the operating room for general anesthesia. After successful and uncomplicated general anesthesia she was placed in the modified beachchair position for arthroscopy. After sterile prep and drape the timeout was completed.  A diagnostic arthroscopy was performed through standard posterior portal. Good visualization was obtained. Synovitis was noted throughout the joint. There was a partial tear the superior edge of the subscapularis an undersurface tear of the anterior portion of the supraspinatus tendon.  The biceps tendon had mild inflammation the glenoid was intact as was the humeral head an axillary pouch was free of any loose bodies.  An anterior portal was established and the subscapularis tear was debrided as was the supraspinatus tendon tear. Through a separate needle portal a PDS suture was placed in the leading edge of the subscapularis tendon at the superior aspect and this tear was repaired. The supraspinatus tendon fibers left were less than 50%. A second PDS suture was placed to [facility] the supraspinatus tendon tear  The scope was then removed placed in the subacromial space and a mild bursectomy was performed  An anterior incision was made in the PDS suture was used to find the rotator cuff tear from the superior aspect. This tear was completed. After debridement of the greater tuberosity and drilling of 3 holes into the  tuberosity the ArthroCare suture passer was used to pass one suture, followed by insertion of an anchor and repair of the tendon. #2 Ethibond suture was also used to reinforce the repair. Excellent watertight closure was obtained.  The wounds were irrigated and closed with 0 Monocryl and 3-0 nylon. We injected Marcaine and epinephrine subacromial space and around the incision. The portals were closed with 3-0 nylon suture.  The shoulder immobilizer was applied. The patient is expecting to room in stable condition  Postop plan: Immediate passive range of motion can be started next week. Active range of motion delayed for 6 weeks. Sling for 4 weeks.    SURGEON:  Surgeon(s) and Role:    * Vickki HearingStanley E Taiz Bickle, MD - Primary  PHYSICIAN ASSISTANT:   ASSISTANTS: betty ashley   ANESTHESIA:   general  EBL:  Total I/O In: 800 [I.V.:800] Out: 50 [Blood:50]  BLOOD ADMINISTERED:none  DRAINS: none   LOCAL MEDICATIONS USED:  MARCAINE     SPECIMEN:  No Specimen  DISPOSITION OF SPECIMEN:  N/A  COUNTS:  YES  TOURNIQUET:  * No tourniquets in log *  DICTATION: .Dragon Dictation  PLAN OF CARE: Discharge to home after PACU  PATIENT DISPOSITION:  PACU - hemodynamically stable.   Delay start of Pharmacological VTE agent (>24hrs) due to surgical blood loss or risk of bleeding: not applicable

## 2014-05-27 NOTE — Interval H&P Note (Signed)
History and Physical Interval Note:  05/27/2014 7:18 AM  Alexandria PeltMichelle M Mejia  has presented today for surgery, with the diagnosis of Right Rotator Cuff Tear  The various methods of treatment have been discussed with the patient and family. After consideration of risks, benefits and other options for treatment, the patient has consented to  Procedure(s): SHOULDER ARTHROSCOPY WITH ROTATOR CUFF REPAIR (Right) as a surgical intervention .  The patient's history has been reviewed, patient examined, no change in status, stable for surgery.  I have reviewed the patient's chart and labs.  Questions were answered to the patient's satisfaction.     Fuller CanadaStanley Harrison

## 2014-05-27 NOTE — Anesthesia Postprocedure Evaluation (Signed)
  Anesthesia Post-op Note  Patient: Alexandria Mejia  Procedure(s) Performed: Procedure(s): SHOULDER ARTHROSCOPY WITH ROTATOR CUFF REPAIR, subscalpularis repair, open supraspinatus repair (Right)  Patient Location: PACU  Anesthesia Type:General  Level of Consciousness: awake, alert  and oriented  Airway and Oxygen Therapy: Patient Spontanous Breathing and Patient connected to face mask oxygen  Post-op Pain: moderate  Post-op Assessment: Post-op Vital signs reviewed, Patient's Cardiovascular Status Stable, Respiratory Function Stable, Patent Airway and No signs of Nausea or vomiting  Post-op Vital Signs: Reviewed and stable  Last Vitals:  Filed Vitals:   05/27/14 0715  BP: 143/74  Pulse:   Temp:   Resp: 23    Complications: No apparent anesthesia complications

## 2014-05-27 NOTE — Anesthesia Preprocedure Evaluation (Signed)
Anesthesia Evaluation  Patient identified by MRN, date of birth, ID band Patient awake    Reviewed: Allergy & Precautions, H&P , NPO status , Patient's Chart, lab work & pertinent test results  History of Anesthesia Complications (+) PROLONGED EMERGENCE and history of anesthetic complications ("slow to wake up")  Airway Mallampati: I TM Distance: >3 FB     Dental  (+) Teeth Intact   Pulmonary Current Smoker,  breath sounds clear to auscultation        Cardiovascular negative cardio ROS  Rhythm:Regular Rate:Normal     Neuro/Psych  Headaches,    GI/Hepatic GERD-  Controlled and Medicated,  Endo/Other    Renal/GU      Musculoskeletal   Abdominal   Peds  Hematology   Anesthesia Other Findings   Reproductive/Obstetrics                           Anesthesia Physical Anesthesia Plan  ASA: II  Anesthesia Plan: General   Post-op Pain Management:    Induction: Intravenous, Rapid sequence and Cricoid pressure planned  Airway Management Planned: Oral ETT  Additional Equipment:   Intra-op Plan:   Post-operative Plan: Extubation in OR  Informed Consent: I have reviewed the patients History and Physical, chart, labs and discussed the procedure including the risks, benefits and alternatives for the proposed anesthesia with the patient or authorized representative who has indicated his/her understanding and acceptance.     Plan Discussed with:   Anesthesia Plan Comments:         Anesthesia Quick Evaluation

## 2014-05-27 NOTE — Anesthesia Procedure Notes (Signed)
Procedure Name: Intubation Date/Time: 05/27/2014 7:41 AM Performed by: Glynn OctaveANIEL, Maebell Lyvers E Pre-anesthesia Checklist: Patient identified, Patient being monitored, Timeout performed, Emergency Drugs available and Suction available Patient Re-evaluated:Patient Re-evaluated prior to inductionOxygen Delivery Method: Circle System Utilized Preoxygenation: Pre-oxygenation with 100% oxygen Intubation Type: IV induction, Rapid sequence and Cricoid Pressure applied Ventilation: Mask ventilation without difficulty Laryngoscope Size: Mac and 3 Grade View: Grade I Tube type: Oral Tube size: 7.0 mm Number of attempts: 1 Airway Equipment and Method: stylet Placement Confirmation: ETT inserted through vocal cords under direct vision,  positive ETCO2 and breath sounds checked- equal and bilateral Secured at: 22 cm Tube secured with: Tape Dental Injury: Teeth and Oropharynx as per pre-operative assessment

## 2014-05-27 NOTE — Transfer of Care (Signed)
Immediate Anesthesia Transfer of Care Note  Patient: Alexandria PeltMichelle M Scalzo  Procedure(s) Performed: Procedure(s): SHOULDER ARTHROSCOPY WITH ROTATOR CUFF REPAIR, subscalpularis repair, open supraspinatus repair (Right)  Patient Location: PACU  Anesthesia Type:General  Level of Consciousness: awake, alert  and oriented  Airway & Oxygen Therapy: Patient Spontanous Breathing  Post-op Assessment: Report given to PACU RN  Post vital signs: Reviewed  Complications: No apparent anesthesia complications

## 2014-05-29 NOTE — Op Note (Signed)
Author: Vickki HearingStanley E Dresden Lozito, MD Service: Orthopedics Author Type: Physician     Filed: 05/27/2014  2:02 PM Note Time: 05/27/2014  9:46 AM Status: Addendum    Editor: Vickki HearingStanley E Namine Beahm, MD (Physician)        Related Notes: Original Note by Vickki HearingStanley E Elic Vencill, MD (Physician) filed at 05/27/2014  9:48 AM    05/27/2014  9:46 AM  PATIENT:  Alexandria Mejia  43 y.o. female  PRE-OPERATIVE DIAGNOSIS:  Right Rotator Cuff Tear  POST-OPERATIVE DIAGNOSIS:  Right Rotator Cuff Tear  PROCEDURE:  Procedure(s): SHOULDER ARTHROSCOPY WITH ROTATOR CUFF REPAIR (Right) OPEN SUBSCAPULARIS REPAIR (Right)   Findings Partial rotator cuff tear articular surface                Subscapularis tear partial superior surface   Details of procedure First identified the patient in the preop area confirm the right shoulder as a surgical site marked as such. The patient was then taken to the operating room for general anesthesia. After successful and uncomplicated general anesthesia she was placed in the modified beachchair position for arthroscopy. After sterile prep and drape the timeout was completed.  A diagnostic arthroscopy was performed through standard posterior portal. Good visualization was obtained. Synovitis was noted throughout the joint. There was a partial tear the superior edge of the subscapularis an undersurface tear of the anterior portion of the supraspinatus tendon.  The biceps tendon had mild inflammation the glenoid was intact as was the humeral head an axillary pouch was free of any loose bodies.  An anterior portal was established and the subscapularis tear was debrided as was the supraspinatus tendon tear. Through a separate needle portal a PDS suture was placed in the leading edge of the subscapularis tendon at the superior aspect and this tear was repaired. The supraspinatus tendon fibers left were less than 50%. A second PDS suture was placed to [facility] the supraspinatus tendon tear  The scope was  then removed placed in the subacromial space and a mild bursectomy was performed  An anterior incision was made in the PDS suture was used to find the rotator cuff tear from the superior aspect. This tear was completed. After debridement of the greater tuberosity and drilling of 3 holes into the tuberosity the ArthroCare suture passer was used to pass one suture, followed by insertion of an anchor and repair of the tendon. #2 Ethibond suture was also used to reinforce the repair. Excellent watertight closure was obtained.  The wounds were irrigated and closed with 0 Monocryl and 3-0 nylon. We injected Marcaine and epinephrine subacromial space and around the incision. The portals were closed with 3-0 nylon suture.  The shoulder immobilizer was applied. The patient is expecting to room in stable condition  Postop plan: Immediate passive range of motion can be started next week. Active range of motion delayed for 6 weeks. Sling for 4 weeks.    SURGEON:  Surgeon(s) and Role:    * Vickki HearingStanley E Haruna Rohlfs, MD - Primary  PHYSICIAN ASSISTANT:   ASSISTANTS: betty ashley   ANESTHESIA:   general  EBL:  Total I/O In: 800 [I.V.:800] Out: 50 [Blood:50]  BLOOD ADMINISTERED:none  DRAINS: none   LOCAL MEDICATIONS USED:  MARCAINE     SPECIMEN:  No Specimen  DISPOSITION OF SPECIMEN:  N/A  COUNTS:  YES  TOURNIQUET:  * No tourniquets in log *  DICTATION: .Dragon Dictation  PLAN OF CARE: Discharge to home after PACU  PATIENT DISPOSITION:  PACU - hemodynamically stable.  Delay start of Pharmacological VTE agent (>24hrs) due to surgical blood loss or risk of bleeding: not applicable

## 2014-05-30 ENCOUNTER — Ambulatory Visit (INDEPENDENT_AMBULATORY_CARE_PROVIDER_SITE_OTHER): Payer: Worker's Compensation | Admitting: Orthopedic Surgery

## 2014-05-30 ENCOUNTER — Ambulatory Visit: Payer: BC Managed Care – PPO | Admitting: Orthopedic Surgery

## 2014-05-30 VITALS — BP 120/79 | Ht 69.0 in | Wt 241.0 lb

## 2014-05-30 DIAGNOSIS — M75110 Incomplete rotator cuff tear or rupture of unspecified shoulder, not specified as traumatic: Secondary | ICD-10-CM

## 2014-05-30 DIAGNOSIS — S46909A Unspecified injury of unspecified muscle, fascia and tendon at shoulder and upper arm level, unspecified arm, initial encounter: Secondary | ICD-10-CM

## 2014-05-30 DIAGNOSIS — S4980XA Other specified injuries of shoulder and upper arm, unspecified arm, initial encounter: Secondary | ICD-10-CM

## 2014-05-30 DIAGNOSIS — S4990XA Unspecified injury of shoulder and upper arm, unspecified arm, initial encounter: Secondary | ICD-10-CM

## 2014-05-30 DIAGNOSIS — S43429A Sprain of unspecified rotator cuff capsule, initial encounter: Secondary | ICD-10-CM

## 2014-05-30 DIAGNOSIS — M75101 Unspecified rotator cuff tear or rupture of right shoulder, not specified as traumatic: Secondary | ICD-10-CM

## 2014-05-30 MED ORDER — OXYCODONE-ACETAMINOPHEN 5-325 MG PO TABS: 1.0000 | ORAL_TABLET | ORAL | Status: DC | PRN

## 2014-05-30 NOTE — Patient Instructions (Signed)
Start PT 

## 2014-05-30 NOTE — Progress Notes (Signed)
Patient ID: Alexandria PeltMichelle M Mejia, female   DOB: 13-May-1971, 43 y.o.   MRN: [id number] Chief Complaint  Patient presents with  . Follow-up    Post op # 1 Right Shoulder RCR DOS 05/27/14    Postop visit #1 status post rotator cuff repair right shoulder she had an arthroscopic subscapularis repair and she had an open rotator cuff repair supraspinatus tendon  She will be placed in a sling shot  Start therapy  Increased pain medication to address pain control  Sutures were taken of the portals the long running suture will be taken out by the physician on the 29th  Physical therapy instruction start passive range of motion as tolerated without restriction

## 2014-05-31 ENCOUNTER — Encounter: Payer: Self-pay | Admitting: Orthopedic Surgery

## 2014-06-01 ENCOUNTER — Telehealth: Payer: Self-pay | Admitting: Orthopedic Surgery

## 2014-06-01 NOTE — Telephone Encounter (Signed)
Routing to Dr Harrison 

## 2014-06-01 NOTE — Telephone Encounter (Signed)
Spoke with and faxed notes, and request for approval for physical therapy through Workers Comp nurse case manager, Ian MalkinSherri McCormick, for [facility]; sent to fax# [phone],  Ph# [phone]. Awaiting approval.  Patient aware of status.

## 2014-06-01 NOTE — Telephone Encounter (Signed)
Spoke with the patient and she does not want anything stronger. She wants to know if she can go back to taking the Hydrocodone 10/325 mg, because the percocet is too strong and causing her to have bad headaches. If you agree she will need a refill on the hydrocodone.

## 2014-06-01 NOTE — Telephone Encounter (Signed)
Patient called and requested a stronger pain med. Patient states she had surgery on Friday and that all the med is doing is giving her headaches. She believes the name of the med is Percocet. Patient asked to be called after 5 if possible and patient phone is [phone]

## 2014-06-02 ENCOUNTER — Other Ambulatory Visit: Payer: Self-pay | Admitting: Orthopedic Surgery

## 2014-06-02 MED ORDER — HYDROCODONE-ACETAMINOPHEN 10-325 MG PO TABS: 1.0000 | ORAL_TABLET | Freq: Four times a day (QID) | ORAL | Status: DC | PRN

## 2014-06-02 NOTE — Telephone Encounter (Signed)
Dr. Romeo AppleHarrison refilled and Matthias HughsKecia was notified of times that [name] could pick up her prescription.

## 2014-06-02 NOTE — Telephone Encounter (Signed)
Approved for physical therapy at [facility]. Orders faxed to [phone]. Patient in process of scheduling -- appointment to be made for next week.  Follow up here as scheduled.

## 2014-06-02 NOTE — Telephone Encounter (Signed)
Received call back from 3rd party [employer] service for Workers Comp - per Tresa EndoKelly, approval for therapy is in processing;  appointment to follow - with same provider, [facility] in [place].  Awaiting further notification; patient is also being contacted by the Workers Comp representative.

## 2014-06-04 ENCOUNTER — Emergency Department (HOSPITAL_COMMUNITY)
Admission: EM | Admit: 2014-06-04 | Discharge: 2014-06-04 | Disposition: A | Discharge status: Home / Self Care | Payer: Worker's Compensation | Attending: Emergency Medicine | Admitting: Emergency Medicine

## 2014-06-04 ENCOUNTER — Emergency Department (HOSPITAL_COMMUNITY): Payer: Worker's Compensation

## 2014-06-04 ENCOUNTER — Encounter (HOSPITAL_COMMUNITY): Payer: Self-pay | Admitting: Emergency Medicine

## 2014-06-04 DIAGNOSIS — F17200 Nicotine dependence, unspecified, uncomplicated: Secondary | ICD-10-CM | POA: Insufficient documentation

## 2014-06-04 DIAGNOSIS — M25519 Pain in unspecified shoulder: Secondary | ICD-10-CM | POA: Insufficient documentation

## 2014-06-04 DIAGNOSIS — Z8679 Personal history of other diseases of the circulatory system: Secondary | ICD-10-CM | POA: Insufficient documentation

## 2014-06-04 DIAGNOSIS — Z79899 Other long term (current) drug therapy: Secondary | ICD-10-CM | POA: Insufficient documentation

## 2014-06-04 DIAGNOSIS — Z8719 Personal history of other diseases of the digestive system: Secondary | ICD-10-CM | POA: Insufficient documentation

## 2014-06-04 DIAGNOSIS — I499 Cardiac arrhythmia, unspecified: Secondary | ICD-10-CM | POA: Insufficient documentation

## 2014-06-04 DIAGNOSIS — M7989 Other specified soft tissue disorders: Secondary | ICD-10-CM | POA: Insufficient documentation

## 2014-06-04 DIAGNOSIS — Z9889 Other specified postprocedural states: Secondary | ICD-10-CM | POA: Insufficient documentation

## 2014-06-04 DIAGNOSIS — M79601 Pain in right arm: Secondary | ICD-10-CM

## 2014-06-04 LAB — BASIC METABOLIC PANEL
BUN: 10 mg/dL (ref 6–23)
CALCIUM: 9.5 mg/dL (ref 8.4–10.5)
CO2: 25 mEq/L (ref 19–32)
CREATININE: 0.63 mg/dL (ref 0.50–1.10)
Chloride: 100 mEq/L (ref 96–112)
GFR calc Af Amer: >90 mL/min (ref >90)
GLUCOSE: 178 mg/dL — AB (ref 70–99)
Potassium: 4.4 mEq/L (ref 3.7–5.3)
SODIUM: 138 meq/L (ref 137–147)

## 2014-06-04 LAB — CBC WITH DIFFERENTIAL/PLATELET
Basophils Absolute: 0 10*3/uL (ref 0.0–0.1)
Basophils Relative: 0 % (ref 0–1)
EOS ABS: 0.1 10*3/uL (ref 0.0–0.7)
Eosinophils Relative: 1 % (ref 0–5)
HCT: 44.4 % (ref 36.0–46.0)
Hemoglobin: 15.2 g/dL — ABNORMAL HIGH (ref 12.0–15.0)
LYMPHS ABS: 2.3 10*3/uL (ref 0.7–4.0)
Lymphocytes Relative: 27 % (ref 12–46)
MCH: 31.8 pg (ref 26.0–34.0)
MCHC: 34.2 g/dL (ref 30.0–36.0)
MCV: 92.9 fL (ref 78.0–100.0)
MONO ABS: 0.6 10*3/uL (ref 0.1–1.0)
Monocytes Relative: 7 % (ref 3–12)
Neutro Abs: 5.5 10*3/uL (ref 1.7–7.7)
Neutrophils Relative %: 65 % (ref 43–77)
PLATELETS: 260 10*3/uL (ref 150–400)
RBC: 4.78 MIL/uL (ref 3.87–5.11)
RDW: 12.9 % (ref 11.5–15.5)
WBC: 8.5 10*3/uL (ref 4.0–10.5)

## 2014-06-04 MED ORDER — CYCLOBENZAPRINE HCL 10 MG PO TABS
10.0000 mg | ORAL_TABLET | Freq: Once | ORAL | Status: AC
  Administered 2014-06-04: 10 mg via ORAL
  Filled 2014-06-04: qty 1

## 2014-06-04 MED ORDER — OXYCODONE-ACETAMINOPHEN 5-325 MG PO TABS
2.0000 | ORAL_TABLET | Freq: Once | ORAL | Status: AC
  Administered 2014-06-04: 2 via ORAL
  Filled 2014-06-04: qty 2

## 2014-06-04 NOTE — ED Notes (Signed)
Pt states had right rotator cuff surgery on 05/27/14 states had small bruise on right bicep come up after surgery but now has gotten bigger. Pt states right bicep burns where bruise is . Dry dressing to right shoulder. Pt has sling to where for right arm.

## 2014-06-04 NOTE — Discharge Instructions (Signed)
Glucose was slightly elevated at 178. Ultrasound showed no blood clot. X-ray was normal.  Keep sling on. Take your pain medication. Return if worse or followup your orthopedic surgeon

## 2014-06-04 NOTE — ED Notes (Signed)
MD at bedside. 

## 2014-06-04 NOTE — ED Notes (Signed)
Right Rotator cuff surgery recently, new bruise on upper arm growing in size

## 2014-06-04 NOTE — ED Provider Notes (Signed)
CSN: [id number]     Arrival date & time 06/04/14  1234 History   First MD Initiated Contact with Patient 06/04/14 1244     Chief Complaint  Patient presents with  . Arm Injury     (Consider location/radiation/quality/duration/timing/severity/associated sxs/prior Treatment) HPI.... status post rotator cuff repair 8 days ago by Dr. Romeo AppleHarrison. She was seen past Monday also for a wound check. She is complaining of pain in her right shoulder and swelling and discoloration in her distal biceps.  No fever or chills. No pus from wound. Severity is moderate. Position palpation makes pain worse. No radiation of pain  Past Medical History  Diagnosis Date  . Back pain   . GERD (gastroesophageal reflux disease)   . Migraine   . Complication of anesthesia     patient states "with last surgery I was hard to wake up".  . Dysrhythmia     pt states "I have an irregular heart rate and it skips a beat".   Past Surgical History  Procedure Laterality Date  . Cholecystectomy    . Appendectomy    . Neck surgery      decompression of 7 disc  . Back surgery      lumbar-herniated disc  . Shoulder arthroscopy with rotator cuff repair Right 05/27/2014    Procedure: SHOULDER ARTHROSCOPY WITH ROTATOR CUFF REPAIR, subscalpularis repair, open supraspinatus repair;  Surgeon: Vickki HearingStanley E Harrison, MD;  Location: [facility];  Service: Orthopedics;  Laterality: Right;   Family History  Problem Relation Age of Onset  . COPD Mother   . Arthritis Mother   . Diabetes Father   . Heart disease Father   . Hypertension Brother   . Diabetes Maternal Grandmother   . Diabetes Paternal Grandmother   . Dementia Paternal Grandfather    History  Substance Use Topics  . Smoking status: Current Some Day Smoker -- 0.25 packs/day for 30 years    Types: Cigarettes  . Smokeless tobacco: Never Used  . Alcohol Use: No   OB History   Grav Para Term Preterm Abortions TAB SAB Ect Mult Living                 Review of Systems  All  other systems reviewed and are negative.     Allergies  Codeine and Sulfa antibiotics  Home Medications   Prior to Admission medications   Medication Sig Start Date End Date Taking? Authorizing Provider  HYDROcodone-acetaminophen (NORCO) 10-325 MG per tablet Take 1 tablet by mouth every 6 (six) hours as needed for moderate pain. 06/02/14  Yes Vickki HearingStanley E Harrison, MD  methocarbamol (ROBAXIN) 500 MG tablet Take 1 tablet (500 mg total) by mouth 4 (four) times daily. 05/27/14  Yes Vickki HearingStanley E Harrison, MD  promethazine (PHENERGAN) 12.5 MG tablet Take 12.5 mg by mouth every 6 (six) hours as needed for nausea or vomiting. 05/27/14  Yes Vickki HearingStanley E Harrison, MD   BP 152/88  Pulse 93  Temp(Src) 98.2 F (36.8 C) (Oral)  Resp 19  Ht 5\' 9"  (1.753 m)  Wt 240 lb (108.863 kg)  BMI 35.43 kg/m2  SpO2 93%  LMP 05/16/2014 Physical Exam  Nursing note and vitals reviewed. Constitutional: She is oriented to person, place, and time. She appears well-developed and well-nourished.  HENT:  Head: Normocephalic and atraumatic.  Eyes: Conjunctivae and EOM are normal. Pupils are equal, round, and reactive to light.  Neck: Normal range of motion. Neck supple.  Cardiovascular: Normal rate, regular rhythm and normal heart sounds.  Pulmonary/Chest: Effort normal and breath sounds normal.  Abdominal: Soft. Bowel sounds are normal.  Musculoskeletal:  Right upper extremity:  Suture line anterior shoulder appears intact. No erythema or edema. No pus.  Swelling and tenderness with associated ecchymosis at distal biceps.  Neurological: She is alert and oriented to person, place, and time.  Skin: Skin is warm and dry.  Psychiatric: She has a normal mood and affect. Her behavior is normal.    ED Course  Procedures (including critical care time) Labs Review Labs Reviewed  CBC WITH DIFFERENTIAL - Abnormal; Notable for the following:    Hemoglobin 15.2 (*)    All other components within normal limits  BASIC METABOLIC  PANEL - Abnormal; Notable for the following:    Glucose, Bld 178 (*)    All other components within normal limits    Imaging Review Dg Shoulder Right  06/04/2014   CLINICAL DATA:  Bruising to bicep area today, rotator cuff surgery 8 days ago, no injury today  EXAM: RIGHT SHOULDER - 2+ VIEW  COMPARISON:  None.  FINDINGS: No fracture or dislocation involving the proximal humerus.  IMPRESSION: No focal osseous abnormalities.   Electronically Signed   By: Esperanza Heiraymond  Rubner M.D.   On: 06/04/2014 13:59   [place] Venous Img Upper Uni Right  06/04/2014   CLINICAL DATA:  43 year old female with right upper extremity swelling following right shoulder surgery.  EXAM: RIGHT UPPER EXTREMITY VENOUS DOPPLER ULTRASOUND  TECHNIQUE: Gray-scale sonography with graded compression, as well as color Doppler and duplex ultrasound were performed to evaluate the upper extremity deep venous system from the level of the subclavian vein and including the jugular, axillary, basilic, radial, ulnar and upper cephalic vein. Spectral Doppler was utilized to evaluate flow at rest and with distal augmentation maneuvers.  COMPARISON:  None.  FINDINGS: Internal Jugular Vein: No evidence of thrombus. Normal compressibility, respiratory phasicity and response to augmentation.  Subclavian Vein: No evidence of thrombus. Normal compressibility, respiratory phasicity and response to augmentation.  Axillary Vein: No evidence of thrombus. Normal compressibility, respiratory phasicity and response to augmentation.  Cephalic Vein: No evidence of thrombus. Normal compressibility, respiratory phasicity and response to augmentation.  Basilic Vein: No evidence of thrombus. Normal compressibility, respiratory phasicity and response to augmentation.  Brachial Veins: No evidence of thrombus. Normal compressibility, respiratory phasicity and response to augmentation.  Radial Veins: No evidence of thrombus. Normal compressibility, respiratory phasicity and response to  augmentation.  Ulnar Veins: No evidence of thrombus. Normal compressibility, respiratory phasicity and response to augmentation.  Venous Reflux:  None visualized.  Other Findings:  None visualized.  IMPRESSION: No evidence of deep venous thrombosis.   Electronically Signed   By: Laveda AbbeJeff  Hu M.D.   On: 06/04/2014 14:39     EKG Interpretation None      MDM   Final diagnoses:  Pain of right upper extremity    Doppler study right upper extremity shows no DVT. No clinical evidence of cellulitis of wound.  White count normal. Glucose slightly elevated. These findings were discussed with the patient. She will followup with orthopedic surgeon this week    Donnetta HutchingBrian Cook, MD 06/04/14 1524

## 2014-06-04 NOTE — ED Notes (Signed)
Pt A&O, nad, clear dressing placed back on right shoulder. Pt able to put sling on without difficulties. Pulses and sensation WNL right arm.

## 2014-06-07 ENCOUNTER — Telehealth: Payer: Self-pay | Admitting: *Deleted

## 2014-06-07 NOTE — Telephone Encounter (Signed)
[facility], she wanted to make our office aware that the patient has no showed for her last four physical therapy appointments.

## 2014-06-12 NOTE — Telephone Encounter (Signed)
CALL HER AND ASK y ?

## 2014-06-13 ENCOUNTER — Ambulatory Visit (INDEPENDENT_AMBULATORY_CARE_PROVIDER_SITE_OTHER): Payer: Worker's Compensation | Admitting: Orthopedic Surgery

## 2014-06-13 ENCOUNTER — Encounter: Payer: Self-pay | Admitting: Orthopedic Surgery

## 2014-06-13 VITALS — BP 114/68 | Ht 69.0 in | Wt 240.0 lb

## 2014-06-13 DIAGNOSIS — S43421D Sprain of right rotator cuff capsule, subsequent encounter: Secondary | ICD-10-CM

## 2014-06-13 DIAGNOSIS — Z9889 Other specified postprocedural states: Secondary | ICD-10-CM

## 2014-06-13 DIAGNOSIS — Z5189 Encounter for other specified aftercare: Secondary | ICD-10-CM

## 2014-06-13 DIAGNOSIS — S43429A Sprain of unspecified rotator cuff capsule, initial encounter: Secondary | ICD-10-CM

## 2014-06-13 MED ORDER — HYDROCODONE-ACETAMINOPHEN 5-325 MG PO TABS: 1.0000 | ORAL_TABLET | ORAL | Status: DC | PRN

## 2014-06-13 NOTE — Progress Notes (Signed)
Patient ID: Alexandria PeltMichelle M Mejia, female   DOB: 1971/06/29, 43 y.o.   MRN: [id number]   Chief Complaint  Patient presents with  . Follow-up    post op 2, suture removal, Right shoulder RC surgery 05/27/14    The therapy is being done in hand and rehabilitation, she has missed a couple therapy appointments because she doesn't have arrived. She is advised to call her workers Manufacturing systems engineercompensation nurse to let her know when she can't get to her appointments.  Her wound looks good her sutures removed her pain is controlled she asked to go on 5 mg of Norco instead of Percocet or 10 mg. She can remove her sling in 3 weeks  Continue therapy followup in 3 weeks

## 2014-06-13 NOTE — Patient Instructions (Signed)
Extend out of work note for 8 more weeks

## 2014-06-15 ENCOUNTER — Telehealth: Payer: Self-pay | Admitting: Orthopedic Surgery

## 2014-06-15 NOTE — Telephone Encounter (Signed)
Regarding date of service, 06/13/14, Notes faxed to Workers [employer], [employer], attention Ian MalkinSherri McCormick, nurse case manager to fax# [phone]. (No authorization needed.) In addition, copy of notes provided to patient for her short-term disability [employer] authorization on file for this insurer.

## 2014-06-16 ENCOUNTER — Ambulatory Visit (INDEPENDENT_AMBULATORY_CARE_PROVIDER_SITE_OTHER): Payer: Worker's Compensation | Admitting: Orthopedic Surgery

## 2014-06-16 ENCOUNTER — Encounter: Payer: Self-pay | Admitting: Orthopedic Surgery

## 2014-06-16 DIAGNOSIS — Z9889 Other specified postprocedural states: Secondary | ICD-10-CM

## 2014-06-16 DIAGNOSIS — Z5189 Encounter for other specified aftercare: Secondary | ICD-10-CM

## 2014-06-16 DIAGNOSIS — S43421D Sprain of right rotator cuff capsule, subsequent encounter: Secondary | ICD-10-CM

## 2014-06-16 DIAGNOSIS — S43429A Sprain of unspecified rotator cuff capsule, initial encounter: Secondary | ICD-10-CM

## 2014-06-16 NOTE — Patient Instructions (Addendum)
Wipe with alcohol and band aide daily Keep previously scheduled appointment

## 2014-06-16 NOTE — Progress Notes (Signed)
Patient ID: Alexandria Mejia, female   DOB: 1971/07/14, 43 y.o.   MRN: [id number] Postop rotator cuff repair patient thought she felt a suture along the lateral suture line. She came in there is something about midway lateral to the suture line can't pull it out I don't think it's a suture not sure exactly what it is advice is to keep alcohol on it once a day and then cover with a Band-Aid until it declares itself otherwise keep regular appointments

## 2014-06-20 NOTE — Telephone Encounter (Signed)
Patient came in 06/13/14 and discussed with Dr. Romeo AppleHarrison,

## 2014-06-27 ENCOUNTER — Telehealth: Payer: Self-pay | Admitting: Radiology

## 2014-06-27 DIAGNOSIS — S43421D Sprain of right rotator cuff capsule, subsequent encounter: Secondary | ICD-10-CM

## 2014-06-27 NOTE — Telephone Encounter (Signed)
Needs refill on her pain medication. Her new number is [phone].

## 2014-06-28 ENCOUNTER — Encounter (HOSPITAL_COMMUNITY): Payer: Self-pay | Admitting: Emergency Medicine

## 2014-06-28 ENCOUNTER — Emergency Department (HOSPITAL_COMMUNITY): Payer: Worker's Compensation

## 2014-06-28 ENCOUNTER — Emergency Department (HOSPITAL_COMMUNITY)
Admission: EM | Admit: 2014-06-28 | Discharge: 2014-06-28 | Disposition: A | Discharge status: Home / Self Care | Payer: Worker's Compensation | Attending: Emergency Medicine | Admitting: Emergency Medicine

## 2014-06-28 DIAGNOSIS — M25519 Pain in unspecified shoulder: Secondary | ICD-10-CM | POA: Insufficient documentation

## 2014-06-28 DIAGNOSIS — Z8679 Personal history of other diseases of the circulatory system: Secondary | ICD-10-CM | POA: Insufficient documentation

## 2014-06-28 DIAGNOSIS — I499 Cardiac arrhythmia, unspecified: Secondary | ICD-10-CM | POA: Insufficient documentation

## 2014-06-28 DIAGNOSIS — Z8719 Personal history of other diseases of the digestive system: Secondary | ICD-10-CM | POA: Insufficient documentation

## 2014-06-28 DIAGNOSIS — M25511 Pain in right shoulder: Secondary | ICD-10-CM

## 2014-06-28 DIAGNOSIS — Z79899 Other long term (current) drug therapy: Secondary | ICD-10-CM | POA: Insufficient documentation

## 2014-06-28 DIAGNOSIS — F17200 Nicotine dependence, unspecified, uncomplicated: Secondary | ICD-10-CM | POA: Insufficient documentation

## 2014-06-28 MED ORDER — HYDROCODONE-ACETAMINOPHEN 5-325 MG PO TABS
2.0000 | ORAL_TABLET | Freq: Once | ORAL | Status: AC
  Administered 2014-06-28: 2 via ORAL
  Filled 2014-06-28: qty 2

## 2014-06-28 MED ORDER — HYDROCODONE-ACETAMINOPHEN 5-325 MG PO TABS: 1.0000 | ORAL_TABLET | ORAL | Status: DC | PRN

## 2014-06-28 MED ORDER — IBUPROFEN 600 MG PO TABS: 600.0000 mg | ORAL_TABLET | Freq: Four times a day (QID) | ORAL | Status: DC | PRN

## 2014-06-28 MED ORDER — ONDANSETRON HCL 8 MG PO TABS: 4.0000 mg | ORAL_TABLET | Freq: Three times a day (TID) | ORAL | Status: DC | PRN

## 2014-06-28 MED ORDER — HYDROCODONE-ACETAMINOPHEN 5-325 MG PO TABS: 2.0000 | ORAL_TABLET | ORAL | Status: DC | PRN

## 2014-06-28 MED ORDER — IBUPROFEN 800 MG PO TABS
800.0000 mg | ORAL_TABLET | Freq: Once | ORAL | Status: AC
  Administered 2014-06-28: 800 mg via ORAL
  Filled 2014-06-28: qty 1

## 2014-06-28 NOTE — ED Notes (Signed)
Pt had shoulder surgery June 12th, pain is worse, burning pain, radiating into back and neck. called surgeon they are on vacation.

## 2014-06-28 NOTE — ED Provider Notes (Signed)
CSN: [id number]     Arrival date & time 06/28/14  2033 History   First MD Initiated Contact with Patient 06/28/14 2102     Chief Complaint  Patient presents with  . Shoulder Pain     (Consider location/radiation/quality/duration/timing/severity/associated sxs/prior Treatment) The history is provided by the patient.    Alexandria Mejia is a 43 y.o. female who is currently 1 month out from right rotator cuff surgery presenting with increased pain and a sharp burning sensation in her right shoulder which radiates across her shoulder to her neck and into her mid right bicep which is worsened over the past 5 days.  She has been going to physical therapy and 5 days ago she felt and heard a loud popping sensation after which time the symptoms began.  She last had PT yesterday and believes the manipulation made her worse.  She is taking hydrocodone without relief of symptoms.     Past Medical History  Diagnosis Date  . Back pain   . GERD (gastroesophageal reflux disease)   . Migraine   . Complication of anesthesia     patient states "with last surgery I was hard to wake up".  . Dysrhythmia     pt states "I have an irregular heart rate and it skips a beat".   Past Surgical History  Procedure Laterality Date  . Cholecystectomy    . Appendectomy    . Neck surgery      decompression of 7 disc  . Back surgery      lumbar-herniated disc  . Shoulder arthroscopy with rotator cuff repair Right 05/27/2014    Procedure: SHOULDER ARTHROSCOPY WITH ROTATOR CUFF REPAIR, subscalpularis repair, open supraspinatus repair;  Surgeon: Vickki HearingStanley E Harrison, MD;  Location: [facility];  Service: Orthopedics;  Laterality: Right;   Family History  Problem Relation Age of Onset  . COPD Mother   . Arthritis Mother   . Diabetes Father   . Heart disease Father   . Hypertension Brother   . Diabetes Maternal Grandmother   . Diabetes Paternal Grandmother   . Dementia Paternal Grandfather    History  Substance  Use Topics  . Smoking status: Current Some Day Smoker -- 0.25 packs/day for 30 years    Types: Cigarettes  . Smokeless tobacco: Never Used  . Alcohol Use: No   OB History   Grav Para Term Preterm Abortions TAB SAB Ect Mult Living                 Review of Systems  Constitutional: Negative for fever.  Musculoskeletal: Positive for arthralgias and joint swelling. Negative for myalgias.  Neurological: Negative for weakness and numbness.      Allergies  Codeine and Sulfa antibiotics  Home Medications   Prior to Admission medications   Medication Sig Start Date End Date Taking? Authorizing Provider  methocarbamol (ROBAXIN) 500 MG tablet Take 1 tablet (500 mg total) by mouth 4 (four) times daily. 05/27/14  Yes Vickki HearingStanley E Harrison, MD  HYDROcodone-acetaminophen (NORCO/VICODIN) 5-325 MG per tablet Take 1-2 tablets by mouth every 4 (four) hours as needed. 06/28/14   Burgess AmorJulie Mairead Schwarzkopf, PA-C  HYDROcodone-acetaminophen (NORCO/VICODIN) 5-325 MG per tablet Take 2 tablets by mouth every 4 (four) hours as needed for moderate pain or severe pain. 06/28/14   Burgess AmorJulie Delanee Xin, PA-C  ibuprofen (ADVIL,MOTRIN) 600 MG tablet Take 1 tablet (600 mg total) by mouth every 6 (six) hours as needed. 06/28/14   Burgess AmorJulie Paton Crum, PA-C  ondansetron (ZOFRAN) 8 MG tablet  Take 0.5 tablets (4 mg total) by mouth every 8 (eight) hours as needed for nausea or vomiting. 06/28/14   Burgess Amor, PA-C   BP 130/63  Pulse 75  Temp(Src) 97.8 F (36.6 C) (Oral)  Resp 18  Ht 5\' 9"  (1.753 m)  Wt 230 lb (104.327 kg)  BMI 33.95 kg/m2  SpO2 99%  LMP 06/14/2014 Physical Exam  Constitutional: She appears well-developed and well-nourished.  HENT:  Head: Atraumatic.  Neck: Normal range of motion.  Cardiovascular:  Pulses equal bilaterally  Musculoskeletal: She exhibits tenderness.       Right shoulder: She exhibits tenderness and bony tenderness. She exhibits no effusion, no crepitus, no deformity, no spasm and normal pulse.  TTP at shoulder  joint, deltoid to mid humerus.  No ecchymosis.  No appreciable edema.   Neurological: She is alert. She has normal strength. She displays normal reflexes. No sensory deficit.  Skin: Skin is warm and dry.  Psychiatric: She has a normal mood and affect.    ED Course  Procedures (including critical care time) Labs Review Labs Reviewed - No data to display  Imaging Review Dg Shoulder Right  06/28/2014   CLINICAL DATA:  SHOULDER PAIN  EXAM: RIGHT SHOULDER - 2+ VIEW  COMPARISON:  None.  FINDINGS: There is no evidence of fracture or dislocation. There is no evidence of arthropathy or other focal bone abnormality. Soft tissues are unremarkable.  IMPRESSION: Negative.   Electronically Signed   By: Rise Mu M.D.   On: 06/28/2014 22:12     EKG Interpretation None      MDM   Final diagnoses:  Shoulder joint pain, right   Patients labs and/or radiological studies were viewed and considered during the medical decision making and disposition process. Pt with post op pain s/p rotator cuff repair.  Suspicious for new tearing given popping sensation at PT and now increased worse pain.  She reports having one more PT session this week prior to returning to see Dr. Romeo Apple in 6 days.  Advised to hold this PT session until rechecked by ortho.  She was prescribed hydrocodone and ibuprofen. She also states the pain causes nausea, zofran prescribed.  She is wearing a sling, advised continued use.    Burgess Amor, PA-C 06/30/14 1027

## 2014-06-28 NOTE — Discharge Instructions (Signed)
Shoulder Pain The shoulder is the joint that connects your arms to your body. The bones that form the shoulder joint include the upper arm bone (humerus), the shoulder blade (scapula), and the collarbone (clavicle). The top of the humerus is shaped like a ball and fits into a rather flat socket on the scapula (glenoid cavity). A combination of muscles and strong, fibrous tissues that connect muscles to bones (tendons) support your shoulder joint and hold the ball in the socket. Small, fluid-filled sacs (bursae) are located in different areas of the joint. They act as cushions between the bones and the overlying soft tissues and help reduce friction between the gliding tendons and the bone as you move your arm. Your shoulder joint allows a wide range of motion in your arm. This range of motion allows you to do things like scratch your back or throw a ball. However, this range of motion also makes your shoulder more prone to pain from overuse and injury. Causes of shoulder pain can originate from both injury and overuse and usually can be grouped in the following four categories:  Redness, swelling, and pain (inflammation) of the tendon (tendinitis) or the bursae (bursitis).  Instability, such as a dislocation of the joint.  Inflammation of the joint (arthritis).  Broken bone (fracture). HOME CARE INSTRUCTIONS   Apply ice to the sore area.  Put ice in a plastic bag.  Place a towel between your skin and the bag.  Leave the ice on for 15-20 minutes, 3-4 times per day for the first 2 days, or as directed by your health care provider.  Stop using cold packs if they do not help with the pain.  If you have a shoulder sling or immobilizer, wear it as long as your caregiver instructs. Only remove it to shower or bathe. Move your arm as little as possible, but keep your hand moving to prevent swelling.  Squeeze a soft ball or foam pad as much as possible to help prevent swelling.  Only take  over-the-counter or prescription medicines for pain, discomfort, or fever as directed by your caregiver. SEEK MEDICAL CARE IF:   Your shoulder pain increases, or new pain develops in your arm, hand, or fingers.  Your hand or fingers become cold and numb.  Your pain is not relieved with medicines. SEEK IMMEDIATE MEDICAL CARE IF:   Your arm, hand, or fingers are numb or tingling.  Your arm, hand, or fingers are significantly swollen or turn white or blue. MAKE SURE YOU:   Understand these instructions.  Will watch your condition.  Will get help right away if you are not doing well or get worse. Document Released: 09/11/2005 Document Revised: 12/07/2013 Document Reviewed: 11/16/2011 [facility] Patient Information 2015 [place] [place]. This information is not intended to replace advice given to you by your health care provider. Make sure you discuss any questions you have with your health care provider.    Take the increased dose of hydrocodone if needed.  Add the ibuprofen to help with inflammation and pain.  Apply a heating pad 20 minutes several times daily.  See Dr Romeo AppleHarrison (or Dr Hilda LiasKeeling if needed before Dr Romeo AppleHarrison is back in town).  I do not recommend you go to physical therapy until you have been re-evaluated by your orthopedic surgeon.

## 2014-06-28 NOTE — Telephone Encounter (Signed)
Patient called for refill on her medication also notes shoulder burning and aching

## 2014-06-29 ENCOUNTER — Telehealth: Payer: Self-pay | Admitting: Radiology

## 2014-06-29 ENCOUNTER — Telehealth: Payer: Self-pay | Admitting: *Deleted

## 2014-06-29 NOTE — Telephone Encounter (Signed)
Opened in error

## 2014-06-29 NOTE — Telephone Encounter (Signed)
Spoke with patient yesterday. Patient was experiencing some burning pain and out of pain medication, advised that Dr Romeo AppleHarrison out of the office until next week, may need to be seen by on call, advised would call her back to let her know, established contact with Dr Romeo AppleHarrison who had prescription ready for patient, called patient to advise prescription available, no return call from patient. Received call today from physical therapy that patient had cancelled appointment. Called patient again, no answer. Patient returned call, states she went to ER yesterday, ER doctor took her out of physical therapy until she is seen by Ortho. Will call Dr Sanjuan DameKeeling's office to see if she can be seen there. Called Dr Sanjuan DameKeeling's office, Dr Hilda LiasKeeling to review patient notes and call patient with appointment if necessary. Called to advise patient, no answer, left voicemail.

## 2014-06-29 NOTE — Telephone Encounter (Signed)
Angie called from Dr. Sanjuan DameKeeling's office and said that Dr. Hilda LiasKeeling reviewed Alexandria Mejia's chart and said he would see her. Angie tried to call the patient and had to leave her a message. I also called the patient trying to get in touch with her. We are waiting to here back from the patient.

## 2014-07-01 NOTE — ED Provider Notes (Signed)
Medical screening examination/treatment/procedure(s) were performed by non-physician practitioner and as supervising physician I was immediately available for consultation/collaboration.   EKG Interpretation None        Christopher J. Pollina, MD 07/01/14 1504 

## 2014-07-04 ENCOUNTER — Encounter: Payer: Self-pay | Admitting: Orthopedic Surgery

## 2014-07-04 ENCOUNTER — Ambulatory Visit (INDEPENDENT_AMBULATORY_CARE_PROVIDER_SITE_OTHER): Payer: Worker's Compensation | Admitting: Orthopedic Surgery

## 2014-07-04 VITALS — BP 110/71 | Ht 69.0 in | Wt 230.0 lb

## 2014-07-04 DIAGNOSIS — S43429A Sprain of unspecified rotator cuff capsule, initial encounter: Secondary | ICD-10-CM

## 2014-07-04 DIAGNOSIS — S43421D Sprain of right rotator cuff capsule, subsequent encounter: Secondary | ICD-10-CM

## 2014-07-04 DIAGNOSIS — Z9889 Other specified postprocedural states: Secondary | ICD-10-CM

## 2014-07-04 DIAGNOSIS — Z5189 Encounter for other specified aftercare: Secondary | ICD-10-CM

## 2014-07-04 MED ORDER — IBUPROFEN 800 MG PO TABS
800.0000 mg | ORAL_TABLET | Freq: Three times a day (TID) | ORAL | Status: DC
Start: 2014-07-04 — End: 2015-02-05

## 2014-07-04 MED ORDER — HYDROCODONE-ACETAMINOPHEN 10-325 MG PO TABS: 1.0000 | ORAL_TABLET | ORAL | Status: DC | PRN

## 2014-07-04 MED FILL — Oxycodone w/ Acetaminophen Tab 5-325 MG: ORAL | Qty: 6 | Status: CN

## 2014-07-04 MED FILL — Hydrocodone-Acetaminophen Tab 5-325 MG: ORAL | Qty: 6 | Status: AC

## 2014-07-04 NOTE — Patient Instructions (Signed)
Follow up in 1 week NO physical therapy until advised otherwise

## 2014-07-04 NOTE — Progress Notes (Signed)
Patient ID: Alexandria Mejia, female   DOB: July 15, 1971, 43 y.o.   MRN: [id number] Chief Complaint  Patient presents with  . Follow-up    post op right RCR, DOS 05/27/14    5 weeks postop open right rotator cuff repair. Patient was doing well until she had an episode in therapy while moving her arm she felt a pop and since that time she's had intense pain burning and aching in the right shoulder. She tried Percocet for pain she got mild pain relief but still having significant difficulty in her right arm. She went to the emergency room as well as local orthopedist who was covering for me when I was out of town. She was prescribed steroids but was able to get them because this is a workers compensation case.  She presents now with swelling in the right arm pain over the right shoulder scapula and cervical area.  At this point it is unclear if the popping sensation was a tear through the surgical repair. I would like to try continued ice, ibuprofen 803 times a day, hydrocodone 10 mg every 4 and 3 times a day out of the sling with the arm extended through the elbow to see if this gives relief. We will see her in a week if no improvement we will have to repeat her MRI with contrast.

## 2014-07-12 ENCOUNTER — Telehealth: Payer: Self-pay | Admitting: Orthopedic Surgery

## 2014-07-12 ENCOUNTER — Ambulatory Visit: Payer: Self-pay | Admitting: Orthopedic Surgery

## 2014-07-12 NOTE — Telephone Encounter (Signed)
Patient's appointment for today 07/12/14 was re-scheduled due to Dr Harrison's emergency surgery.  When I called patient to re-schedule to Thursday, 07/14/14, patient mentioned she will be needing refill of her pain medication, Norco 10;  Please advise.  Patient's phone #[phone]

## 2014-07-12 NOTE — Telephone Encounter (Signed)
Routing to Dr Harrison 

## 2014-07-13 ENCOUNTER — Other Ambulatory Visit: Payer: Self-pay | Admitting: *Deleted

## 2014-07-13 MED ORDER — HYDROCODONE-ACETAMINOPHEN 10-325 MG PO TABS: 1.0000 | ORAL_TABLET | ORAL | Status: DC | PRN

## 2014-07-13 NOTE — Telephone Encounter (Signed)
Refill

## 2014-07-13 NOTE — Telephone Encounter (Signed)
Prescription available for pick up, patient aware 

## 2014-07-14 ENCOUNTER — Encounter: Payer: Self-pay | Admitting: Orthopedic Surgery

## 2014-07-14 ENCOUNTER — Ambulatory Visit (INDEPENDENT_AMBULATORY_CARE_PROVIDER_SITE_OTHER): Payer: Worker's Compensation | Admitting: Orthopedic Surgery

## 2014-07-14 VITALS — BP 116/79 | Ht 69.0 in | Wt 230.0 lb

## 2014-07-14 DIAGNOSIS — Z5189 Encounter for other specified aftercare: Secondary | ICD-10-CM

## 2014-07-14 DIAGNOSIS — S43421D Sprain of right rotator cuff capsule, subsequent encounter: Secondary | ICD-10-CM

## 2014-07-14 DIAGNOSIS — Z9889 Other specified postprocedural states: Secondary | ICD-10-CM

## 2014-07-14 DIAGNOSIS — S43429A Sprain of unspecified rotator cuff capsule, initial encounter: Secondary | ICD-10-CM

## 2014-07-14 MED ORDER — METHOCARBAMOL 500 MG PO TABS: 500.0000 mg | ORAL_TABLET | Freq: Four times a day (QID) | ORAL | Status: DC

## 2014-07-14 NOTE — Progress Notes (Signed)
Patient ID: Alexandria PeltMichelle M Mejia, female   DOB: 1970/12/28, 43 y.o.   MRN: [id number] Chief Complaint  Patient presents with  . Follow-up    one week follow up right RCR DOS 05/27/14    This is a seventh weeks status post rotator cuff repair right shoulder. The patient was doing well until she felt a pop while doing her therapy and since that time she's had increased shoulder pain. We stopped her therapy and placed on some new medications and told her that she could relieve some of the pressure and arm by straightening her elbow and some of the pain has gotten better. I tested her today an abduction internal rotation and external rotation and she did have good manual muscle strength at 30 abduction belly press test and external rotation total proximal with 30.  She still complains of burning sensation in her right upper extremity and into the hand. We had a preop study which was normal.  Recommend rest sling continue muscle relaxers pain medications ibuprofen which by the way is helping her with a throbbing sensation.  Return in a week hold therapy for now.

## 2014-07-21 ENCOUNTER — Encounter: Payer: Self-pay | Admitting: Orthopedic Surgery

## 2014-07-21 ENCOUNTER — Ambulatory Visit (INDEPENDENT_AMBULATORY_CARE_PROVIDER_SITE_OTHER): Payer: Worker's Compensation | Admitting: Orthopedic Surgery

## 2014-07-21 VITALS — BP 106/63 | Ht 69.0 in | Wt 230.0 lb

## 2014-07-21 DIAGNOSIS — S43429A Sprain of unspecified rotator cuff capsule, initial encounter: Secondary | ICD-10-CM

## 2014-07-21 DIAGNOSIS — M75101 Unspecified rotator cuff tear or rupture of right shoulder, not specified as traumatic: Secondary | ICD-10-CM

## 2014-07-21 MED ORDER — HYDROCODONE-ACETAMINOPHEN 10-325 MG PO TABS: 1.0000 | ORAL_TABLET | ORAL | Status: DC | PRN

## 2014-07-21 NOTE — Progress Notes (Signed)
Chief Complaint  Patient presents with  . Follow-up    1 week recheck Rt RCR, DOS 05/27/14    The patient's pain has improved she still has burning around the proximal aspect of the shoulder. I was able to move her 0-45 abduction 0-45 flexion 0-30 external rotation.  Start therapy again at hand in rehabilitation with the instructions I just gave  Refilled her medication  Followup 2 weeks

## 2014-07-25 ENCOUNTER — Telehealth: Payer: Self-pay | Admitting: Orthopedic Surgery

## 2014-07-25 NOTE — Telephone Encounter (Signed)
Faxed notes, including work note, physical therapy order, to [employer], Workers comp provider, attention: Ian MalkinSherri McCormick. Notified patient of status.

## 2014-08-04 ENCOUNTER — Encounter: Payer: Self-pay | Admitting: Orthopedic Surgery

## 2014-08-04 ENCOUNTER — Ambulatory Visit (INDEPENDENT_AMBULATORY_CARE_PROVIDER_SITE_OTHER): Payer: Worker's Compensation | Admitting: Orthopedic Surgery

## 2014-08-04 ENCOUNTER — Telehealth: Payer: Self-pay | Admitting: Orthopedic Surgery

## 2014-08-04 VITALS — BP 120/64 | Ht 69.0 in | Wt 230.0 lb

## 2014-08-04 DIAGNOSIS — S43429A Sprain of unspecified rotator cuff capsule, initial encounter: Secondary | ICD-10-CM

## 2014-08-04 DIAGNOSIS — Z9889 Other specified postprocedural states: Secondary | ICD-10-CM

## 2014-08-04 DIAGNOSIS — M75101 Unspecified rotator cuff tear or rupture of right shoulder, not specified as traumatic: Secondary | ICD-10-CM

## 2014-08-04 MED ORDER — HYDROCODONE-ACETAMINOPHEN 10-325 MG PO TABS: 1.0000 | ORAL_TABLET | ORAL | Status: DC | PRN

## 2014-08-04 NOTE — Patient Instructions (Signed)
Continue therapy, no changes

## 2014-08-04 NOTE — Progress Notes (Signed)
Chief Complaint  Patient presents with  . Follow-up    2 week recheck Right RCR, DOS 05/27/14    Weeks status post rotator cuff repair right shoulder did well until she had an episode in therapy when they externally rotated her arm she felt a pop and then had burning pain he can move the arm we had to shut her down basically for 3 weeks to let things settle down I think they have she's doing better passive range of motion gentle exercises for the next 4 weeks and then return and we may be advanced at that point.  Meds ordered this encounter  Medications  . HYDROcodone-acetaminophen (NORCO) 10-325 MG per tablet    Sig: Take 1 tablet by mouth every 4 (four) hours as needed.    Dispense:  60 tablet    Refill:  0

## 2014-08-04 NOTE — Telephone Encounter (Signed)
Returned call, advised no changes per Dr Romeo AppleHarrison

## 2014-08-04 NOTE — Telephone Encounter (Signed)
Alexandria Mejia with [facility] has some questions about Alexandria Mejia's therapy.  Asked for you to call at [phone]

## 2014-08-09 ENCOUNTER — Telehealth: Payer: Self-pay | Admitting: Orthopedic Surgery

## 2014-08-09 NOTE — Telephone Encounter (Signed)
Notes faxed (dates of service 07/21/14(re-faxed) and 08/04/14 service to [employer], attention Ian Malkin, fax# [phone], as requested.

## 2014-08-17 ENCOUNTER — Telehealth: Payer: Self-pay | Admitting: Orthopedic Surgery

## 2014-08-17 ENCOUNTER — Other Ambulatory Visit: Payer: Self-pay | Admitting: *Deleted

## 2014-08-17 MED ORDER — HYDROCODONE-ACETAMINOPHEN 10-325 MG PO TABS: 1.0000 | ORAL_TABLET | ORAL | Status: DC | PRN

## 2014-08-17 NOTE — Telephone Encounter (Signed)
Patient called to request refill on medication HYDROcodone-acetaminophen (NORCO) 10-325 MG per tablet [[id number]] Please advise [phone]

## 2014-08-18 NOTE — Telephone Encounter (Signed)
Patient called, and picked up prescription today, 08/18/2014.

## 2014-08-18 NOTE — Telephone Encounter (Signed)
Called patient, unavailable, left message for return cal

## 2014-08-18 NOTE — Telephone Encounter (Signed)
Prescription available for pick, please advise when patient calls back

## 2014-09-01 ENCOUNTER — Other Ambulatory Visit: Payer: Self-pay | Admitting: *Deleted

## 2014-09-01 ENCOUNTER — Ambulatory Visit (INDEPENDENT_AMBULATORY_CARE_PROVIDER_SITE_OTHER): Payer: Worker's Compensation | Admitting: Orthopedic Surgery

## 2014-09-01 ENCOUNTER — Encounter: Payer: Self-pay | Admitting: Orthopedic Surgery

## 2014-09-01 VITALS — Ht 69.0 in | Wt 230.0 lb

## 2014-09-01 DIAGNOSIS — Z9889 Other specified postprocedural states: Secondary | ICD-10-CM

## 2014-09-01 DIAGNOSIS — S43429A Sprain of unspecified rotator cuff capsule, initial encounter: Secondary | ICD-10-CM

## 2014-09-01 DIAGNOSIS — M75101 Unspecified rotator cuff tear or rupture of right shoulder, not specified as traumatic: Secondary | ICD-10-CM

## 2014-09-01 MED ORDER — HYDROCODONE-ACETAMINOPHEN 10-325 MG PO TABS: 1.0000 | ORAL_TABLET | ORAL | Status: DC | PRN

## 2014-09-01 NOTE — Patient Instructions (Addendum)
Workers Psychologist, educational with contrast   Continue therapy [facility] Re[facility]   Continue sling   OOW

## 2014-09-01 NOTE — Progress Notes (Signed)
The patient rotator cuff surgery in June she is doing well until she had an external rotation exercise in therapy follow pop and then she could move her arm. We sugar down for several weeks to see if this was just inflammation or if there was something more serious and she improved and comes in for followup to see if we can advance her therapy.  She is now 17 weeks postop. She's not doing well at all.  She has 2 main problems one she has burning pain radiating up into her neck on the right side with some mild tenderness in the cervical spine this pain radiates from the neck out into the right shoulder.  She has a toothache-like pain in the right shoulder as well and she has not regained her full range of motion  I think at this point it would be prudent to perform an MRI with contrast to see if we have healing of the rotator cuff repair. She'll be placed in a sling continue physical therapy and stay out of work and take the following medications including Prilosec secondary to ibuprofen causing GI upset

## 2014-09-06 ENCOUNTER — Telehealth: Payer: Self-pay | Admitting: Orthopedic Surgery

## 2014-09-06 NOTE — Telephone Encounter (Signed)
Call received from Workers Comp 3rd party pre-authorization contact, [employer], Jonny Ruiz, asking for verification of MRI with contrast, which order indicates.  Please also include order for arthrogram, and fax to # [phone], ph# [phone], for approval to be done and imaging to be scheduled.

## 2014-09-07 ENCOUNTER — Other Ambulatory Visit: Payer: Self-pay | Admitting: *Deleted

## 2014-09-07 ENCOUNTER — Telehealth: Payer: Self-pay | Admitting: Orthopedic Surgery

## 2014-09-07 DIAGNOSIS — M75101 Unspecified rotator cuff tear or rupture of right shoulder, not specified as traumatic: Secondary | ICD-10-CM

## 2014-09-07 MED ORDER — OMEPRAZOLE 20 MG PO CPDR: 20.0000 mg | DELAYED_RELEASE_CAPSULE | Freq: Every day | ORAL | Status: DC

## 2014-09-07 NOTE — Telephone Encounter (Signed)
Advised to hold on PT until after we get MRI results back due to increased pain

## 2014-09-07 NOTE — Telephone Encounter (Signed)
Call from [facility], therapist from [facility], [place], states had done limited (per Dr Mort Sawyers orders) therapy visit yesterday, and has information to relay

## 2014-09-07 NOTE — Telephone Encounter (Signed)
Also sent script for Prilosec as directed at office visit and wasn't sent in originally   Patient asking if okay to use a heating pad

## 2014-09-07 NOTE — Telephone Encounter (Signed)
done

## 2014-09-08 NOTE — Telephone Encounter (Signed)
Called number listed in chart, # disconnected

## 2014-09-08 NOTE — Telephone Encounter (Signed)
yes

## 2014-09-12 NOTE — Telephone Encounter (Signed)
MRI order faxed as noted to [employer] - MRI appointment has been scheduled at [facility], on 09/14/14, 9:45am; message left for patient and for Workers comp nurse case manager, Darl Pikes, ph [phone]. Patient is aware of follow/up appointment here, for results.

## 2014-09-15 ENCOUNTER — Other Ambulatory Visit: Payer: Self-pay | Admitting: *Deleted

## 2014-09-15 ENCOUNTER — Telehealth: Payer: Self-pay | Admitting: Orthopedic Surgery

## 2014-09-15 MED ORDER — HYDROCODONE-ACETAMINOPHEN 10-325 MG PO TABS: 1.0000 | ORAL_TABLET | ORAL | Status: DC | PRN

## 2014-09-15 NOTE — Telephone Encounter (Signed)
Patient is asking for a refill on pain medication HYDROcodone-acetaminophen (NORCO) 10-325 MG per tablet, please advise? 

## 2014-09-15 NOTE — Telephone Encounter (Signed)
Prescription available, called patient, no answer 

## 2014-09-19 NOTE — Telephone Encounter (Signed)
Patient picked up Rx

## 2014-09-20 ENCOUNTER — Ambulatory Visit: Payer: BC Managed Care – PPO | Admitting: Orthopedic Surgery

## 2014-09-22 ENCOUNTER — Encounter: Payer: Self-pay | Admitting: Orthopedic Surgery

## 2014-09-22 ENCOUNTER — Ambulatory Visit (INDEPENDENT_AMBULATORY_CARE_PROVIDER_SITE_OTHER): Payer: Worker's Compensation | Admitting: Orthopedic Surgery

## 2014-09-22 ENCOUNTER — Ambulatory Visit: Payer: BC Managed Care – PPO | Admitting: Orthopedic Surgery

## 2014-09-22 VITALS — BP 122/65 | Ht 69.0 in | Wt 230.0 lb

## 2014-09-22 DIAGNOSIS — Z9889 Other specified postprocedural states: Secondary | ICD-10-CM

## 2014-09-22 DIAGNOSIS — M75101 Unspecified rotator cuff tear or rupture of right shoulder, not specified as traumatic: Secondary | ICD-10-CM

## 2014-09-22 NOTE — Patient Instructions (Signed)
We will be in touch about scheduling surgery

## 2014-09-22 NOTE — Progress Notes (Signed)
Chief Complaint  Patient presents with  . Results    MRI review Rt shoulder    I reviewed the MRI and report regarding the patient's MRI and contrast right shoulder she has a new tear of her rotator cuff and will need surgery again with an open rotator cuff repair this time. We can use part of the same incision. I've informed her of her salt and advised her that we will need to get this approved through the carrier. The only thing that we can document that caused a change in her course was the day that she had therapy and felt a loud pop when she tried to go into extreme flexion.  In any event it is prudent to get this done as soon as possible to delay any further treatment and make her period and more difficult.  She has undergone a reasonable and significant course of therapy to try to alleviate her symptoms without any success.  The official reading of her MRI is as follows longitudinal split tear distal subscapularis tendon at the insertion which extends to the articular surface at the superior portion there is also recurrent anterior insertional supraspinatus tendon tear involving the articular surface with probable small full-thickness perforation account for extravasation of contrast into the subacromial and subdeltoid space.

## 2014-09-26 ENCOUNTER — Telehealth: Payer: Self-pay | Admitting: Orthopedic Surgery

## 2014-09-26 ENCOUNTER — Other Ambulatory Visit: Payer: Self-pay | Admitting: *Deleted

## 2014-09-26 MED ORDER — HYDROCODONE-ACETAMINOPHEN 10-325 MG PO TABS: 1.0000 | ORAL_TABLET | ORAL | Status: DC | PRN

## 2014-09-26 NOTE — Telephone Encounter (Signed)
Patient is calling asking for a refill on pain med HYDROcodone-acetaminophen (NORCO) 10-325 MG per tablet please advise?

## 2014-09-26 NOTE — Telephone Encounter (Signed)
Patient picked up Rx

## 2014-09-26 NOTE — Telephone Encounter (Signed)
Prescription available, called patient, no answer 

## 2014-09-26 NOTE — Telephone Encounter (Signed)
Notes for date of service 09/22/14 faxed to [employer], workers' comp insurer, to attention Ian MalkinSherri McCormick and/or nurse case manager Darrelyn HillockSusan Kesler, fax# [phone]/ph# [phone]; patient aware of status. Awaiting response regarding approval for surgery.

## 2014-09-27 NOTE — Telephone Encounter (Signed)
Received call back from nurse case manager, Darl PikesSusan - states patient's notes are in utilization review; response to follow as soon as possible.  Patient aware of status.

## 2014-09-28 NOTE — Telephone Encounter (Signed)
Pre-Authorization received for surgery from Workers [employer], [employer]; their direct ph# [phone]; fax [phone]. Contacted patient to relay status.  Entering separate note to nurse regarding schedule of surgery.

## 2014-10-02 ENCOUNTER — Telehealth: Payer: Self-pay | Admitting: Orthopedic Surgery

## 2014-10-03 NOTE — Telephone Encounter (Signed)
Peer to peer review required for surgery, information in your box

## 2014-10-03 NOTE — Telephone Encounter (Signed)
Disregard, called [employer] and was advised surgery is already authorized, no peer to peer needed  Please advise of any ADDITIONAL special needs or regular ones?

## 2014-10-03 NOTE — Telephone Encounter (Signed)
Patient is calling to find out for surgery date & time, Please advise?

## 2014-10-03 NOTE — Telephone Encounter (Signed)
What is a PTP ??

## 2014-10-03 NOTE — Telephone Encounter (Signed)
Routing to Dr Harrison 

## 2014-10-04 ENCOUNTER — Telehealth: Payer: Self-pay | Admitting: *Deleted

## 2014-10-04 NOTE — Telephone Encounter (Signed)
Routing to Dr Harrison 

## 2014-10-04 NOTE — Telephone Encounter (Signed)
Patient called regarding her upcoming surgery that is to be scheduled. She states she spoke with Workers [employer] and is aware that it has been approved. I advised her that you were working on this, and you would contact her with scheduling details. Her number is [phone].

## 2014-10-05 ENCOUNTER — Other Ambulatory Visit: Payer: Self-pay | Admitting: *Deleted

## 2014-10-06 ENCOUNTER — Other Ambulatory Visit: Payer: Self-pay | Admitting: *Deleted

## 2014-10-06 ENCOUNTER — Telehealth: Payer: Self-pay | Admitting: Orthopedic Surgery

## 2014-10-06 ENCOUNTER — Encounter (HOSPITAL_COMMUNITY): Payer: Self-pay | Admitting: Pharmacy Technician

## 2014-10-06 MED ORDER — HYDROCODONE-ACETAMINOPHEN 10-325 MG PO TABS: 1.0000 | ORAL_TABLET | ORAL | Status: DC | PRN

## 2014-10-06 NOTE — Telephone Encounter (Signed)
Prescription available, patient aware  

## 2014-10-06 NOTE — Telephone Encounter (Signed)
Patient is calling asking for a refill on her pain medHYDROcodone-acetaminophen (NORCO) 10-325 MG per tablet please advise?

## 2014-10-07 ENCOUNTER — Encounter (HOSPITAL_COMMUNITY): Payer: Self-pay

## 2014-10-07 ENCOUNTER — Encounter (HOSPITAL_COMMUNITY)
Admission: RE | Admit: 2014-10-07 | Discharge: 2014-10-07 | Disposition: A | Discharge status: Home / Self Care | Payer: Worker's Compensation | Source: Ambulatory Visit | Attending: Orthopedic Surgery | Admitting: Orthopedic Surgery

## 2014-10-07 DIAGNOSIS — Z01812 Encounter for preprocedural laboratory examination: Secondary | ICD-10-CM | POA: Diagnosis present

## 2014-10-07 LAB — BASIC METABOLIC PANEL
Anion gap: 16 — ABNORMAL HIGH (ref 5–15)
BUN: 14 mg/dL (ref 6–23)
CHLORIDE: 101 meq/L (ref 96–112)
CO2: 23 meq/L (ref 19–32)
CREATININE: 0.63 mg/dL (ref 0.50–1.10)
Calcium: 9.7 mg/dL (ref 8.4–10.5)
GFR calc Af Amer: >90 mL/min (ref >90)
GFR calc non Af Amer: >90 mL/min (ref >90)
Glucose, Bld: 128 mg/dL — ABNORMAL HIGH (ref 70–99)
Potassium: 4 mEq/L (ref 3.7–5.3)
SODIUM: 140 meq/L (ref 137–147)

## 2014-10-07 LAB — HEMOGLOBIN AND HEMATOCRIT, BLOOD
HCT: 41 % (ref 36.0–46.0)
Hemoglobin: 14.3 g/dL (ref 12.0–15.0)

## 2014-10-07 LAB — HCG, SERUM, QUALITATIVE: Preg, Serum: NEGATIVE

## 2014-10-07 NOTE — Patient Instructions (Signed)
Pete PeltMichelle M Belongia  10/07/2014   Your procedure is scheduled on:  10/12/2014  [facility] to [facility] at  845  AM.  Call this number if you have problems the morning of surgery: [phone]   Remember:   Do not eat food or drink liquids after midnight.   Take these medicines the morning of surgery with A SIP OF WATER:  Hydrocodone, robaxin, prilosec   Do not wear jewelry, make-up or nail polish.  Do not wear lotions, powders, or perfumes.   Do not shave 48 hours prior to surgery. Men may shave face and neck.  Do not bring valuables to the hospital.  [facility] is not responsible for any belongings or valuables.               Contacts, dentures or bridgework may not be worn into surgery.  Leave suitcase in the car. After surgery it may be brought to your room.  For patients admitted to the hospital, discharge time is determined by your treatment team.               Patients discharged the day of surgery will not be allowed to drive home.  Name and phone number of your driver:  family  Special Instructions: Shower using CHG 2 nights before surgery and the night before surgery.  If you shower the day of surgery use CHG.  Use special wash - you have one bottle of CHG for all showers.  You should use approximately 1/3 of the bottle for each shower.   Please read over the following fact sheets that you were given: Pain Booklet, Coughing and Deep Breathing, Surgical Site Infection Prevention, Anesthesia Post-op Instructions and Care and Recovery After Surgery Rotator Cuff Injury Rotator cuff injury is any type of injury to the set of muscles and tendons that make up the stabilizing unit of your shoulder. This unit holds the ball of your upper arm bone (humerus) in the socket of your shoulder blade (scapula).  CAUSES Injuries to your rotator cuff most commonly come from sports or activities that cause your arm to be moved repeatedly over your head. Examples of this include throwing,  weight lifting, swimming, or racquet sports. Long lasting (chronic) irritation of your rotator cuff can cause soreness and swelling (inflammation), bursitis, and eventual damage to your tendons, such as a tear (rupture). SIGNS AND SYMPTOMS Acute rotator cuff tear:  Sudden tearing sensation followed by severe pain shooting from your upper shoulder down your arm toward your elbow.  Decreased range of motion of your shoulder because of pain and muscle spasm.  Severe pain.  Inability to raise your arm out to the side because of pain and loss of muscle power (large tears). Chronic rotator cuff tear:  Pain that usually is worse at night and may interfere with sleep.  Gradual weakness and decreased shoulder motion as the pain worsens.  Decreased range of motion. Rotator cuff tendinitis:  Deep ache in your shoulder and the outside upper arm over your shoulder.  Pain that comes on gradually and becomes worse when lifting your arm to the side or turning it inward. DIAGNOSIS Rotator cuff injury is diagnosed through a medical history, physical exam, and imaging exam. The medical history helps determine the type of rotator cuff injury. Your health care provider will look at your injured shoulder, feel the injured area, and ask you to move your shoulder in different positions. X-ray exams typically are done to rule out  other causes of shoulder pain, such as fractures. MRI is the exam of choice for the most severe shoulder injuries because the images show muscles and tendons.  TREATMENT  Chronic tear:  Medicine for pain, such as acetaminophen or ibuprofen.  Physical therapy and range-of-motion exercises may be helpful in maintaining shoulder function and strength.  Steroid injections into your shoulder joint.  Surgical repair of the rotator cuff if the injury does not heal with noninvasive treatment. Acute tear:  Anti-inflammatory medicines such as ibuprofen and naproxen to help reduce pain  and swelling.  A sling to help support your arm and rest your rotator cuff muscles. Long-term use of a sling is not advised. It may cause significant stiffening of the shoulder joint.  Surgery may be considered within a few weeks, especially in younger, active people, to return the shoulder to full function.  Indications for surgical treatment include the following:  Age younger than 60 years.  Rotator cuff tears that are complete.  Physical therapy, rest, and anti-inflammatory medicines have been used for 6-8 weeks, with no improvement.  Employment or sporting activity that requires constant shoulder use. Tendinitis:  Anti-inflammatory medicines such as ibuprofen and naproxen to help reduce pain and swelling.  A sling to help support your arm and rest your rotator cuff muscles. Long-term use of a sling is not advised. It may cause significant stiffening of the shoulder joint.  Severe tendinitis may require:  Steroid injections into your shoulder joint.  Physical therapy.  Surgery. HOME CARE INSTRUCTIONS   Apply ice to your injury:  Put ice in a plastic bag.  Place a towel between your skin and the bag.  Leave the ice on for 20 minutes, 2-3 times a day.  If you have a shoulder immobilizer (sling and straps), wear it until told otherwise by your health care provider.  You may want to sleep on several pillows or in a recliner at night to lessen swelling and pain.  Only take over-the-counter or prescription medicines for pain, discomfort, or fever as directed by your health care provider.  Do simple hand squeezing exercises with a soft rubber ball to decrease hand swelling. SEEK MEDICAL CARE IF:   Your shoulder pain increases, or new pain or numbness develops in your arm, hand, or fingers.  Your hand or fingers are colder than your other hand. SEEK IMMEDIATE MEDICAL CARE IF:   Your arm, hand, or fingers are numb or tingling.  Your arm, hand, or fingers are  increasingly swollen and painful, or they turn white or blue. MAKE SURE YOU:  Understand these instructions.  Will watch your condition.  Will get help right away if you are not doing well or get worse. Document Released: 11/29/2000 Document Revised: 12/07/2013 Document Reviewed: 07/14/2013 [facility] Patient Information 2015 [employer] [place]. This information is not intended to replace advice given to you by your health care provider. Make sure you discuss any questions you have with your health care provider. PATIENT INSTRUCTIONS POST-ANESTHESIA  IMMEDIATELY FOLLOWING SURGERY:  Do not drive or operate machinery for the first twenty four hours after surgery.  Do not make any important decisions for twenty four hours after surgery or while taking narcotic pain medications or sedatives.  If you develop intractable nausea and vomiting or a severe headache please notify your doctor immediately.  FOLLOW-UP:  Please make an appointment with your surgeon as instructed. You do not need to follow up with anesthesia unless specifically instructed to do so.  WOUND CARE INSTRUCTIONS (  if applicable):  Keep a dry clean dressing on the anesthesia/puncture wound site if there is drainage.  Once the wound has quit draining you may leave it open to air.  Generally you should leave the bandage intact for twenty four hours unless there is drainage.  If the epidural site drains for more than 36-48 hours please call the anesthesia department.  QUESTIONS?:  Please feel free to call your physician or the hospital operator if you have any questions, and they will be happy to assist you.

## 2014-10-10 NOTE — Telephone Encounter (Signed)
Also see separate note entered - surgery has been scheduled for 10/12/14 at [facility], and has been pre-authorized by [facility] [employer], 3rd party contact for worker's comp, Auth# [id number], per written notice received in fax, 09/30/14.  Their ph# is [phone]; fax# [phone]. Patient aware.

## 2014-10-10 NOTE — H&P (Addendum)
Alexandria Mejia is an 43 y.o. female.   Chief Complaint: Right shoulder pain recurrent HPI: Original history and injury Chief Complaint   Patient presents with   .  Pain       Right arm and shoulder pain d/t right humerus  injury 01/31/2014    HISTORY: 43 year old female employed at the [employer] fell into an empty grade onto her right arm and shoulder complains of right shoulder and right forearm pain initial films were negative. Her date of injury is reported as Monday the 16th of 2015 in February. She complains of throbbing stabbing burning pain 7-9/10 pain which is constant associated with numbness tingling locking catching bruising and swelling  Medical history no allergies she's had surgery on her neck her gallbladder her appendix her knee she's currently taking hydrocodone and ibuprofen for her shoulder injury she has a family history of arthritis and diabetes  She is single and she works as a Chartered certified accountantgrave digger and maintenance she smokes daily does not drink her at school education was completed through the 12th grade  The patient had uneventful rotator cuff surgery and repair and was doing well until June of this year when she went to the emergency room with the following history per ER notes: "Alexandria Mejia is a 43 y.o. female who is currently 1 month out from right rotator cuff surgery presenting with increased pain and a sharp burning sensation in her right shoulder which radiates across her shoulder to her neck and into her mid right bicep which is worsened over the past 5 days.  She has been going to physical therapy and 5 days ago she felt and heard a loud popping sensation after which time the symptoms began.  She last had PT yesterday and believes the manipulation made her worse.  She is taking hydrocodone without relief of symptoms".  Following this episode her therapy was reduced and we treated her with rest gentle range of motion exercises and various medications and she did not  improve and we subsequently sent  for repeat MRI with contrast and the following findings are noted:  The official reading of her MRI is as follows longitudinal split tear distal subscapularis tendon at the insertion which extends to the articular surface at the superior portion there is also recurrent anterior insertional supraspinatus tendon tear involving the articular surface with probable small full-thickness perforation account for extravasation of contrast into the subacromial and subdeltoid space.          Based on the patient's clinical course of persistent pain weakness and failure to progress in rehabilitation and the MRI findings noted we have decided to do a revision rotator cuff repair including supraspinatus tendon repair.   Past Medical History  Diagnosis Date  . Back pain   . GERD (gastroesophageal reflux disease)   . Migraine   . Complication of anesthesia     patient states "with last surgery I was hard to wake up".  . Dysrhythmia     pt states "I have an irregular heart rate and it skips a beat".    Past Surgical History  Procedure Laterality Date  . Cholecystectomy    . Appendectomy    . Neck surgery      decompression of 7 disc  . Back surgery      lumbar-herniated disc  . Shoulder arthroscopy with rotator cuff repair Right 05/27/2014    Procedure: SHOULDER ARTHROSCOPY WITH ROTATOR CUFF REPAIR, subscalpularis repair, open supraspinatus repair;  Surgeon: Duffy RhodyStanley  Elita QuickE Harrison, MD;  Location: [facility];  Service: Orthopedics;  Laterality: Right;    Family History  Problem Relation Age of Onset  . COPD Mother   . Arthritis Mother   . Diabetes Father   . Heart disease Father   . Hypertension Brother   . Diabetes Maternal Grandmother   . Diabetes Paternal Grandmother   . Dementia Paternal Grandfather    Social History:  reports that she has been smoking Cigarettes.  She has a 7.5 pack-year smoking history. She has never used smokeless tobacco. She reports that  she does not drink alcohol or use illicit drugs.  Allergies:  Allergies  Allergen Reactions  . Codeine Itching  . Sulfa Antibiotics Itching and Swelling    No prescriptions prior to admission    No results found for this or any previous visit (from the past 48 hour(s)). No results found.  ROS comprehensive review of systems reveals the following: . Review of systems shortness of breath wheezing cough snoring heartburn swelling muscle pain all the systems were normal We also no persistent paresthesias in the upper extremity despite normal preoperative nerve conduction study.   There were no vitals taken for this visit. Physical Exam  Nursing note and vitals reviewed. Constitutional: She is oriented to person, place, and time. She appears well-developed and well-nourished.  HENT:  Head: Normocephalic.  Eyes: Pupils are equal, round, and reactive to light.  Neck: Normal range of motion.  Cardiovascular: Normal rate and intact distal pulses.   Respiratory: Effort normal.  Musculoskeletal:  Lower extremities are unremarkable left upper extremity unremarkable  Lymphadenopathy:       Right: No supraclavicular adenopathy present.       Left: No supraclavicular adenopathy present.  Neurological: She is alert and oriented to person, place, and time. She has normal reflexes. She displays normal reflexes. No cranial nerve deficit. She exhibits normal muscle tone. Coordination normal.  Skin: Skin is warm and dry. No rash noted. No erythema. No pallor.  Previous right shoulder surgical incision healed without complication no tenderness swelling or erythema  Psychiatric: She has a normal mood and affect. Her behavior is normal. Judgment and thought content normal.    Right shoulder evaluation a well-healed surgical incision is noted over the right shoulder. No tenderness erythema is noted in this area. There is tenderness in the anterior humeral area near the subscapularis biceps groove. She  has restricted range of motion but no instability weakness is noted in the rotator cuff in all planes internal/external rotation and attempts at forward elevation and abduction. Good distal pulses noted.  The patient's vital signs have been stable in the office. Assessment/Plan My interpretation of the MRIs that she has a re-tear of the right rotator cuff repair including the subscapularis portion along with the supraspinatus portion.  Recommend open revision cuff repair. Shoulder  Fuller CanadaStanley Harrison 10/10/2014, 3:01 PM

## 2014-10-12 ENCOUNTER — Ambulatory Visit (HOSPITAL_COMMUNITY)
Admission: RE | Admit: 2014-10-12 | Discharge: 2014-10-12 | Disposition: A | Discharge status: Home / Self Care | Payer: Worker's Compensation | Source: Ambulatory Visit | Attending: Orthopedic Surgery | Admitting: Orthopedic Surgery

## 2014-10-12 ENCOUNTER — Other Ambulatory Visit: Payer: Self-pay | Admitting: *Deleted

## 2014-10-12 ENCOUNTER — Ambulatory Visit (HOSPITAL_COMMUNITY): Payer: Worker's Compensation | Admitting: Anesthesiology

## 2014-10-12 ENCOUNTER — Telehealth: Payer: Self-pay | Admitting: Orthopedic Surgery

## 2014-10-12 ENCOUNTER — Encounter (HOSPITAL_COMMUNITY): Payer: Self-pay | Admitting: *Deleted

## 2014-10-12 ENCOUNTER — Encounter (HOSPITAL_COMMUNITY)
Admission: RE | Disposition: A | Discharge status: Home / Self Care | Payer: Self-pay | Source: Ambulatory Visit | Attending: Orthopedic Surgery

## 2014-10-12 ENCOUNTER — Encounter (HOSPITAL_COMMUNITY): Payer: Worker's Compensation | Admitting: Anesthesiology

## 2014-10-12 DIAGNOSIS — Z79899 Other long term (current) drug therapy: Secondary | ICD-10-CM | POA: Insufficient documentation

## 2014-10-12 DIAGNOSIS — F17210 Nicotine dependence, cigarettes, uncomplicated: Secondary | ICD-10-CM | POA: Insufficient documentation

## 2014-10-12 DIAGNOSIS — Y9389 Activity, other specified: Secondary | ICD-10-CM | POA: Insufficient documentation

## 2014-10-12 DIAGNOSIS — S46011A Strain of muscle(s) and tendon(s) of the rotator cuff of right shoulder, initial encounter: Secondary | ICD-10-CM | POA: Insufficient documentation

## 2014-10-12 DIAGNOSIS — W172XXA Fall into hole, initial encounter: Secondary | ICD-10-CM | POA: Diagnosis not present

## 2014-10-12 DIAGNOSIS — Y9289 Other specified places as the place of occurrence of the external cause: Secondary | ICD-10-CM | POA: Diagnosis not present

## 2014-10-12 DIAGNOSIS — S46211A Strain of muscle, fascia and tendon of other parts of biceps, right arm, initial encounter: Secondary | ICD-10-CM | POA: Insufficient documentation

## 2014-10-12 DIAGNOSIS — M75101 Unspecified rotator cuff tear or rupture of right shoulder, not specified as traumatic: Secondary | ICD-10-CM

## 2014-10-12 HISTORY — PX: SHOULDER OPEN ROTATOR CUFF REPAIR: SHX2407

## 2014-10-12 HISTORY — PX: FLEXOR TENOTOMY: SHX6342

## 2014-10-12 SURGERY — REPAIR, ROTATOR CUFF, OPEN
Anesthesia: General | Site: Shoulder | Laterality: Right

## 2014-10-12 MED ORDER — PROPOFOL 10 MG/ML IV BOLUS
INTRAVENOUS | Status: AC
  Filled 2014-10-12: qty 20

## 2014-10-12 MED ORDER — NEOSTIGMINE METHYLSULFATE 10 MG/10ML IV SOLN
INTRAVENOUS | Status: AC
  Filled 2014-10-12: qty 1

## 2014-10-12 MED ORDER — LACTATED RINGERS IV SOLN
INTRAVENOUS | Status: DC | PRN
  Administered 2014-10-12: 10:00:00 via INTRAVENOUS

## 2014-10-12 MED ORDER — EPHEDRINE SULFATE 50 MG/ML IJ SOLN
INTRAMUSCULAR | Status: AC
  Filled 2014-10-12: qty 1

## 2014-10-12 MED ORDER — FENTANYL CITRATE 0.05 MG/ML IJ SOLN
INTRAMUSCULAR | Status: AC
  Filled 2014-10-12: qty 5

## 2014-10-12 MED ORDER — OXYCODONE HCL 5 MG PO TABS
ORAL_TABLET | ORAL | Status: AC
  Filled 2014-10-12: qty 1

## 2014-10-12 MED ORDER — DEXAMETHASONE SODIUM PHOSPHATE 4 MG/ML IJ SOLN
INTRAMUSCULAR | Status: AC
  Filled 2014-10-12: qty 1

## 2014-10-12 MED ORDER — ROCURONIUM BROMIDE 50 MG/5ML IV SOLN
INTRAVENOUS | Status: AC
  Filled 2014-10-12: qty 1

## 2014-10-12 MED ORDER — CEFAZOLIN SODIUM-DEXTROSE 2-3 GM-% IV SOLR
INTRAVENOUS | Status: DC | PRN
  Administered 2014-10-12: 2 g via INTRAVENOUS

## 2014-10-12 MED ORDER — BUPIVACAINE-EPINEPHRINE (PF) 0.5% -1:200000 IJ SOLN
INTRAMUSCULAR | Status: AC
  Filled 2014-10-12: qty 30

## 2014-10-12 MED ORDER — ONDANSETRON HCL 4 MG/2ML IJ SOLN
INTRAMUSCULAR | Status: AC
  Filled 2014-10-12: qty 2

## 2014-10-12 MED ORDER — SUCCINYLCHOLINE CHLORIDE 20 MG/ML IJ SOLN
INTRAMUSCULAR | Status: AC
  Filled 2014-10-12: qty 1

## 2014-10-12 MED ORDER — CEFAZOLIN SODIUM-DEXTROSE 2-3 GM-% IV SOLR
2.0000 g | INTRAVENOUS | Status: DC
  Filled 2014-10-12: qty 50

## 2014-10-12 MED ORDER — PREGABALIN 50 MG PO CAPS
50.0000 mg | ORAL_CAPSULE | Freq: Once | ORAL | Status: AC
  Administered 2014-10-12: 50 mg via ORAL

## 2014-10-12 MED ORDER — BUPIVACAINE-EPINEPHRINE (PF) 0.5% -1:200000 IJ SOLN
INTRAMUSCULAR | Status: DC | PRN
  Administered 2014-10-12: 60 mL via PERINEURAL

## 2014-10-12 MED ORDER — GLYCOPYRROLATE 0.2 MG/ML IJ SOLN
INTRAMUSCULAR | Status: DC | PRN
  Administered 2014-10-12: 0.6 mg via INTRAVENOUS
  Administered 2014-10-12: 0.2 mg via INTRAVENOUS

## 2014-10-12 MED ORDER — ROCURONIUM BROMIDE 100 MG/10ML IV SOLN
INTRAVENOUS | Status: DC | PRN
  Administered 2014-10-12: 10 mg via INTRAVENOUS
  Administered 2014-10-12: 40 mg via INTRAVENOUS

## 2014-10-12 MED ORDER — FENTANYL CITRATE 0.05 MG/ML IJ SOLN
25.0000 ug | INTRAMUSCULAR | Status: DC | PRN
  Administered 2014-10-12 (×2): 50 ug via INTRAVENOUS
  Administered 2014-10-12: 25 ug via INTRAVENOUS
  Administered 2014-10-12: 50 ug via INTRAVENOUS
  Filled 2014-10-12: qty 2

## 2014-10-12 MED ORDER — ACETAMINOPHEN 10 MG/ML IV SOLN
INTRAVENOUS | Status: AC
  Filled 2014-10-12: qty 100

## 2014-10-12 MED ORDER — FENTANYL CITRATE 0.05 MG/ML IJ SOLN
INTRAMUSCULAR | Status: DC | PRN
  Administered 2014-10-12 (×2): 50 ug via INTRAVENOUS
  Administered 2014-10-12: 150 ug via INTRAVENOUS

## 2014-10-12 MED ORDER — NEOSTIGMINE METHYLSULFATE 10 MG/10ML IV SOLN
INTRAVENOUS | Status: DC | PRN
  Administered 2014-10-12: 4 mg via INTRAVENOUS

## 2014-10-12 MED ORDER — ATROPINE SULFATE 0.4 MG/ML IJ SOLN
INTRAMUSCULAR | Status: AC
  Filled 2014-10-12: qty 1

## 2014-10-12 MED ORDER — DEXAMETHASONE SODIUM PHOSPHATE 4 MG/ML IJ SOLN
4.0000 mg | Freq: Once | INTRAMUSCULAR | Status: AC
  Administered 2014-10-12: 4 mg via INTRAVENOUS

## 2014-10-12 MED ORDER — CHLORHEXIDINE GLUCONATE 4 % EX LIQD: 60.0000 mL | Freq: Once | CUTANEOUS | Status: DC

## 2014-10-12 MED ORDER — ONDANSETRON HCL 4 MG/2ML IJ SOLN
INTRAMUSCULAR | Status: DC | PRN
  Administered 2014-10-12: 4 mg via INTRAVENOUS

## 2014-10-12 MED ORDER — GLYCOPYRROLATE 0.2 MG/ML IJ SOLN
INTRAMUSCULAR | Status: AC
  Filled 2014-10-12: qty 3

## 2014-10-12 MED ORDER — BUPIVACAINE LIPOSOME 1.3 % IJ SUSP
INTRAMUSCULAR | Status: AC
  Filled 2014-10-12: qty 20

## 2014-10-12 MED ORDER — OXYCODONE-ACETAMINOPHEN 5-325 MG PO TABS: 1.0000 | ORAL_TABLET | ORAL | Status: DC | PRN

## 2014-10-12 MED ORDER — MIDAZOLAM HCL 2 MG/2ML IJ SOLN
INTRAMUSCULAR | Status: AC
  Filled 2014-10-12: qty 2

## 2014-10-12 MED ORDER — SODIUM CHLORIDE 0.9 % IR SOLN
Status: DC | PRN
  Administered 2014-10-12 (×2): 1000 mL

## 2014-10-12 MED ORDER — FENTANYL CITRATE 0.05 MG/ML IJ SOLN
INTRAMUSCULAR | Status: AC
  Filled 2014-10-12: qty 2

## 2014-10-12 MED ORDER — ONDANSETRON HCL 8 MG PO TABS: 4.0000 mg | ORAL_TABLET | Freq: Three times a day (TID) | ORAL | Status: DC | PRN

## 2014-10-12 MED ORDER — EPHEDRINE SULFATE 50 MG/ML IJ SOLN
INTRAMUSCULAR | Status: DC | PRN
  Administered 2014-10-12: 5 mg via INTRAVENOUS
  Administered 2014-10-12 (×2): 10 mg via INTRAVENOUS

## 2014-10-12 MED ORDER — PREGABALIN 50 MG PO CAPS
ORAL_CAPSULE | ORAL | Status: AC
  Filled 2014-10-12: qty 1

## 2014-10-12 MED ORDER — ONDANSETRON HCL 4 MG/2ML IJ SOLN: 4.0000 mg | Freq: Once | INTRAMUSCULAR | Status: DC | PRN

## 2014-10-12 MED ORDER — LACTATED RINGERS IV SOLN
INTRAVENOUS | Status: DC
  Administered 2014-10-12: 1000 mL via INTRAVENOUS

## 2014-10-12 MED ORDER — PROPOFOL 10 MG/ML IV BOLUS
INTRAVENOUS | Status: DC | PRN
  Administered 2014-10-12: 150 mg via INTRAVENOUS

## 2014-10-12 MED ORDER — METHOCARBAMOL 1000 MG/10ML IJ SOLN
500.0000 mg | Freq: Once | INTRAVENOUS | Status: AC
  Administered 2014-10-12: 500 mg via INTRAVENOUS
  Filled 2014-10-12: qty 5

## 2014-10-12 MED ORDER — ACETAMINOPHEN 10 MG/ML IV SOLN
1000.0000 mg | Freq: Once | INTRAVENOUS | Status: AC
  Administered 2014-10-12: 1000 mg via INTRAVENOUS

## 2014-10-12 MED ORDER — ATROPINE SULFATE 0.4 MG/ML IJ SOLN
INTRAMUSCULAR | Status: DC | PRN
  Administered 2014-10-12: 0.2 mg via INTRAVENOUS

## 2014-10-12 MED ORDER — OXYCODONE HCL 5 MG PO TABS
5.0000 mg | ORAL_TABLET | Freq: Once | ORAL | Status: AC
  Administered 2014-10-12: 5 mg via ORAL

## 2014-10-12 MED ORDER — LIDOCAINE HCL (PF) 1 % IJ SOLN
INTRAMUSCULAR | Status: AC
  Filled 2014-10-12: qty 5

## 2014-10-12 MED ORDER — MIDAZOLAM HCL 2 MG/2ML IJ SOLN
1.0000 mg | INTRAMUSCULAR | Status: DC | PRN
  Administered 2014-10-12: 2 mg via INTRAVENOUS

## 2014-10-12 MED ORDER — ONDANSETRON HCL 4 MG/2ML IJ SOLN: 4.0000 mg | Freq: Once | INTRAMUSCULAR | Status: DC

## 2014-10-12 MED ORDER — ONDANSETRON HCL 4 MG/2ML IJ SOLN
4.0000 mg | Freq: Once | INTRAMUSCULAR | Status: AC
  Administered 2014-10-12: 4 mg via INTRAVENOUS

## 2014-10-12 SURGICAL SUPPLY — 55 items
ANCHOR CORKSCREW 5.0 FIBERWIRE (Anchor) ×3 IMPLANT
BIT DRILL 2.0MX128MM (BIT) ×3 IMPLANT
BLADE HEX COATED 2.75 (ELECTRODE) ×3 IMPLANT
BLADE OSC/SAGITTAL MD 9X18.5 (BLADE) IMPLANT
BUR FAST CUTTING (BURR) ×2
BUR ROUND 5.0 (BURR) IMPLANT
BUR SRG 54X4.7X12 FLUT (BURR) ×1 IMPLANT
BURR SRG 54X4.7X12 FLUT (BURR) ×1
CHLORAPREP W/TINT 26ML (MISCELLANEOUS) ×3 IMPLANT
CLOTH BEACON ORANGE TIMEOUT ST (SAFETY) ×3 IMPLANT
CONNECTOR PERFECT PASSER (CONNECTOR) ×3 IMPLANT
COVER LIGHT HANDLE STERIS (MISCELLANEOUS) ×6 IMPLANT
COVER PROBE W GEL 5X96 (DRAPES) IMPLANT
DRAPE PROXIMA HALF (DRAPES) ×3 IMPLANT
DRAPE UTILITY W/TAPE 26X15 (DRAPES) ×6 IMPLANT
DRESSING ALLEVYN BORDER 5X5 (GAUZE/BANDAGES/DRESSINGS) ×3 IMPLANT
ELECT REM PT RETURN 9FT ADLT (ELECTROSURGICAL) ×3
ELECTRODE REM PT RTRN 9FT ADLT (ELECTROSURGICAL) ×1 IMPLANT
GLOVE BIOGEL M 7.0 STRL (GLOVE) ×6 IMPLANT
GLOVE BIOGEL PI IND STRL 7.0 (GLOVE) ×2 IMPLANT
GLOVE BIOGEL PI INDICATOR 7.0 (GLOVE) ×4
GLOVE EXAM NITRILE MD LF STRL (GLOVE) ×3 IMPLANT
GLOVE SKINSENSE NS SZ8.0 LF (GLOVE) ×2
GLOVE SKINSENSE STRL SZ8.0 LF (GLOVE) ×1 IMPLANT
GLOVE SS N UNI LF 8.5 STRL (GLOVE) ×3 IMPLANT
GOWN STRL REUS W/TWL LRG LVL3 (GOWN DISPOSABLE) ×6 IMPLANT
GOWN STRL REUS W/TWL XL LVL3 (GOWN DISPOSABLE) ×3 IMPLANT
IMMOBILIZER SHOULDER LGE (ORTHOPEDIC SUPPLIES) IMPLANT
IMMOBILIZER SHOULDER MED (ORTHOPEDIC SUPPLIES) IMPLANT
INST SET MINOR BONE (KITS) ×3 IMPLANT
KIT BLADEGUARD II DBL (SET/KITS/TRAYS/PACK) ×3 IMPLANT
KIT ROOM TURNOVER APOR (KITS) ×3 IMPLANT
MANIFOLD NEPTUNE II (INSTRUMENTS) ×3 IMPLANT
MARKER SKIN DUAL TIP RULER LAB (MISCELLANEOUS) ×3 IMPLANT
NEEDLE HYPO 21X1.5 SAFETY (NEEDLE) ×3 IMPLANT
NEEDLE MA TROC 1/2 (NEEDLE) IMPLANT
NEEDLE MAYO 6 CRC TAPER PT (NEEDLE) ×3 IMPLANT
NS IRRIG 1000ML POUR BTL (IV SOLUTION) ×3 IMPLANT
PACK TOTAL JOINT (CUSTOM PROCEDURE TRAY) ×3 IMPLANT
PAD ARMBOARD 7.5X6 YLW CONV (MISCELLANEOUS) ×3 IMPLANT
PASSER SUT CAPTURE FIRST (SUTURE) IMPLANT
PUSHLOCK PEEK 4.5X24 (Orthopedic Implant) ×3 IMPLANT
RASP SM TEAR CROSS CUT (RASP) IMPLANT
SET BASIN LINEN APH (SET/KITS/TRAYS/PACK) ×3 IMPLANT
STAPLER VISISTAT 35W (STAPLE) IMPLANT
SUT BONE WAX W31G (SUTURE) IMPLANT
SUT ETHIBOND NAB OS 4 #2 30IN (SUTURE) ×3 IMPLANT
SUT ETHILON 3 0 FSL (SUTURE) IMPLANT
SUT MON AB 0 CT1 (SUTURE) ×3 IMPLANT
SUT MON AB 2-0 CT1 36 (SUTURE) ×3 IMPLANT
SUT PROLENE 3 0 PS 1 (SUTURE) IMPLANT
SUT SMART STITCH CARTRIDGE (SUTURE) ×3 IMPLANT
SYR 30ML LL (SYRINGE) ×6 IMPLANT
SYR BULB IRRIGATION 50ML (SYRINGE) ×6 IMPLANT
YANKAUER SUCT 12FT TUBE ARGYLE (SUCTIONS) ×3 IMPLANT

## 2014-10-12 NOTE — Anesthesia Postprocedure Evaluation (Signed)
  Anesthesia Post-op Note  Patient: Alexandria PeltMichelle M Durocher  Procedure(s) Performed: Procedure(s): OPEN ROTATOR CUFF REPAIR SHOULDER (Right) BICEPS TENOTOMY (Right)  Patient Location: PACU  Anesthesia Type:General  Level of Consciousness: awake, alert , oriented, patient cooperative and responds to stimulation  Airway and Oxygen Therapy: Patient Spontanous Breathing and Patient connected to face mask oxygen  Post-op Pain: none  Post-op Assessment: Post-op Vital signs reviewed, Patient's Cardiovascular Status Stable, Respiratory Function Stable, Patent Airway, No signs of Nausea or vomiting and Pain level controlled  Post-op Vital Signs: Reviewed and stable  Last Vitals:  Filed Vitals:   10/12/14 1222  BP: 131/57  Pulse:   Temp: 36.9 C  Resp:     Complications: No apparent anesthesia complications

## 2014-10-12 NOTE — Discharge Instructions (Addendum)
Surgery for Rotator Cuff Tear with Rehab Rotator cuff surgery is only recommended for individuals who have experienced persistent disability for greater than 3 months of non-surgical (conservative) treatment. Surgery is not necessary but is recommended for individuals who experience difficulty completing daily activities or athletes who are unable to compete. Rotator cuff tears do not usually heal without surgical intervention. If left alone small rotator cuff tears usually become larger. Younger athletes who have a rotator cuff tear may be recommended for surgery without attempting conservative rehabilitation. The purpose of surgery is to regain function of the shoulder joint and eliminate pain associated with the injury. In addition to repairing the tendon tear, the surgery often removes a portion of the bony roof of the shoulder (acromion) as well as the chronically thickened and inflamed membrane below the acromion (subacromial bursa). REASONS NOT TO OPERATE   Infection of the shoulder.  Inability to complete a rehabilitation program.  Patients who have other conditions (emotional or psychological) conditions that contribute to their shoulder condition. RISKS AND COMPLICATIONS  Infection.  Re-tear of the rotator cuff tendons or muscles.  Shoulder stiffness and/or weakness.  Inability to compete in athletics.  Acromioclavicular (AC) joint paint.  Risks of surgery: infection, bleeding, nerve damage, or damage to surrounding tissues. TECHNIQUE There are different surgical procedures used to treat rotator cuff tears. The type of procedure depends on the extent of injury as well as the surgeon's preference. All of the surgical techniques for rotator cuff tears have the same goal of repairing the torn tendon, removing part of the acromion, and removing the subacromial bursa. There are two main types of procedures: arthroscopic and open incision. Arthroscopic procedures are usually completed  and you go home the same day as surgery (outpatient). These procedures use multiple small incisions in which tools and a video camera are placed to work on the shoulder. An electric shaver removes the bursa, then a power burr shaves down the portion of the acromion that places pressure on the rotator cuff. Finally the rotator cuff is sewed (sutured) back to the humeral head. Open incision procedures require a larger incision. The deltoid muscle is detached from the acromion and a ligament in the shoulder (coracoacromial) is cut in order for the surgeon to access the rotator cuff. The subacromial bursa is removed as well as part of the acromion to give the rotator cuff room to move freely. The torn tendon is then sutured to the humeral head. After the rotator cuff is repaired, then the deltoid is reattached and the incision is closed up.  RECOVERY   Post-operative care depends on the surgical technique and the preferences of your therapist.  Keep the wound clean and dry for the first 10 to 14 days after surgery.  Keep your shoulder and arm in the sling provided to you for as long as you have been instructed to.  You will be given pain medications by your caregiver.  Passive (without using muscles) shoulder movements may be begun immediately after surgery.  It is important to follow through with you rehabilitation program in order to have the best possible recovery. RETURN TO SPORTS   The rehabilitation period will depend on the sport and position you play as well as the success of the operation.  The minimum recovery period is 6 months.  You must have regained complete shoulder motion and strength before returning to sports. SEEK IMMEDIATE MEDICAL CARE IF:   Any medications produce adverse side effects.  Any complications from surgery   occur:  Pain, numbness, or coldness in the extremity operated upon.  Discoloration of the nail beds (they become blue or gray) of the extremity operated  upon.  Signs of infections (fever, pain, inflammation, redness, or persistent bleeding).  

## 2014-10-12 NOTE — Telephone Encounter (Signed)
[facility] and asked that we refill her Zofran for nausea after her surgery today, please advise?

## 2014-10-12 NOTE — Telephone Encounter (Signed)
REFILL SENT AND TOMMY AWARE

## 2014-10-12 NOTE — Op Note (Signed)
10/12/2014  12:19 PM  PATIENT:  Alexandria Mejia  43 y.o. female  PRE-OPERATIVE DIAGNOSIS:  right rotator cuff tear  POST-OPERATIVE DIAGNOSIS:  right rotator cuff tear, biceps tendon tear  PROCEDURE:  Procedure(s): OPEN ROTATOR CUFF REPAIR SHOULDER (Right) BICEPS TENOTOMY (Right)  Operative findings partial tear of the biceps tendon  recurrent tear of the rotator cuff supraspinatus at the anterior margin full-thickness tear supraspinatus 5 mm x 10 mm at the superior leading edge  tear at the rotator interval involving the subscapularis.  SURGEON:  Surgeon(s) and Role:    * Avon Molock E Bettejane Leavens, MD - Primary  PHYSICIAN ASSISTANT:   ASSISTANTS: betty ashley    ANESTHESIA:   general  EBL:  Total I/O In: 1500 [I.V.:1500] Out: -   BLOOD ADMINISTERED:none  DRAINS: none   LOCAL MEDICATIONS USED:  MARCAINE    and Amount: 60 ml  SPECIMEN:  No Specimen  DISPOSITION OF SPECIMEN:  N/A  COUNTS:  YES  TOURNIQUET:  * No tourniquets in log *  DICTATION: .Dragon Dictation Approved methods were used to identify the patient and then the right shoulder was marked as the surgical site. Chart update completed. Patient during the operating room. Appropriate antibiotics given. Patient placed in the supine position. Patient intubated. Patient placed in modified beachchair position.  After sterile prep and drape. Timeout was completed.  The previous incision was used and extended proximally and distally. Subcutaneous tissue was divided. Deltoid fascia was split in line with the  skin incision up to the level of the acromion. Immediate fluid from the joint was noted and was suctioned. The deltoid was not detached from the acromion. The biceps tendon was partially torn and a tenotomy was performed. Rotator cuff tear was found at the leading edge of the supraspinatus with ruptured sutures noted. There is also leading edge tear of the subscapularis. The tears were debrided.  The greater  tuberosity was prepared with drill holes and a bur to decorticate the bone. A 50 corkscrew anchor with 2 sutures were placed for medial row. These were tied and the sutures were saved and brought over the top of the tendon and a push lock anchor was used to place the 4 sutures. This gave an excellent watertight repair. The rotator interval and subscapularis tear were repaired with interrupted #2 Ethibond suture  The arm was taken through range of motion and found to have excellent movement without tension on the repair. The wound was irrigated the deltoid was closed with #2 Ethibond suture. Second irrigation was performed. The subdeltoid fascia was injected with Marcaine and epinephrine. The subcutaneous tissue was closed with 0 Monocryl and skin was reapproximated with staples. The patient was placed in a shoulder sling.  Extubation was performed and the patient was taken to the recovery room in stable condition.  PLAN OF CARE: Discharge to home after PACU  PATIENT DISPOSITION:  PACU - hemodynamically stable.   Delay start of Pharmacological VTE agent (>24hrs) due to surgical blood loss or risk of bleeding: not applicable  

## 2014-10-12 NOTE — Anesthesia Postprocedure Evaluation (Signed)
  Anesthesia Post-op Note  Patient: Alexandria Mejia  Procedure(s) Performed: Procedure(s): OPEN ROTATOR CUFF REPAIR SHOULDER (Right) BICEPS TENOTOMY (Right)  Patient Location: PACU  Anesthesia Type:General  Level of Consciousness: awake, alert , oriented, patient cooperative and responds to stimulation  Airway and Oxygen Therapy: Patient Spontanous Breathing and Patient connected to face mask oxygen  Post-op Pain: none  Post-op Assessment: Post-op Vital signs reviewed, Patient's Cardiovascular Status Stable, Respiratory Function Stable, Patent Airway, No signs of Nausea or vomiting, Adequate PO intake and Pain level controlled  Post-op Vital Signs: Reviewed and stable  Last Vitals:  Filed Vitals:   10/12/14 1222  BP: 131/57  Pulse:   Temp: 36.9 C  Resp:     Complications: No apparent anesthesia complications

## 2014-10-12 NOTE — Anesthesia Procedure Notes (Signed)
Procedure Name: Intubation Date/Time: 10/12/2014 10:17 AM Performed by: Patrcia DollyMOSES, Mazelle Huebert Pre-anesthesia Checklist: Patient identified, Patient being monitored, Timeout performed, Emergency Drugs available and Suction available Patient Re-evaluated:Patient Re-evaluated prior to inductionOxygen Delivery Method: Circle System Utilized Preoxygenation: Pre-oxygenation with 100% oxygen Intubation Type: IV induction Ventilation: Mask ventilation without difficulty Laryngoscope Size: Miller and 2 Grade View: Grade I Tube type: Oral Tube size: 7.0 mm Number of attempts: 1 Airway Equipment and Method: stylet Placement Confirmation: ETT inserted through vocal cords under direct vision,  positive ETCO2 and breath sounds checked- equal and bilateral Secured at: 21 cm Tube secured with: Tape Dental Injury: Teeth and Oropharynx as per pre-operative assessment

## 2014-10-12 NOTE — Anesthesia Preprocedure Evaluation (Signed)
Anesthesia Evaluation  Patient identified by MRN, date of birth, ID band Patient awake    Reviewed: Allergy & Precautions, H&P , NPO status , Patient's Chart, lab work & pertinent test results  History of Anesthesia Complications (+) PROLONGED EMERGENCE and history of anesthetic complications  Airway Mallampati: I  TM Distance: >3 FB     Dental  (+) Teeth Intact   Pulmonary Current Smoker,  breath sounds clear to auscultation        Cardiovascular + dysrhythmias Rhythm:Regular Rate:Normal     Neuro/Psych  Headaches,    GI/Hepatic GERD-  Controlled and Medicated,  Endo/Other    Renal/GU      Musculoskeletal   Abdominal   Peds  Hematology   Anesthesia Other Findings   Reproductive/Obstetrics                             Anesthesia Physical Anesthesia Plan  ASA: II  Anesthesia Plan: General   Post-op Pain Management:    Induction: Intravenous, Rapid sequence and Cricoid pressure planned  Airway Management Planned: Oral ETT  Additional Equipment:   Intra-op Plan:   Post-operative Plan: Extubation in OR  Informed Consent: I have reviewed the patients History and Physical, chart, labs and discussed the procedure including the risks, benefits and alternatives for the proposed anesthesia with the patient or authorized representative who has indicated his/her understanding and acceptance.     Plan Discussed with:   Anesthesia Plan Comments:         Anesthesia Quick Evaluation

## 2014-10-12 NOTE — Brief Op Note (Signed)
10/12/2014  12:19 PM  PATIENT:  Alexandria Mejia  43 y.o. female  PRE-OPERATIVE DIAGNOSIS:  right rotator cuff tear  POST-OPERATIVE DIAGNOSIS:  right rotator cuff tear, biceps tendon tear  PROCEDURE:  Procedure(s): OPEN ROTATOR CUFF REPAIR SHOULDER (Right) BICEPS TENOTOMY (Right)  Operative findings partial tear of the biceps tendon  recurrent tear of the rotator cuff supraspinatus at the anterior margin full-thickness tear supraspinatus 5 mm x 10 mm at the superior leading edge  tear at the rotator interval involving the subscapularis.  SURGEON:  Surgeon(s) and Role:    * Vickki HearingStanley E Simren Popson, MD - Primary  PHYSICIAN ASSISTANT:   ASSISTANTS: betty ashley    ANESTHESIA:   general  EBL:  Total I/O In: 1500 [I.V.:1500] Out: -   BLOOD ADMINISTERED:none  DRAINS: none   LOCAL MEDICATIONS USED:  MARCAINE    and Amount: 60 ml  SPECIMEN:  No Specimen  DISPOSITION OF SPECIMEN:  N/A  COUNTS:  YES  TOURNIQUET:  * No tourniquets in log *  DICTATION: .Dragon Dictation Approved methods were used to identify the patient and then the right shoulder was marked as the surgical site. Chart update completed. Patient during the operating room. Appropriate antibiotics given. Patient placed in the supine position. Patient intubated. Patient placed in modified beachchair position.  After sterile prep and drape. Timeout was completed.  The previous incision was used and extended proximally and distally. Subcutaneous tissue was divided. Deltoid fascia was split in line with the  skin incision up to the level of the acromion. Immediate fluid from the joint was noted and was suctioned. The deltoid was not detached from the acromion. The biceps tendon was partially torn and a tenotomy was performed. Rotator cuff tear was found at the leading edge of the supraspinatus with ruptured sutures noted. There is also leading edge tear of the subscapularis. The tears were debrided.  The greater  tuberosity was prepared with drill holes and a bur to decorticate the bone. A 50 corkscrew anchor with 2 sutures were placed for medial row. These were tied and the sutures were saved and brought over the top of the tendon and a push lock anchor was used to place the 4 sutures. This gave an excellent watertight repair. The rotator interval and subscapularis tear were repaired with interrupted #2 Ethibond suture  The arm was taken through range of motion and found to have excellent movement without tension on the repair. The wound was irrigated the deltoid was closed with #2 Ethibond suture. Second irrigation was performed. The subdeltoid fascia was injected with Marcaine and epinephrine. The subcutaneous tissue was closed with 0 Monocryl and skin was reapproximated with staples. The patient was placed in a shoulder sling.  Extubation was performed and the patient was taken to the recovery room in stable condition.  PLAN OF CARE: Discharge to home after PACU  PATIENT DISPOSITION:  PACU - hemodynamically stable.   Delay start of Pharmacological VTE agent (>24hrs) due to surgical blood loss or risk of bleeding: not applicable

## 2014-10-12 NOTE — Interval H&P Note (Signed)
History and Physical Interval Note:  10/12/2014 9:44 AM  Alexandria PeltMichelle M Mejia  has presented today for surgery, with the diagnosis of right rotator cuff tear  The various methods of treatment have been discussed with the patient and family. After consideration of risks, benefits and other options for treatment, the patient has consented to  Procedure(s): ROTATOR CUFF REPAIR SHOULDER OPEN (Right) as a surgical intervention .  The patient's history has been reviewed, patient examined, no change in status, stable for surgery.  I have reviewed the patient's chart and labs.  Questions were answered to the patient's satisfaction.     Fuller CanadaStanley September Mormile

## 2014-10-12 NOTE — Transfer of Care (Signed)
Immediate Anesthesia Transfer of Care Note  Patient: Alexandria PeltMichelle M Mejia  Procedure(s) Performed: Procedure(s): OPEN ROTATOR CUFF REPAIR SHOULDER (Right) BICEPS TENOTOMY (Right)  Patient Location: PACU  Anesthesia Type:General  Level of Consciousness: awake, alert , oriented, patient cooperative and responds to stimulation  Airway & Oxygen Therapy: Patient Spontanous Breathing and Patient connected to face mask oxygen  Post-op Assessment: Report given to PACU RN, Post -op Vital signs reviewed and stable and Patient moving all extremities  Post vital signs: Reviewed and stable  Complications: No apparent anesthesia complications

## 2014-10-13 ENCOUNTER — Encounter (HOSPITAL_COMMUNITY): Payer: Self-pay | Admitting: Orthopedic Surgery

## 2014-10-17 ENCOUNTER — Encounter: Payer: Self-pay | Admitting: Orthopedic Surgery

## 2014-10-17 ENCOUNTER — Ambulatory Visit (INDEPENDENT_AMBULATORY_CARE_PROVIDER_SITE_OTHER): Payer: Worker's Compensation | Admitting: Orthopedic Surgery

## 2014-10-17 VITALS — BP 117/88 | Ht 69.0 in | Wt 230.0 lb

## 2014-10-17 DIAGNOSIS — Z9889 Other specified postprocedural states: Secondary | ICD-10-CM

## 2014-10-17 MED ORDER — PROMETHAZINE HCL 25 MG PO TABS: 25.0000 mg | ORAL_TABLET | Freq: Four times a day (QID) | ORAL | Status: DC | PRN

## 2014-10-17 MED ORDER — HYDROCODONE-ACETAMINOPHEN 10-325 MG PO TABS: 1.0000 | ORAL_TABLET | ORAL | Status: DC | PRN

## 2014-10-17 NOTE — Progress Notes (Signed)
Patient ID: Alexandria PeltMichelle M Kochel, female   DOB: Sep 20, 1971, 43 y.o.   MRN: [id number]  Chief Complaint  Patient presents with  . Follow-up    Post op #1, right RCR right shoulder. DOS 10-12-14.   BP 117/88 mmHg  Ht 5\' 9"  (1.753 m)  Wt 230 lb (104.327 kg)  BMI 33.95 kg/m2  LMP 10/07/2014  Her incision looks good she says the Percocet is not working well however. She is also on Diprivan 800 and methocarbamol 500  Wound was redressed report her back on 10 mg of hydrocodone and Phenergan for nausea we recommended Merrill asked for constipation  Postop plan 3 weeks of no range of motion and then we can start Codman exercises for 3 weeks and then we can start active assisted and passive range of motion from weeks 7 through 12. At week 12 we can start progressive resistance exercises.

## 2014-10-20 ENCOUNTER — Telehealth: Payer: Self-pay | Admitting: Orthopedic Surgery

## 2014-10-20 NOTE — Telephone Encounter (Signed)
Notes, including dates of surgery/service 10/12/14 and 10/17/14, faxed to Workers comp insurer, [employer], fax# [phone]/copy to nurse case Geophysicist/field seismologistmanager Sherri McCormick.

## 2014-10-24 ENCOUNTER — Other Ambulatory Visit: Payer: Self-pay | Admitting: *Deleted

## 2014-10-24 ENCOUNTER — Encounter: Payer: Self-pay | Admitting: Orthopedic Surgery

## 2014-10-24 ENCOUNTER — Telehealth: Payer: Self-pay | Admitting: Orthopedic Surgery

## 2014-10-24 ENCOUNTER — Ambulatory Visit (INDEPENDENT_AMBULATORY_CARE_PROVIDER_SITE_OTHER): Payer: Worker's Compensation | Admitting: Orthopedic Surgery

## 2014-10-24 VITALS — BP 98/70 | Ht 69.0 in | Wt 230.0 lb

## 2014-10-24 DIAGNOSIS — Z9889 Other specified postprocedural states: Secondary | ICD-10-CM | POA: Insufficient documentation

## 2014-10-24 HISTORY — DX: Other specified postprocedural states: Z98.890

## 2014-10-24 MED ORDER — HYDROCODONE-ACETAMINOPHEN 10-325 MG PO TABS: 1.0000 | ORAL_TABLET | ORAL | Status: DC | PRN

## 2014-10-24 NOTE — Telephone Encounter (Signed)
Patient called from [facility] in [place] - states shey are needing our office to call regarding the filling of the prescription she received today at visit 10/24/14.  Please advise.

## 2014-10-24 NOTE — Telephone Encounter (Signed)
Spoke with pharmacist who states patient brought prescription in today for Norco 10 q4hrs, states same med with same instructions filled Nov 2. States patient states MD told her to take more often than directions. Discussed with dr Romeo Appleharrison who states this is not the case, med is to be taken as prescribed and not to fill until due. Pharmacist advised

## 2014-10-24 NOTE — Patient Instructions (Addendum)
Start therapy on the 30TH (workers comp)- [facility]

## 2014-10-24 NOTE — Progress Notes (Signed)
Patient ID: Alexandria Mejia, female   DOB: [birth year], 43 y.o.   MRN: [id number] Chief Complaint  Patient presents with  . Follow-up    POST OP 2, SUTURE REMOVAL, RIGHT RCR, DOS 10/12/14    This is the second visit from the revision repair of the right rotator cuff. The patient is postop day #12. We took her's tables out. Her wound looks good. She still has some burning in the back of the arm. The hand feels a little swollen.  She is on a muscle relaxer, she is also on hydrocodone for pain as well as ibuprofen.  She will start therapy on November 30 in follow-up with me postop week #6.

## 2014-10-28 ENCOUNTER — Telehealth: Payer: Self-pay | Admitting: Orthopedic Surgery

## 2014-10-28 NOTE — Telephone Encounter (Signed)
Notes, including date of service 10/24/14, and orders for physical therapy, faxed to Workers comp insurer, [employer], fax# [phone], copy to fax# [phone],attention to nurse case manager Ian MalkinSherri McCormick. *Update: 10/28/14: Re-faxed to Ian MalkinSherri McCormick, as she states she did not receive the fax. She has given verbal approval for physical therapy at: (same as patient's previous location): [facility] [place].  Called patient, left message to notify that she may call to schedule, however, again, as of date 11/14/14 for therapy start date.

## 2014-10-28 NOTE — Telephone Encounter (Signed)
OPENED IN ERROR

## 2014-11-03 ENCOUNTER — Ambulatory Visit (INDEPENDENT_AMBULATORY_CARE_PROVIDER_SITE_OTHER): Payer: Worker's Compensation | Admitting: Orthopedic Surgery

## 2014-11-03 DIAGNOSIS — Z9889 Other specified postprocedural states: Secondary | ICD-10-CM

## 2014-11-03 NOTE — Progress Notes (Signed)
Patient ID: [name], female   DOB: [birth year], 43 y.o.   MRN: [id number]  the patient thought she felt the staple staples were removed several days back  She has a 0 Monocryl stitch which I trimmed. I couldn't get all of it. Should absorb. Keep regular appointment.

## 2014-11-08 ENCOUNTER — Telehealth: Payer: Self-pay | Admitting: *Deleted

## 2014-11-08 NOTE — Telephone Encounter (Signed)
Give a list of primary care doc in [place] include health dept

## 2014-11-08 NOTE — Telephone Encounter (Signed)
Patient called crying states she is very depressed, she has never felt like this, she is depressed from not being able to work etc for almost a year, asked if she has a primary care Dr that she could see about this and she is asking if workmans comp will cover, advised i was not sure but would check with Dr Romeo AppleHarrison and Okey Regalarol to see

## 2014-11-09 NOTE — Telephone Encounter (Signed)
Patient aware.

## 2014-11-14 ENCOUNTER — Other Ambulatory Visit: Payer: Self-pay | Admitting: *Deleted

## 2014-11-14 ENCOUNTER — Telehealth: Payer: Self-pay | Admitting: Orthopedic Surgery

## 2014-11-14 MED ORDER — HYDROCODONE-ACETAMINOPHEN 10-325 MG PO TABS: 1.0000 | ORAL_TABLET | ORAL | Status: DC | PRN

## 2014-11-14 NOTE — Telephone Encounter (Signed)
Patient is calling asking for a refill on pain medication HYDROcodone-acetaminophen (NORCO) 10-325 MG per tablet please advise?  

## 2014-11-15 NOTE — Telephone Encounter (Signed)
Patient picked up Rx

## 2014-11-15 NOTE — Telephone Encounter (Signed)
Rx is available for pick up, patient is aware 

## 2014-11-28 ENCOUNTER — Telehealth: Payer: Self-pay | Admitting: Orthopedic Surgery

## 2014-11-28 ENCOUNTER — Other Ambulatory Visit: Payer: Self-pay | Admitting: *Deleted

## 2014-11-28 MED ORDER — HYDROCODONE-ACETAMINOPHEN 10-325 MG PO TABS: 1.0000 | ORAL_TABLET | ORAL | Status: DC | PRN

## 2014-11-28 NOTE — Telephone Encounter (Signed)
Patient picked up Rx

## 2014-11-28 NOTE — Telephone Encounter (Signed)
Patient is asking for a refill on pain medication HYDROcodone-acetaminophen (NORCO) 10-325 MG per tablet, please advise?

## 2014-11-28 NOTE — Telephone Encounter (Signed)
Prescription available, patient aware  

## 2014-12-01 ENCOUNTER — Encounter: Payer: Self-pay | Admitting: Orthopedic Surgery

## 2014-12-01 ENCOUNTER — Ambulatory Visit (INDEPENDENT_AMBULATORY_CARE_PROVIDER_SITE_OTHER): Payer: Worker's Compensation | Admitting: Orthopedic Surgery

## 2014-12-01 VITALS — BP 126/74 | Ht 69.0 in | Wt 230.0 lb

## 2014-12-01 DIAGNOSIS — Z9889 Other specified postprocedural states: Secondary | ICD-10-CM

## 2014-12-01 DIAGNOSIS — M792 Neuralgia and neuritis, unspecified: Secondary | ICD-10-CM

## 2014-12-01 MED ORDER — PROMETHAZINE HCL 25 MG PO TABS: 25.0000 mg | ORAL_TABLET | Freq: Four times a day (QID) | ORAL | Status: DC | PRN

## 2014-12-01 NOTE — Progress Notes (Signed)
Patient ID: Alexandria PeltMichelle M Mejia, female   DOB: 1971/04/02, 43 y.o.   MRN: [id number] Chief Complaint  Patient presents with  . Follow-up    follow up right shoulder, rcr, dos 10/12/14   BP 126/74 mmHg  Ht 5\' 9"  (1.753 m)  Wt 230 lb (104.327 kg)  BMI 33.95 kg/m2  Status post revision rotator cuff repair October 28.  She is in therapy she is doing well she still having a burning pain in her biceps area which is bothering her quite a bit.  Her incision looks good except for one little not of the suture that hasn't resolved I tried to get about I could not we covered it with alcohol and a Band-Aid.  We can advance therapy once I talk to the therapist she will come back in about 6 weeks or so she should continue her out of work as complete with a return to light duty late January

## 2014-12-01 NOTE — Patient Instructions (Addendum)
WORK- LIGHT DUTY ( 1 ARM )STARTING 01/12/15

## 2014-12-02 ENCOUNTER — Telehealth: Payer: Self-pay | Admitting: Orthopedic Surgery

## 2014-12-02 NOTE — Telephone Encounter (Signed)
Notes faxed to Workers comp insurer, [employer], fax# [phone], copy to nurse case manager, who accompanied patient at this visit, date of service 12/01/14, Alexandria Mejia, fax# [phone] (her direct ph# [phone]).

## 2014-12-05 ENCOUNTER — Telehealth: Payer: Self-pay | Admitting: Orthopedic Surgery

## 2014-12-05 NOTE — Telephone Encounter (Signed)
Received call back from [facility], [place], physical therapist, states returning your call. Please call back at ph# [phone].

## 2014-12-06 ENCOUNTER — Telehealth: Payer: Self-pay | Admitting: Orthopedic Surgery

## 2014-12-06 ENCOUNTER — Other Ambulatory Visit: Payer: Self-pay | Admitting: *Deleted

## 2014-12-06 MED ORDER — HYDROCODONE-ACETAMINOPHEN 10-325 MG PO TABS: 1.0000 | ORAL_TABLET | ORAL | Status: DC | PRN

## 2014-12-06 NOTE — Telephone Encounter (Signed)
Alexandria Mejia is calling asking for a refill on her pain medication before we leave for the Holiday HYDROcodone-acetaminophen (NORCO) 10-325 MG per tablet [[id number]][[id number]] please advise?

## 2014-12-07 NOTE — Telephone Encounter (Signed)
Prescription available, patient aware  

## 2014-12-07 NOTE — Telephone Encounter (Signed)
Patient picked up Rx

## 2014-12-27 ENCOUNTER — Telehealth: Payer: Self-pay | Admitting: Orthopedic Surgery

## 2014-12-27 ENCOUNTER — Other Ambulatory Visit: Payer: Self-pay | Admitting: *Deleted

## 2014-12-27 MED ORDER — HYDROCODONE-ACETAMINOPHEN 10-325 MG PO TABS: 1.0000 | ORAL_TABLET | ORAL | Status: DC | PRN

## 2014-12-27 NOTE — Telephone Encounter (Signed)
Patient requests refill of pain medication; she also relays that she has not been notified of whether her additional physical therapy visits at [facility]. We have contacted her Workers Emergency planning/management officercomp insurer to further advise.  Patient's ph# to call regarding refill: [phone]

## 2014-12-28 NOTE — Telephone Encounter (Signed)
Reached patient; aware, prescription pick up today, 12/28/14.

## 2014-12-28 NOTE — Telephone Encounter (Signed)
Patient relays, upon pick up of prescription, that she has been out of sling 12/16/14, gradually as advised, and said she still has burning sensation, as Dr Romeo AppleHarrison had been aware; therefore, she is wearing it off and on as needed.  If any other recommendations, please advise.

## 2014-12-28 NOTE — Telephone Encounter (Signed)
Prescription available, called patient, no answer 

## 2014-12-28 NOTE — Telephone Encounter (Signed)
Routing to Dr Harrison 

## 2015-01-10 ENCOUNTER — Encounter: Payer: Self-pay | Admitting: Orthopedic Surgery

## 2015-01-10 ENCOUNTER — Ambulatory Visit (INDEPENDENT_AMBULATORY_CARE_PROVIDER_SITE_OTHER): Payer: Worker's Compensation | Admitting: Orthopedic Surgery

## 2015-01-10 VITALS — BP 127/88 | Ht 69.0 in | Wt 239.0 lb

## 2015-01-10 DIAGNOSIS — Z9889 Other specified postprocedural states: Secondary | ICD-10-CM

## 2015-01-10 DIAGNOSIS — M792 Neuralgia and neuritis, unspecified: Secondary | ICD-10-CM

## 2015-01-10 MED ORDER — HYDROCODONE-ACETAMINOPHEN 10-325 MG PO TABS: 1.0000 | ORAL_TABLET | ORAL | Status: DC | PRN

## 2015-01-10 NOTE — Progress Notes (Signed)
Chief Complaint  Patient presents with  . Follow-up    6 week follow up Right shoulder, RCR, DOS 10/12/14// 3 MOS SINCE REVISION SURGERY     Assessment is rotator cuff surgery in October she's missed last 2 weeks of therapy again some problems with the orders not sure what's going on with that and she also has some transportation issues where she can only get to therapy twice week  She complains of lateral deltoid pain numbness and tingling in the small and ring finger and pain in the biceps  I've ordered a topical cream for her to address the neuropathic pain she started taken her muscle relaxer ibuprofen and her 10 mg hydrocodone. Her progress is very very slow and concerning.  Continue therapy come back in 4 weeks

## 2015-01-11 ENCOUNTER — Telehealth: Payer: Self-pay | Admitting: Orthopedic Surgery

## 2015-01-11 NOTE — Telephone Encounter (Signed)
Notes faxed to Workers comp insurer, [employer], fax# [phone], for date of service 01/10/15.

## 2015-01-18 ENCOUNTER — Telehealth: Payer: Self-pay | Admitting: Orthopedic Surgery

## 2015-01-18 ENCOUNTER — Other Ambulatory Visit: Payer: Self-pay | Admitting: *Deleted

## 2015-01-18 MED ORDER — HYDROCODONE-ACETAMINOPHEN 10-325 MG PO TABS: 1.0000 | ORAL_TABLET | ORAL | Status: DC | PRN

## 2015-01-18 NOTE — Telephone Encounter (Signed)
Patient is calling stating that the physical therapist stated today that her shoulder has some swelling and was warm to the touch, Alexandria Mejia is also requesting a refill on her pain medication HYDROcodone-acetaminophen (NORCO) 10-325 MG per tablet [[id number]][[id number]] please advise?

## 2015-01-18 NOTE — Telephone Encounter (Signed)
Routing to Dr Harrison 

## 2015-01-19 ENCOUNTER — Encounter: Payer: Self-pay | Admitting: Orthopedic Surgery

## 2015-01-20 NOTE — Telephone Encounter (Signed)
Patient picked up Rx

## 2015-02-05 ENCOUNTER — Emergency Department (HOSPITAL_COMMUNITY)
Admission: EM | Admit: 2015-02-05 | Discharge: 2015-02-05 | Disposition: A | Discharge status: Home / Self Care | Payer: Self-pay | Attending: Emergency Medicine | Admitting: Emergency Medicine

## 2015-02-05 ENCOUNTER — Emergency Department (HOSPITAL_COMMUNITY): Payer: Self-pay

## 2015-02-05 ENCOUNTER — Encounter (HOSPITAL_COMMUNITY): Payer: Self-pay | Admitting: Emergency Medicine

## 2015-02-05 DIAGNOSIS — Z8679 Personal history of other diseases of the circulatory system: Secondary | ICD-10-CM | POA: Insufficient documentation

## 2015-02-05 DIAGNOSIS — R0789 Other chest pain: Secondary | ICD-10-CM | POA: Insufficient documentation

## 2015-02-05 DIAGNOSIS — Z79899 Other long term (current) drug therapy: Secondary | ICD-10-CM | POA: Insufficient documentation

## 2015-02-05 DIAGNOSIS — R079 Chest pain, unspecified: Secondary | ICD-10-CM

## 2015-02-05 DIAGNOSIS — Z720 Tobacco use: Secondary | ICD-10-CM | POA: Insufficient documentation

## 2015-02-05 DIAGNOSIS — K219 Gastro-esophageal reflux disease without esophagitis: Secondary | ICD-10-CM | POA: Insufficient documentation

## 2015-02-05 LAB — BASIC METABOLIC PANEL
ANION GAP: 7 (ref 5–15)
BUN: 12 mg/dL (ref 6–23)
CO2: 26 mmol/L (ref 19–32)
Calcium: 9.4 mg/dL (ref 8.4–10.5)
Chloride: 105 mmol/L (ref 96–112)
Creatinine, Ser: 0.62 mg/dL (ref 0.50–1.10)
GLUCOSE: 226 mg/dL — AB (ref 70–99)
POTASSIUM: 3.9 mmol/L (ref 3.5–5.1)
SODIUM: 138 mmol/L (ref 135–145)

## 2015-02-05 LAB — CBC WITH DIFFERENTIAL/PLATELET
Basophils Absolute: 0 10*3/uL (ref 0.0–0.1)
Basophils Relative: 0 % (ref 0–1)
EOS PCT: 3 % (ref 0–5)
Eosinophils Absolute: 0.2 10*3/uL (ref 0.0–0.7)
HCT: 40.6 % (ref 36.0–46.0)
HEMOGLOBIN: 13.9 g/dL (ref 12.0–15.0)
LYMPHS ABS: 2.6 10*3/uL (ref 0.7–4.0)
LYMPHS PCT: 36 % (ref 12–46)
MCH: 31.6 pg (ref 26.0–34.0)
MCHC: 34.2 g/dL (ref 30.0–36.0)
MCV: 92.3 fL (ref 78.0–100.0)
MONO ABS: 0.5 10*3/uL (ref 0.1–1.0)
Monocytes Relative: 7 % (ref 3–12)
NEUTROS ABS: 3.9 10*3/uL (ref 1.7–7.7)
NEUTROS PCT: 54 % (ref 43–77)
Platelets: 236 10*3/uL (ref 150–400)
RBC: 4.4 MIL/uL (ref 3.87–5.11)
RDW: 12.6 % (ref 11.5–15.5)
WBC: 7.2 10*3/uL (ref 4.0–10.5)

## 2015-02-05 LAB — TROPONIN I: Troponin I: <0.03 ng/mL (ref <0.031)

## 2015-02-05 MED ORDER — HYDROMORPHONE HCL 1 MG/ML IJ SOLN
1.0000 mg | Freq: Once | INTRAMUSCULAR | Status: AC
  Administered 2015-02-05: 1 mg via INTRAVENOUS
  Filled 2015-02-05: qty 1

## 2015-02-05 MED ORDER — DICLOFENAC SODIUM 25 MG PO TBEC: 25.0000 mg | DELAYED_RELEASE_TABLET | Freq: Three times a day (TID) | ORAL | Status: DC | PRN

## 2015-02-05 MED ORDER — IOHEXOL 350 MG/ML SOLN
100.0000 mL | Freq: Once | INTRAVENOUS | Status: AC | PRN
  Administered 2015-02-05: 100 mL via INTRAVENOUS

## 2015-02-05 MED ORDER — ONDANSETRON HCL 4 MG/2ML IJ SOLN
4.0000 mg | Freq: Once | INTRAMUSCULAR | Status: AC
  Administered 2015-02-05: 4 mg via INTRAVENOUS
  Filled 2015-02-05: qty 2

## 2015-02-05 NOTE — ED Notes (Signed)
MD at bedside. 

## 2015-02-05 NOTE — ED Notes (Signed)
SOB increased for last couple days.  Stabbing pain to right chest.  Rates pain 7.  No medications taken today.

## 2015-02-05 NOTE — Discharge Instructions (Signed)
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
## 2015-02-07 ENCOUNTER — Encounter: Payer: Self-pay | Admitting: Orthopedic Surgery

## 2015-02-07 ENCOUNTER — Telehealth: Payer: Self-pay | Admitting: Orthopedic Surgery

## 2015-02-07 ENCOUNTER — Ambulatory Visit (INDEPENDENT_AMBULATORY_CARE_PROVIDER_SITE_OTHER): Payer: Worker's Compensation | Admitting: Orthopedic Surgery

## 2015-02-07 VITALS — BP 112/82 | Ht 69.0 in | Wt 230.0 lb

## 2015-02-07 DIAGNOSIS — M792 Neuralgia and neuritis, unspecified: Secondary | ICD-10-CM

## 2015-02-07 DIAGNOSIS — M75101 Unspecified rotator cuff tear or rupture of right shoulder, not specified as traumatic: Secondary | ICD-10-CM

## 2015-02-07 DIAGNOSIS — Z9889 Other specified postprocedural states: Secondary | ICD-10-CM

## 2015-02-07 MED ORDER — HYDROCODONE-ACETAMINOPHEN 10-325 MG PO TABS: 1.0000 | ORAL_TABLET | ORAL | Status: DC | PRN

## 2015-02-07 MED ORDER — IBUPROFEN 800 MG PO TABS: 800.0000 mg | ORAL_TABLET | Freq: Three times a day (TID) | ORAL | Status: DC

## 2015-02-07 NOTE — Telephone Encounter (Signed)
Notes faxed to Workers comp insurer, [employer], fax# [phone], for date of service 02/07/15.
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
## 2015-02-07 NOTE — Patient Instructions (Signed)
WILL REFER TO DR Lovell SheehanJENKINS FOR RIGHT ABDOMINAL MASS

## 2015-02-09 ENCOUNTER — Telehealth: Payer: Self-pay | Admitting: Orthopedic Surgery

## 2015-02-09 NOTE — Telephone Encounter (Signed)
Call received from Workers comp nurse case Production designer, theatre/television/filmmanager, Ian MalkinSherri McCormick, a 2nd opinion appointment has been scheduled for 02/21/15, 8:15a.m, with Dr [place]ia [place]. Patient is aware.

## 2015-02-10 ENCOUNTER — Other Ambulatory Visit: Payer: Self-pay | Admitting: *Deleted

## 2015-02-10 ENCOUNTER — Telehealth: Payer: Self-pay | Admitting: *Deleted

## 2015-02-10 DIAGNOSIS — R1903 Right lower quadrant abdominal swelling, mass and lump: Secondary | ICD-10-CM

## 2015-02-10 NOTE — Telephone Encounter (Signed)
REFERRAL FAXED TO DR JENKINS 

## 2015-02-13 ENCOUNTER — Telehealth: Payer: Self-pay | Admitting: Orthopedic Surgery

## 2015-02-13 NOTE — Telephone Encounter (Signed)
Routing to DR HARRISON 

## 2015-02-13 NOTE — Telephone Encounter (Signed)
Patient was in Therapy Thursday and she was doing her Therapy and during that time her shoulder popped and she has not been able to move it since. She has iced and took pain medication but nothing is seeming to relieve her pain. She is not able to mover her arm much at all, her appointment with Dr. Ophelia CharterYates is not until 03/16 please advise as soon as possible.

## 2015-02-13 NOTE — Telephone Encounter (Signed)
Called patient, no answer, do you want to bring her into office? Or wait until she sees Dr Ophelia CharterYates on 3/16?

## 2015-02-13 NOTE — Telephone Encounter (Signed)
Stop all exercise place in sling and do not move the arm   Stop therapy           

## 2015-02-14 NOTE — Telephone Encounter (Signed)
Stormy CardJaime, Kimla has returned your call

## 2015-02-14 NOTE — Telephone Encounter (Signed)
Patient aware.

## 2015-02-14 NOTE — Telephone Encounter (Signed)
Stop all exercise place in sling and do not move the arm   Stop therapy

## 2015-02-16 ENCOUNTER — Ambulatory Visit (INDEPENDENT_AMBULATORY_CARE_PROVIDER_SITE_OTHER): Payer: Worker's Compensation | Admitting: Orthopedic Surgery

## 2015-02-16 ENCOUNTER — Encounter: Payer: Self-pay | Admitting: Orthopedic Surgery

## 2015-02-16 DIAGNOSIS — Z9889 Other specified postprocedural states: Secondary | ICD-10-CM

## 2015-02-16 NOTE — Progress Notes (Signed)
Chief Complaint  Patient presents with  . Shoulder Problem    Right shoulder pain after therapy   The patient presents after going to therapy and performing some doorjamb exercises she completed them leaned forward into the pendulum position heard a lot of loud pop in her arm and had severe pain increased over 24 hours and she was unable to move her arm  At this point we are unsure what happened. I would like her to rest arm for another week and then let me reexamine at this point no reasonable cause for her recent dilemma  Repeat shoulder cuff repair on 10/12/2014

## 2015-02-16 NOTE — Patient Instructions (Signed)
No exercises  Continue immobilizer

## 2015-02-20 ENCOUNTER — Telehealth: Payer: Self-pay | Admitting: Orthopedic Surgery

## 2015-02-20 NOTE — Telephone Encounter (Signed)
ROI/Release of Information request - Notes faxed to Workers comp insurer, [employer], fax# [phone], for date of service 02/16/15.

## 2015-02-21 ENCOUNTER — Telehealth: Payer: Self-pay | Admitting: Orthopedic Surgery

## 2015-02-21 ENCOUNTER — Other Ambulatory Visit: Payer: Self-pay | Admitting: *Deleted

## 2015-02-21 MED ORDER — HYDROCODONE-ACETAMINOPHEN 10-325 MG PO TABS: 1.0000 | ORAL_TABLET | ORAL | Status: DC | PRN

## 2015-02-21 NOTE — Telephone Encounter (Signed)
Patient called requesting refill on medication: HYDROcodone-acetaminophen (NORCO) 10-325 MG per tablet [[id number]][[id number]] - her next scheduled appointment is Thursday, 02/23/15. Ph# [phone]

## 2015-02-22 NOTE — Telephone Encounter (Signed)
Appt with Dr Lovell SheehanJenkins 03/09/15 11:30am, self pay ov $100  Patient aware

## 2015-02-22 NOTE — Telephone Encounter (Signed)
Prescription available, patient aware  

## 2015-02-23 ENCOUNTER — Ambulatory Visit (INDEPENDENT_AMBULATORY_CARE_PROVIDER_SITE_OTHER): Payer: Worker's Compensation | Admitting: Orthopedic Surgery

## 2015-02-23 ENCOUNTER — Encounter: Payer: Self-pay | Admitting: Orthopedic Surgery

## 2015-02-23 VITALS — BP 114/84 | Ht 69.0 in | Wt 230.0 lb

## 2015-02-23 DIAGNOSIS — Z9889 Other specified postprocedural states: Secondary | ICD-10-CM | POA: Diagnosis not present

## 2015-02-23 MED ORDER — METHOCARBAMOL 750 MG PO TABS: 750.0000 mg | ORAL_TABLET | Freq: Four times a day (QID) | ORAL | Status: DC

## 2015-02-23 NOTE — Progress Notes (Signed)
Follow-up visit patient had pain after therapy last week we rested her she is a little bit better she still having a significant amount of burning in the right shoulder is neck pain as well. The small and ring finger of the right hand are numb. She has tenderness and swelling over the right shoulder. I did range her arm in abduction external rotation extension she did not tolerate flexion very well.  Parameters for therapy to be restarted have been sent to the therapist  I'll see her in 2 weeks  I increased her Robaxin to 750 mg

## 2015-02-23 NOTE — Patient Instructions (Signed)
Extention 0-30 Abduction 0-90 External rotation 0-30 No forward elevation Add modalities as needed

## 2015-03-07 ENCOUNTER — Telehealth: Payer: Self-pay | Admitting: Orthopedic Surgery

## 2015-03-07 ENCOUNTER — Ambulatory Visit (INDEPENDENT_AMBULATORY_CARE_PROVIDER_SITE_OTHER): Payer: Worker's Compensation | Admitting: Orthopedic Surgery

## 2015-03-07 ENCOUNTER — Encounter: Payer: Self-pay | Admitting: Orthopedic Surgery

## 2015-03-07 VITALS — BP 131/94 | Ht 69.0 in | Wt 230.0 lb

## 2015-03-07 DIAGNOSIS — Z9889 Other specified postprocedural states: Secondary | ICD-10-CM

## 2015-03-07 DIAGNOSIS — M792 Neuralgia and neuritis, unspecified: Secondary | ICD-10-CM | POA: Diagnosis not present

## 2015-03-07 MED ORDER — HYDROCODONE-ACETAMINOPHEN 10-325 MG PO TABS: 1.0000 | ORAL_TABLET | ORAL | Status: DC | PRN

## 2015-03-07 NOTE — Telephone Encounter (Signed)
Patient stopped back in office after today's appointment as she needed to ask for medication refill, due to office being closed after Thursday, for holiday.  Medication: HYDROcodone-acetaminophen (NORCO) 10-325 MG per tablet [[id number]][[id number]]

## 2015-03-07 NOTE — Patient Instructions (Signed)
Recommend MRI Rt shoulder w/ joint contrast

## 2015-03-07 NOTE — Progress Notes (Signed)
The patient has had her second opinion I agree with the opinion that the patient's prognosis is very guarded at this point  I also agree that she should not go to therapy and do a self-directed program I will start with Codman exercises prior to getting her involved with pulley exercises  It is unclear at this point whether she has a new tear or new injury to the shoulder that would require any other intervention other than guarded therapy medication and continued monitoring  My recommendation is for a new MRI with contrast to see if there is any other injury  I am also concerned and continued to worry that she has some type of neuritis as her symptoms are of burning and tingling and she is hypersensitive near her incision  Follow-up after MRI arthrogram and follow-up 2 weeks for medication refill

## 2015-03-07 NOTE — Telephone Encounter (Signed)
Notes faxed to Workers comp insurer, [employer], fax# [phone], for dates of service 02/23/15 and 03/07/15; including request for authorization of MRI of Right shoulder w/contrast; to attention of Ian MalkinSherri McCormick, fax#above,ph# [phone], patient aware.

## 2015-03-09 NOTE — Telephone Encounter (Signed)
Received call from Workers comp nurse case Geophysicist/field seismologistmanager Sherri McCormick with verbal approval for MRI with joint contrast,right shoulder, per reference to Claim# on file.  Their 3rd party contact, [facility] will forward appointment and location information; left message for patient to notify of status.

## 2015-03-14 ENCOUNTER — Other Ambulatory Visit: Payer: Self-pay | Admitting: *Deleted

## 2015-03-14 DIAGNOSIS — M75101 Unspecified rotator cuff tear or rupture of right shoulder, not specified as traumatic: Secondary | ICD-10-CM

## 2015-03-16 NOTE — Telephone Encounter (Signed)
Per [facility] (Workers MetallurgistComp contact scheduler) and per nurse case manager Ian MalkinSherri McCormick, MRI w/joint contrast/Arthrogram has been scheduled at [facility], for 03/29/15, 10:30am; orders have been faxed to this provider to [phone](ph#[phone]); also spoke with Angelique Blonderenise at this facility. Patient aware of appointment and also of follow up visit scheduled here for review of results.

## 2015-03-20 ENCOUNTER — Ambulatory Visit: Payer: Self-pay | Admitting: Orthopedic Surgery

## 2015-03-22 ENCOUNTER — Other Ambulatory Visit: Payer: Self-pay | Admitting: *Deleted

## 2015-03-22 ENCOUNTER — Telehealth: Payer: Self-pay | Admitting: Orthopedic Surgery

## 2015-03-22 MED ORDER — HYDROCODONE-ACETAMINOPHEN 10-325 MG PO TABS: 1.0000 | ORAL_TABLET | ORAL | Status: DC | PRN

## 2015-03-22 NOTE — Telephone Encounter (Signed)
Patient picked up Rx

## 2015-03-22 NOTE — Telephone Encounter (Signed)
Prescription available, patient aware  

## 2015-03-22 NOTE — Telephone Encounter (Signed)
Patient is calling requesting a refill on pain medication HYDROcodone-acetaminophen (NORCO) 10-325 MG per tablet please advise?

## 2015-03-27 ENCOUNTER — Ambulatory Visit: Payer: Self-pay | Admitting: Orthopedic Surgery

## 2015-03-30 ENCOUNTER — Encounter: Payer: Self-pay | Admitting: Orthopedic Surgery

## 2015-03-30 ENCOUNTER — Ambulatory Visit (INDEPENDENT_AMBULATORY_CARE_PROVIDER_SITE_OTHER): Payer: Worker's Compensation | Admitting: Orthopedic Surgery

## 2015-03-30 VITALS — BP 104/75 | Ht 69.0 in | Wt 230.0 lb

## 2015-03-30 DIAGNOSIS — Z9889 Other specified postprocedural states: Secondary | ICD-10-CM

## 2015-03-30 DIAGNOSIS — G5621 Lesion of ulnar nerve, right upper limb: Secondary | ICD-10-CM | POA: Diagnosis not present

## 2015-03-30 MED ORDER — HYDROCODONE-ACETAMINOPHEN 10-325 MG PO TABS: 1.0000 | ORAL_TABLET | ORAL | Status: DC | PRN

## 2015-03-30 MED ORDER — OMEPRAZOLE 20 MG PO CPDR: 20.0000 mg | DELAYED_RELEASE_CAPSULE | Freq: Every day | ORAL | Status: DC

## 2015-03-30 NOTE — Progress Notes (Signed)
Patient ID: Alexandria PeltMichelle M Mejia, female   DOB: [birth year], 44 y.o.   MRN: [id number] Follow-up visit postop status post rotator cuff repair 10/12/2014  The patient is on a home exercise program  She complains of burning pain in her right arm which radiates to the small and ring finger of her right upper extremity  She had a second opinion done by Dr. Ophelia CharterYates, he recommended home exercises with a pulley.  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  Range of motion in abduction is only about 60 even with home exercise program  At this point she is about [facility] and she has documented evidence of a intact cuff repair  There is really no reason why she shouldn't be able to rehabilitation the shoulder and regain normal activity however she has persistent burning pain in the arm and I think she should have a nerve conduction study to try to document why that is occurring and we will go ahead and get that approved. We also ordered a pulley for her to work with at home along with the cane assisted exercises  Follow-up after nerve test. Prescriptions were refilled as follows  Meds ordered this encounter  Medications  . omeprazole (PRILOSEC) 20 MG capsule    Sig: Take 1 capsule (20 mg total) by mouth daily.    Dispense:  30 capsule    Refill:  0  . HYDROcodone-acetaminophen (NORCO) 10-325 MG per tablet    Sig: Take 1 tablet by mouth every 4 (four) hours as needed (pain).    Dispense:  84 tablet    Refill:  0

## 2015-03-30 NOTE — Patient Instructions (Signed)
NERVE CONDUCTION STUDY UPPER EXTREMITIES  PULLEY  FOLLOW UP AFTER NCS

## 2015-04-06 ENCOUNTER — Telehealth: Payer: Self-pay | Admitting: Orthopedic Surgery

## 2015-04-06 NOTE — Telephone Encounter (Signed)
Notes for date of service had been faxed to Workers comp [employer], fax#[phone], to attention, Ian MalkinSherri McCormick and to nurse case manager, Lorinda CreedCheri Yates, fax# [phone] (on 04/03/15).  On 04/06/15, I called back to both [facility] [employer] and to Cheri Yates(cell# [phone]) to check status of referral to neurologist regarding NCS/EMG.  Patient is requesting Dr Beryle BeamsKofi Doonquah if he is an in-network provider.

## 2015-04-10 NOTE — Telephone Encounter (Signed)
Received call from Workers Comp's 3rd party contact, [facility], regarding Referral for NCS - states approved for [facility] in [place]. They are requesting orders to be faxed to this provider directly, to Fax # [phone] ph# [phone].

## 2015-04-11 ENCOUNTER — Other Ambulatory Visit: Payer: Self-pay | Admitting: *Deleted

## 2015-04-11 DIAGNOSIS — R202 Paresthesia of skin: Principal | ICD-10-CM

## 2015-04-11 DIAGNOSIS — R200 Anesthesia of skin: Secondary | ICD-10-CM

## 2015-04-11 NOTE — Telephone Encounter (Signed)
Order printed to send

## 2015-04-11 NOTE — Telephone Encounter (Signed)
[facility] has appointment at [facility] for NCS May 20 11am  Will fax order

## 2015-04-19 ENCOUNTER — Telehealth: Payer: Self-pay | Admitting: Orthopedic Surgery

## 2015-04-19 ENCOUNTER — Other Ambulatory Visit: Payer: Self-pay | Admitting: *Deleted

## 2015-04-19 MED ORDER — HYDROCODONE-ACETAMINOPHEN 10-325 MG PO TABS: 1.0000 | ORAL_TABLET | ORAL | Status: DC | PRN

## 2015-04-19 NOTE — Telephone Encounter (Signed)
Patient called to request refill of medication: HYDROcodone-acetaminophen (NORCO) 10-325 MG per tablet [[id number]][[id number]] - cell#[phone]

## 2015-04-20 NOTE — Telephone Encounter (Signed)
Patient picked up Rx

## 2015-04-20 NOTE — Telephone Encounter (Signed)
Prescription available, called patient, unavailable 

## 2015-05-04 ENCOUNTER — Other Ambulatory Visit: Payer: Self-pay | Admitting: *Deleted

## 2015-05-04 ENCOUNTER — Telehealth: Payer: Self-pay | Admitting: Orthopedic Surgery

## 2015-05-04 DIAGNOSIS — M75101 Unspecified rotator cuff tear or rupture of right shoulder, not specified as traumatic: Secondary | ICD-10-CM

## 2015-05-04 MED ORDER — HYDROCODONE-ACETAMINOPHEN 10-325 MG PO TABS: 1.0000 | ORAL_TABLET | ORAL | Status: DC | PRN

## 2015-05-04 NOTE — Telephone Encounter (Signed)
Patient aware prescription is ready for pick up

## 2015-05-04 NOTE — Telephone Encounter (Signed)
Patient picked up prescription.

## 2015-05-04 NOTE — Telephone Encounter (Signed)
Patient called for refill of medication: HYDROcodone-acetaminophen (NORCO) 10-325 MG per tablet [[id number]][[id number]] - ph#'s [phone] (Home) or [phone] (Cell)

## 2015-05-09 ENCOUNTER — Telehealth: Payer: Self-pay | Admitting: Orthopedic Surgery

## 2015-05-09 ENCOUNTER — Encounter: Payer: Self-pay | Admitting: Orthopedic Surgery

## 2015-05-09 ENCOUNTER — Ambulatory Visit (INDEPENDENT_AMBULATORY_CARE_PROVIDER_SITE_OTHER): Payer: Worker's Compensation | Admitting: Orthopedic Surgery

## 2015-05-09 VITALS — BP 129/77 | Ht 69.0 in | Wt 230.0 lb

## 2015-05-09 DIAGNOSIS — Z9889 Other specified postprocedural states: Secondary | ICD-10-CM

## 2015-05-09 DIAGNOSIS — M792 Neuralgia and neuritis, unspecified: Secondary | ICD-10-CM | POA: Diagnosis not present

## 2015-05-09 NOTE — Progress Notes (Addendum)
Chief Complaint  Patient presents with  . Results    Nerve conduction study review    Molli KnockOkay [name] is status post second repair of her rotator cuff. She has not recovered well. She had a second opinion. He was recommended by Dr. Ophelia CharterYates that she undergo home exercises with a pulley  She had a postop MRI arthrogram after the second surgery that showed her cuff is intact. She did have some acromioclavicular joint arthrosis but her symptoms don't show any discomfort in that area  She still has burning pain over the right deltoid and intermittent numbness and tingling of the small finger and ring finger. We center for nerve conduction study she has no proximal lesion but she did have symptoms of carpal tunnel syndrome.  Obviously her symptoms don't match her testing  Review of Systems - General ROS: negative for - chills or fever Musculoskeletal ROS: positive for - joint pain, joint stiffness, muscular weakness and pain in hand - right and shoulder - right, upper Neurological ROS: positive for - numbness/tingling At this point her prognosis is not good. I would recommend she see a hand specialist to delineate these weird symptoms in her hand I don't match her nerve conduction study and that she continue with home exercises and her current medications   Current outpatient prescriptions:  .  diclofenac (VOLTAREN) 25 MG EC tablet, Take 1 tablet (25 mg total) by mouth 3 (three) times daily as needed., Disp: 30 tablet, Rfl: 0 .  HYDROcodone-acetaminophen (NORCO) 10-325 MG per tablet, Take 1 tablet by mouth every 4 (four) hours as needed (pain)., Disp: 84 tablet, Rfl: 0 .  ibuprofen (ADVIL,MOTRIN) 800 MG tablet, Take 1 tablet (800 mg total) by mouth 3 (three) times daily., Disp: 90 tablet, Rfl: 5 .  omeprazole (PRILOSEC) 20 MG capsule, Take 1 capsule (20 mg total) by mouth daily., Disp: 30 capsule, Rfl: 0 .  methocarbamol (ROBAXIN) 750 MG tablet, Take 1 tablet (750 mg total) by mouth 4 (four) times  daily., Disp: 60 tablet, Rfl: 2  I will see her on a monthly basis.

## 2015-05-09 NOTE — Telephone Encounter (Signed)
Notes, date of service 05/09/15, faxed to Workers comp insurer, [employer], to fax# [phone], ph# [phone](copy to nurse manager Lorinda CreedCheri Yates, fax# [phone].

## 2015-05-09 NOTE — Patient Instructions (Signed)
Continue home exercises.

## 2015-05-18 ENCOUNTER — Other Ambulatory Visit: Payer: Self-pay | Admitting: *Deleted

## 2015-05-18 ENCOUNTER — Telehealth: Payer: Self-pay | Admitting: Orthopedic Surgery

## 2015-05-18 DIAGNOSIS — M75101 Unspecified rotator cuff tear or rupture of right shoulder, not specified as traumatic: Secondary | ICD-10-CM

## 2015-05-18 MED ORDER — HYDROCODONE-ACETAMINOPHEN 10-325 MG PO TABS: 1.0000 | ORAL_TABLET | ORAL | Status: DC | PRN

## 2015-05-18 NOTE — Telephone Encounter (Signed)
Patient is calling requesting a refill on HYDROcodone-acetaminophen (NORCO) 10-325 MG per tablet  And nausea medication as well, please advise?

## 2015-05-18 NOTE — Telephone Encounter (Signed)
Patient Picked up Rx 

## 2015-05-18 NOTE — Telephone Encounter (Signed)
Prescription available, patient aware  

## 2015-05-29 ENCOUNTER — Telehealth: Payer: Self-pay | Admitting: Orthopedic Surgery

## 2015-05-29 ENCOUNTER — Other Ambulatory Visit: Payer: Self-pay | Admitting: *Deleted

## 2015-05-29 NOTE — Telephone Encounter (Signed)
Patient called to request refills of: (1) HYDROcodone-acetaminophen (NORCO) 10-325 MG per tablet [[id number]]  And  (2) Nausea medication - [facility] Ph# [phone]

## 2015-05-29 NOTE — Telephone Encounter (Signed)
Not due until 6/16.

## 2015-05-31 ENCOUNTER — Other Ambulatory Visit: Payer: Self-pay | Admitting: *Deleted

## 2015-05-31 DIAGNOSIS — M75101 Unspecified rotator cuff tear or rupture of right shoulder, not specified as traumatic: Secondary | ICD-10-CM

## 2015-05-31 MED ORDER — HYDROCODONE-ACETAMINOPHEN 10-325 MG PO TABS: 1.0000 | ORAL_TABLET | ORAL | Status: DC | PRN

## 2015-05-31 MED ORDER — PROMETHAZINE HCL 25 MG PO TABS: 25.0000 mg | ORAL_TABLET | Freq: Four times a day (QID) | ORAL | Status: DC | PRN

## 2015-05-31 NOTE — Telephone Encounter (Signed)
Patient is asking if Rx will be ready tomorrow, please advise?

## 2015-05-31 NOTE — Telephone Encounter (Signed)
yes

## 2015-06-01 ENCOUNTER — Other Ambulatory Visit: Payer: Self-pay | Admitting: *Deleted

## 2015-06-01 NOTE — Telephone Encounter (Signed)
Prescription available, patient aware  

## 2015-06-01 NOTE — Telephone Encounter (Signed)
Patient has picked up Rx.

## 2015-06-06 ENCOUNTER — Encounter: Payer: Self-pay | Admitting: Orthopedic Surgery

## 2015-06-06 ENCOUNTER — Ambulatory Visit (INDEPENDENT_AMBULATORY_CARE_PROVIDER_SITE_OTHER): Payer: Worker's Compensation | Admitting: Orthopedic Surgery

## 2015-06-06 VITALS — BP 124/83 | Ht 69.0 in | Wt 230.0 lb

## 2015-06-06 DIAGNOSIS — Z9889 Other specified postprocedural states: Secondary | ICD-10-CM

## 2015-06-06 DIAGNOSIS — M75101 Unspecified rotator cuff tear or rupture of right shoulder, not specified as traumatic: Secondary | ICD-10-CM

## 2015-06-06 DIAGNOSIS — M792 Neuralgia and neuritis, unspecified: Secondary | ICD-10-CM

## 2015-06-06 DIAGNOSIS — G5621 Lesion of ulnar nerve, right upper limb: Secondary | ICD-10-CM | POA: Diagnosis not present

## 2015-06-06 NOTE — Progress Notes (Signed)
Chief Complaint  Patient presents with  . Follow-up    1 month follow up Rt rotator cuff, s/p home exerises, DOS 10/12/14    Encounter Diagnoses  Name Primary?  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 Neuralgia and neuritis   . S/P shoulder surgery   . Right rotator cuff tear    System review  No new findings   Update on Alexandria Mejia. She of course is having ongoing problems with her right shoulder. She has not regained any reasonable range of motion despite home exercises with a poorly she still has ulnar nerve symptoms despite nerve conduction study showing carpal tunnel median nerve compression  Her range of motion is not improved much she has passive range of motion of 50 of external rotation versus 30 of active external rotation  Her abduction is 90 passive and flexion is about 80  I will continue to see her on a monthly basis  She is on ibuprofen promethazine Prilosec Robaxin and hydrocodone  I still think she needs to see an upper extremity hand specialist to evaluate this neuralgia that is going on in the right arm despite workers compensations denial  She is not ready to go back to work even light duty  She will continue on her current medications and her home exercise program and I will see her again in a month

## 2015-06-08 ENCOUNTER — Telehealth: Payer: Self-pay | Admitting: Orthopedic Surgery

## 2015-06-08 NOTE — Telephone Encounter (Addendum)
Notes faxed 06/07/15 to Workers comp, [employer] to [employer] [phone]+to attention Sherri McCormick's direct fax# [phone]; on 06/08/15, Sherri called back to relay that Workers comp will approve, per Dr Mort Sawyers note of 06/06/15, to set up appointment with an upper extremity hand specialist to evaluate the neuralgia; states we will be notified when this has been scheduled.  Patient aware of status.  *06/20/15 Called back to Workers comp to follow up with Ian Malkin, nurse case manager, ph [phone], Ext [phone]; states she is still working on setting up appointment with an upper extremity hand specialist; patient aware of status.

## 2015-06-12 ENCOUNTER — Telehealth: Payer: Self-pay | Admitting: Orthopedic Surgery

## 2015-06-12 ENCOUNTER — Other Ambulatory Visit: Payer: Self-pay | Admitting: *Deleted

## 2015-06-12 DIAGNOSIS — M75101 Unspecified rotator cuff tear or rupture of right shoulder, not specified as traumatic: Secondary | ICD-10-CM

## 2015-06-12 MED ORDER — HYDROCODONE-ACETAMINOPHEN 10-325 MG PO TABS: 1.0000 | ORAL_TABLET | ORAL | Status: DC | PRN

## 2015-06-12 NOTE — Telephone Encounter (Signed)
Patient calling requesting a refill on HYDROcodone-acetaminophen (NORCO) 10-325 MG per tablet please advise?

## 2015-06-13 NOTE — Telephone Encounter (Signed)
Prescription available, patient aware  

## 2015-06-13 NOTE — Telephone Encounter (Signed)
Patient picked up Rx

## 2015-06-29 ENCOUNTER — Other Ambulatory Visit: Payer: Self-pay | Admitting: *Deleted

## 2015-06-29 ENCOUNTER — Telehealth: Payer: Self-pay | Admitting: Orthopedic Surgery

## 2015-06-29 DIAGNOSIS — M75101 Unspecified rotator cuff tear or rupture of right shoulder, not specified as traumatic: Secondary | ICD-10-CM

## 2015-06-29 MED ORDER — HYDROCODONE-ACETAMINOPHEN 10-325 MG PO TABS: 1.0000 | ORAL_TABLET | ORAL | Status: DC | PRN

## 2015-06-29 NOTE — Telephone Encounter (Signed)
Prescription available, patient aware  

## 2015-06-29 NOTE — Telephone Encounter (Signed)
Patient is calling requesting a medication refill on HYDROcodone-acetaminophen (NORCO) 10-325 MG per tablet [[id number]][[id number]] please advise?

## 2015-06-30 ENCOUNTER — Encounter: Payer: Self-pay | Admitting: Orthopedic Surgery

## 2015-06-30 NOTE — Telephone Encounter (Signed)
Patient picked up Rx

## 2015-07-06 ENCOUNTER — Encounter: Payer: Self-pay | Admitting: Orthopedic Surgery

## 2015-07-06 ENCOUNTER — Ambulatory Visit (INDEPENDENT_AMBULATORY_CARE_PROVIDER_SITE_OTHER): Payer: Worker's Compensation | Admitting: Orthopedic Surgery

## 2015-07-06 ENCOUNTER — Telehealth: Payer: Self-pay | Admitting: Orthopedic Surgery

## 2015-07-06 VITALS — BP 112/75 | Ht 69.0 in | Wt 227.0 lb

## 2015-07-06 DIAGNOSIS — Z9889 Other specified postprocedural states: Secondary | ICD-10-CM

## 2015-07-06 DIAGNOSIS — M75101 Unspecified rotator cuff tear or rupture of right shoulder, not specified as traumatic: Secondary | ICD-10-CM

## 2015-07-06 DIAGNOSIS — G5621 Lesion of ulnar nerve, right upper limb: Secondary | ICD-10-CM

## 2015-07-06 MED ORDER — IBUPROFEN 800 MG PO TABS: 800.0000 mg | ORAL_TABLET | Freq: Three times a day (TID) | ORAL | Status: DC

## 2015-07-06 MED ORDER — HYDROCODONE-ACETAMINOPHEN 10-325 MG PO TABS: 1.0000 | ORAL_TABLET | ORAL | Status: DC | PRN

## 2015-07-06 MED ORDER — METHOCARBAMOL 750 MG PO TABS: 750.0000 mg | ORAL_TABLET | Freq: Four times a day (QID) | ORAL | Status: DC

## 2015-07-06 NOTE — Progress Notes (Signed)
Patient ID: Alexandria Mejia, female   DOB: 11-Mar-1971, 44 y.o.   MRN: [id number]  Follow up visit  Chief Complaint  Patient presents with  . Follow-up    1 month follow up; right rcr, October 2015 revision right rotator cuff repair    BP 112/75 mmHg  Ht  (1.753 m)  Wt 227 lb (102.967 kg)  BMI 33.51 kg/m2  Encounter Diagnoses  Name Primary?  . Rotator cuff tear, right   . Ulnar neuropathy of right upper extremity   . S/P shoulder surgery Yes    [name] is now doing her own therapy with a pulley. Her external rotation is 45 her passive forward elevation is 100 she still has rotator cuff weakness. We have a postop revision MRI scan that shows that the cuff repair is intact.  She still has these upper extremity neuropathies which we are trying to get a hand surgeon to evaluate whether this is carpal tunnel or ulnar nerve syndrome or something more proximal. We have worked her neck up as well and that proved inconclusive and nondiagnostic  That is to say she had no neck pathology to explain her arm symptoms  At this point we will continue with pulley exercises and hope that she will turn the corner. She has made some progress in external rotation and forward elevation since her last visit  Continue current medications and out of work status. Follow-up one month

## 2015-07-06 NOTE — Telephone Encounter (Signed)
Followed up with Worker's comp nurse case manager Ian Malkin, on referral to upper extremity hand specialist appointment via fax (along with fax of today's, 07/06/15, office visit notes and continued out of work note.) Fax# [phone]+copy to direct fax [phone] / ph# [phone] ext [phone].

## 2015-07-06 NOTE — Telephone Encounter (Signed)
Notes faxed (date of service, today, 07/06/15) to Workers Comp insurer, [employer], to [employer], Fax #[phone] (ph# [phone], Ext [phone]; also included note of inquiry of status of referral to upper extremity hand specialist, which has been approved by Workers comp as of 06/08/15, however, no appointment information has been received.  Patient notified of current status.

## 2015-07-10 NOTE — Telephone Encounter (Signed)
Call received from Workers comp nurse case Production designer, theatre/television/film, Ian Malkin, ph# [phone], relaying that [facility] has agreed to see patient, upon insurer sending pre-payment.  Once received, appointment will be scheduled and our office will be notified.  I called patient and relayed status.

## 2015-07-17 NOTE — Telephone Encounter (Signed)
Call received from Workers comp nurse case manager Sherri McCormick,direct [phone], fax# [phone]  with the 2nd opinion appointment information:  Patient is scheduled at [facility], Dr. Janee Morn, 07/18/15, at 10:00am; message left for patient to notify.

## 2015-07-25 ENCOUNTER — Other Ambulatory Visit: Payer: Self-pay | Admitting: *Deleted

## 2015-07-25 ENCOUNTER — Telehealth: Payer: Self-pay | Admitting: Orthopedic Surgery

## 2015-07-25 DIAGNOSIS — M75101 Unspecified rotator cuff tear or rupture of right shoulder, not specified as traumatic: Secondary | ICD-10-CM

## 2015-07-25 MED ORDER — HYDROCODONE-ACETAMINOPHEN 10-325 MG PO TABS: 1.0000 | ORAL_TABLET | ORAL | Status: DC | PRN

## 2015-07-25 NOTE — Telephone Encounter (Signed)
Prescription available, patient aware  

## 2015-08-03 ENCOUNTER — Ambulatory Visit (INDEPENDENT_AMBULATORY_CARE_PROVIDER_SITE_OTHER): Payer: Worker's Compensation | Admitting: Orthopedic Surgery

## 2015-08-03 ENCOUNTER — Encounter: Payer: Self-pay | Admitting: Orthopedic Surgery

## 2015-08-03 VITALS — BP 117/81 | Ht 69.0 in | Wt 220.0 lb

## 2015-08-03 DIAGNOSIS — G5621 Lesion of ulnar nerve, right upper limb: Secondary | ICD-10-CM | POA: Diagnosis not present

## 2015-08-03 DIAGNOSIS — Z9889 Other specified postprocedural states: Secondary | ICD-10-CM

## 2015-08-03 NOTE — Progress Notes (Signed)
Progress note  The patient comes in for her regular one-month follow-up status post revision rotator cuff repair with continued weakness and lack of motion despite postop MRI with contrast showing repair is intact  She is on a home exercise program her active abduction and flexion is 90  She had a second opinion and it documented she had an ulnar nerve problem at the elbow. Recommendations were for ulnar nerve transposition. I agree with those recommendations. She should proceed with surgery regarding this ulnar neuropathy. It is not a procedure that I perform so I will recommend that the workers compensation carrier send her to a physician of their choice to get this done.  I will continue to monitor her shoulder and she will follow-up in a month

## 2015-08-07 ENCOUNTER — Telehealth: Payer: Self-pay | Admitting: Orthopedic Surgery

## 2015-08-07 ENCOUNTER — Other Ambulatory Visit: Payer: Self-pay | Admitting: *Deleted

## 2015-08-07 DIAGNOSIS — M75101 Unspecified rotator cuff tear or rupture of right shoulder, not specified as traumatic: Secondary | ICD-10-CM

## 2015-08-07 MED ORDER — HYDROCODONE-ACETAMINOPHEN 10-325 MG PO TABS: 1.0000 | ORAL_TABLET | ORAL | Status: DC | PRN

## 2015-08-07 NOTE — Telephone Encounter (Signed)
Prescription available, patient aware  

## 2015-08-07 NOTE — Telephone Encounter (Signed)
Patient requests refill on medication: HYDROcodone-acetaminophen (NORCO) 10-325 MG per tablet [[id number]] - her home # is [phone]

## 2015-08-08 NOTE — Telephone Encounter (Signed)
Patient picked up prescription.

## 2015-08-22 ENCOUNTER — Telehealth: Payer: Self-pay | Admitting: Orthopedic Surgery

## 2015-08-22 ENCOUNTER — Other Ambulatory Visit: Payer: Self-pay | Admitting: *Deleted

## 2015-08-22 DIAGNOSIS — M75101 Unspecified rotator cuff tear or rupture of right shoulder, not specified as traumatic: Secondary | ICD-10-CM

## 2015-08-22 MED ORDER — PROMETHAZINE HCL 25 MG PO TABS: 25.0000 mg | ORAL_TABLET | Freq: Four times a day (QID) | ORAL | Status: DC | PRN

## 2015-08-22 MED ORDER — OMEPRAZOLE 20 MG PO CPDR: 20.0000 mg | DELAYED_RELEASE_CAPSULE | Freq: Every day | ORAL | Status: DC

## 2015-08-22 MED ORDER — HYDROCODONE-ACETAMINOPHEN 10-325 MG PO TABS: 1.0000 | ORAL_TABLET | ORAL | Status: DC | PRN

## 2015-08-22 NOTE — Telephone Encounter (Signed)
(  1) Called patient to relay prescription(pain medicine) ready for pick-up; no answer and no answer machine. (2) Received call from Workers comp Financial risk analyst, Tour manager - states received back a medication review, questioning the refill of Prilosec and Phenergan PO.  Please advise - Her direct ph# [phone].

## 2015-08-22 NOTE — Telephone Encounter (Signed)
Patient is requesting a refill on omeprazole (PRILOSEC) 20 MG capsule and Promethazine HCl (PHENERGAN PO) and HYDROcodone-acetaminophen (NORCO) 10-325 MG per tablet please advise?

## 2015-08-22 NOTE — Telephone Encounter (Signed)
Same reason as originally given  On chronic IB needs Gi protection

## 2015-08-22 NOTE — Telephone Encounter (Signed)
Routing to Dr Harrison 

## 2015-08-23 NOTE — Telephone Encounter (Signed)
RETURNED CALL, NO ANSWER, LEFT VM 

## 2015-08-24 NOTE — Telephone Encounter (Signed)
Nurse manager aware.

## 2015-08-29 ENCOUNTER — Telehealth: Payer: Self-pay | Admitting: Orthopedic Surgery

## 2015-08-29 NOTE — Telephone Encounter (Signed)
Notes for date of service 08/03/15 faxed to Workers comp insurer, [employer] to attention Ian Malkin, nurse case manager, on 08/08/15 (HIM/ROI release of information).  Patient aware.

## 2015-08-31 ENCOUNTER — Ambulatory Visit (INDEPENDENT_AMBULATORY_CARE_PROVIDER_SITE_OTHER): Payer: Worker's Compensation | Admitting: Orthopedic Surgery

## 2015-08-31 ENCOUNTER — Encounter: Payer: Self-pay | Admitting: Orthopedic Surgery

## 2015-08-31 ENCOUNTER — Telehealth: Payer: Self-pay | Admitting: Orthopedic Surgery

## 2015-08-31 VITALS — BP 109/66 | Ht 69.0 in | Wt 220.0 lb

## 2015-08-31 DIAGNOSIS — Z9889 Other specified postprocedural states: Secondary | ICD-10-CM

## 2015-08-31 DIAGNOSIS — G5621 Lesion of ulnar nerve, right upper limb: Secondary | ICD-10-CM

## 2015-08-31 NOTE — Progress Notes (Signed)
Patient ID: Alexandria Mejia, female   DOB: March 08, 1971, 44 y.o.   MRN: [id number]  Follow up visit  Chief Complaint  Patient presents with  . Follow-up    4 week follow up RT RCR, DOS 10/12/14    BP 109/66 mmHg  Ht  (1.753 m)  Wt 220 lb (99.791 kg)  BMI 32.47 kg/m2  No diagnosis found.  Routine follow-up today's measurements  Active range of motion flexion 85 (passive range of motion 110) extension 45 Abduction 70 with adduction 45  Internal/external rotation is 70-45 in the abductor position  No new complaints basically still has pain and stiffness  As noted for updating purposes MRI post surgery #2 shows rotator cuff intact  Patient will see specialist regarding ulnar nerve symptoms  Follow-up in a month   Last refills were not obtained secondary to workers compensation denial  Basically the patient will be on Norco ibuprofen permanently and will need prilosec to cover her for GI tract and prevent ulcers

## 2015-08-31 NOTE — Telephone Encounter (Signed)
Per Workers comp nurse case manager Ian Malkin, ph# [phone], relays that Dr. Mack Hook, [facility], has agreed to accept care of this patient, and will be prescribing medications:  Gabapentin, Lyrica, and Nortriptyline; states she is trying to reach patient to notify of this information.  Patient has been scheduled to follow back up in our office in one month (10/02/15 scheduled), per today's visit, 08/31/15.   * Faxing office notes to Ms. McCormick's attention, [address] Workers comp, fax# [phone]; ph# above.

## 2015-09-01 NOTE — Telephone Encounter (Signed)
yes

## 2015-09-01 NOTE — Telephone Encounter (Signed)
Not sure what this means? Will they be taking over her care?

## 2015-09-07 ENCOUNTER — Other Ambulatory Visit: Payer: Self-pay | Admitting: *Deleted

## 2015-09-07 ENCOUNTER — Telehealth: Payer: Self-pay | Admitting: Orthopedic Surgery

## 2015-09-07 DIAGNOSIS — M75101 Unspecified rotator cuff tear or rupture of right shoulder, not specified as traumatic: Secondary | ICD-10-CM

## 2015-09-07 MED ORDER — HYDROCODONE-ACETAMINOPHEN 10-325 MG PO TABS: 1.0000 | ORAL_TABLET | ORAL | Status: DC | PRN

## 2015-09-07 NOTE — Telephone Encounter (Signed)
Routing to Dr Harrison 

## 2015-09-07 NOTE — Telephone Encounter (Signed)
Rx is ready for pick up, patient is aware 

## 2015-09-07 NOTE — Telephone Encounter (Signed)
Patient called to (1) request refill of pain medication HYDROcodone-acetaminophen (NORCO) 10-325 MG per tablet [[id number]]  (2nd) to request that the note of request that patient "may continue All medications" (states was faxed to our office from Workers' comp adjuster [place], [place]), in order for patient to be able to pick up her medications at pharmacy, and her pain medicine prescription here at office. His fax# is [phone] / ph#[phone],extension [phone]. Please advise.  Patient's current alternate ph# is [phone]

## 2015-09-11 NOTE — Telephone Encounter (Signed)
09/08/15 Patient picked up prescription.

## 2015-09-14 ENCOUNTER — Ambulatory Visit: Payer: Worker's Compensation | Admitting: Orthopedic Surgery

## 2015-09-28 ENCOUNTER — Other Ambulatory Visit: Payer: Self-pay | Admitting: *Deleted

## 2015-09-28 ENCOUNTER — Telehealth: Payer: Self-pay | Admitting: Orthopedic Surgery

## 2015-09-28 DIAGNOSIS — M75101 Unspecified rotator cuff tear or rupture of right shoulder, not specified as traumatic: Secondary | ICD-10-CM

## 2015-09-28 MED ORDER — HYDROCODONE-ACETAMINOPHEN 10-325 MG PO TABS: 1.0000 | ORAL_TABLET | ORAL | Status: DC | PRN

## 2015-09-28 NOTE — Telephone Encounter (Signed)
Patient is calling requesting a refill on medication HYDROcodone-acetaminophen (NORCO) 10-325 MG per tablet  Please advise?

## 2015-09-28 NOTE — Telephone Encounter (Signed)
Prescription available, called patient, no answer 

## 2015-10-02 ENCOUNTER — Encounter: Payer: Self-pay | Admitting: Orthopedic Surgery

## 2015-10-02 ENCOUNTER — Ambulatory Visit (INDEPENDENT_AMBULATORY_CARE_PROVIDER_SITE_OTHER): Payer: Worker's Compensation | Admitting: Orthopedic Surgery

## 2015-10-02 VITALS — BP 119/79 | Ht 69.0 in | Wt 220.0 lb

## 2015-10-02 DIAGNOSIS — Z9889 Other specified postprocedural states: Secondary | ICD-10-CM

## 2015-10-02 DIAGNOSIS — G5621 Lesion of ulnar nerve, right upper limb: Secondary | ICD-10-CM

## 2015-10-02 DIAGNOSIS — M792 Neuralgia and neuritis, unspecified: Secondary | ICD-10-CM | POA: Diagnosis not present

## 2015-10-02 NOTE — Progress Notes (Signed)
Monthly follow-up for Alexandria Mejia nothing new to add. She is currently on ibuprofen with Prilosec to cover her stomach and then Norco 10 mg. She will see a doctor today regarding her ulnar nerve problem  I will follow up with her in a month.  I am pleased that we now have approximate 50 of external rotation. We are still limited to active flexion 85 passive 110 abduction 70 45 adduction. Continue home therapy and I'll see her next month

## 2015-10-02 NOTE — Telephone Encounter (Signed)
Patient picked up Rx

## 2015-10-12 ENCOUNTER — Other Ambulatory Visit: Payer: Self-pay | Admitting: *Deleted

## 2015-10-12 ENCOUNTER — Telehealth: Payer: Self-pay | Admitting: Orthopedic Surgery

## 2015-10-12 DIAGNOSIS — M75101 Unspecified rotator cuff tear or rupture of right shoulder, not specified as traumatic: Secondary | ICD-10-CM

## 2015-10-12 MED ORDER — HYDROCODONE-ACETAMINOPHEN 10-325 MG PO TABS: 1.0000 | ORAL_TABLET | ORAL | Status: DC | PRN

## 2015-10-12 NOTE — Telephone Encounter (Signed)
I clarifed with patient when I took the message, and she asked for the pain medication, and stated that she would have a ride tomorrow morning if ready then.

## 2015-10-12 NOTE — Telephone Encounter (Signed)
Hydrocodone is not due, was she requesting something else?  Called no answer

## 2015-10-12 NOTE — Telephone Encounter (Signed)
Call from patient, requests refill on medication: HYDROcodone-acetaminophen (NORCO) 10-325 MG tablet [[id number]][[id number]] - ph# is [phone]

## 2015-10-13 NOTE — Telephone Encounter (Signed)
Called patient to notify prescription ready for pick-up, which is alternate cell#; left general message to return call.

## 2015-10-17 NOTE — Telephone Encounter (Signed)
10/16/15 Patient picked up prescription.

## 2015-10-30 ENCOUNTER — Telehealth: Payer: Self-pay | Admitting: *Deleted

## 2015-10-30 ENCOUNTER — Other Ambulatory Visit: Payer: Self-pay | Admitting: *Deleted

## 2015-10-30 DIAGNOSIS — M75101 Unspecified rotator cuff tear or rupture of right shoulder, not specified as traumatic: Secondary | ICD-10-CM

## 2015-10-30 MED ORDER — HYDROCODONE-ACETAMINOPHEN 10-325 MG PO TABS: 1.0000 | ORAL_TABLET | ORAL | Status: DC | PRN

## 2015-10-30 NOTE — Telephone Encounter (Signed)
Patient has a appt 11/02/15, patient is requesting hydrocodone to be refilled, patient said she would like to pick it up tomorrow. Please advise [phone]

## 2015-10-31 NOTE — Telephone Encounter (Signed)
I tried to call patient to make her aware that her hydrocodone is ready both cell and home numbers are busy.

## 2015-11-02 ENCOUNTER — Ambulatory Visit (INDEPENDENT_AMBULATORY_CARE_PROVIDER_SITE_OTHER): Payer: Worker's Compensation | Admitting: Orthopedic Surgery

## 2015-11-02 ENCOUNTER — Other Ambulatory Visit: Payer: Self-pay | Admitting: Orthopedic Surgery

## 2015-11-02 ENCOUNTER — Encounter (HOSPITAL_BASED_OUTPATIENT_CLINIC_OR_DEPARTMENT_OTHER): Payer: Self-pay | Admitting: *Deleted

## 2015-11-02 ENCOUNTER — Telehealth: Payer: Self-pay | Admitting: Orthopedic Surgery

## 2015-11-02 ENCOUNTER — Encounter: Payer: Self-pay | Admitting: Orthopedic Surgery

## 2015-11-02 VITALS — BP 121/88 | Ht 69.0 in | Wt 231.0 lb

## 2015-11-02 DIAGNOSIS — Z9889 Other specified postprocedural states: Secondary | ICD-10-CM

## 2015-11-02 DIAGNOSIS — G5621 Lesion of ulnar nerve, right upper limb: Secondary | ICD-10-CM

## 2015-11-02 DIAGNOSIS — M792 Neuralgia and neuritis, unspecified: Secondary | ICD-10-CM | POA: Diagnosis not present

## 2015-11-02 MED ORDER — METHOCARBAMOL 750 MG PO TABS: 750.0000 mg | ORAL_TABLET | Freq: Four times a day (QID) | ORAL | Status: DC

## 2015-11-02 MED ORDER — OMEPRAZOLE 20 MG PO CPDR: 20.0000 mg | DELAYED_RELEASE_CAPSULE | Freq: Every day | ORAL | Status: DC

## 2015-11-02 NOTE — Progress Notes (Signed)
Chief Complaint  Patient presents with  . Follow-up    4 week follow up Right RCR, DOS 10/12/14    The patient continues with ongoing stiffness and weakness in the right shoulder status post repeat rotator cuff repair. We do have a postoperative MRI that shows there is no recurrence in her tear just weakness. She does have upcoming surgery on her ulnar nerve which I do not think is related to her stiffness in her shoulder  We will continue her with Norco 10 mg, Robaxin 750, Prilosec 20 to compensate for the chronic ibuprofen 800 mg  Return 1 month

## 2015-11-02 NOTE — Telephone Encounter (Signed)
Notes for today's date of service faxed to worker's comp Facilities managernurse manager, Roanna RaiderSherri.

## 2015-11-02 NOTE — Telephone Encounter (Addendum)
Call received from Workers comp nurse case manager Sherri McCormick, [employer] to notify that patient's surgery has been approved, with Dr Mack Hookavid Thompson, [facility] (the surgeon to whom patient has been referred as noted).  Patient has been made aware, and she is to contact Melene PlanKasey Fitzgerald at their office, ph# [phone]. Work comp contact information: Ian MalkinSherri McCormick, Fax #[phone] (ph# [phone], Ext [phone])

## 2015-11-08 NOTE — H&P (Signed)
Alexandria Mejia is an 44 y.o. female.   CC / Reason for Visit: Right hand burning, numbness and tingling and medial elbow pain HPI:  This patient presents for reevaluation, having been originally evaluated as a second opinion evaluation on 07-18-15.  She reports that she was placed on gabapentin, and still her symptoms persist.  She notes that her ring finger and small finger on the right hand are numb as we visit today, and that she is progressively dropping objects.  HPI 07-18-15: This patient is a 44 year old female who presents for evaluation of her right upper extremity.  She reports that in her occupation is a grave digger, a grave collapsed and she fell landing on her right shoulder.  As she tells the sequence of events, she indicates that she forcibly flexed her elbows at the same time.  She reports that the symptoms that she is having in her elbow and in her hand began at the time of her injury and have not changed to the course of her operative shoulder treatment.  She has had 2 operations on her shoulder at this point.  She has engaged in therapy.  She reports not having taken any neuroleptic medications such as gabapentin, Lyrica, or nortriptyline.  She reports that she gets burning, numbness, and tingling into the ring and small fingers on the ulnar border of the hand when her elbow is held flexed.  Sometimes she feels as if there something rolling or popping on the posterior medial aspect of the elbow.  She did undergo electrodiagnostic studies at [facility], performed by Dr. Manon Hilding, on 05-05-15.  At that time, it was noted that she had a positive Tinel sign over the ulnar nerve at the elbow on the right side, but her electrodiagnostic studies failed to provide evidence for ulnar neuropathy.  Right median neuropathy at the wrist was noted.  Past Medical History  Diagnosis Date  . Back pain   . GERD (gastroesophageal reflux disease)   . Migraine   . Complication of anesthesia     patient states "with last surgery I was hard to wake up".    Past Surgical History  Procedure Laterality Date  . Cholecystectomy    . Appendectomy    . Neck surgery      decompression of 7 disc  . Back surgery      lumbar-herniated disc  . Shoulder arthroscopy with rotator cuff repair Right 05/27/2014    Procedure: SHOULDER ARTHROSCOPY WITH ROTATOR CUFF REPAIR, subscalpularis repair, open supraspinatus repair;  Surgeon: Vickki Hearing, MD;  Location: [facility];  Service: Orthopedics;  Laterality: Right;  . Shoulder open rotator cuff repair Right 10/12/2014    Procedure: OPEN ROTATOR CUFF REPAIR SHOULDER;  Surgeon: Vickki Hearing, MD;  Location: [facility];  Service: Orthopedics;  Laterality: Right;  . Flexor tenotomy Right 10/12/2014    Procedure: BICEPS TENOTOMY;  Surgeon: Vickki Hearing, MD;  Location: [facility];  Service: Orthopedics;  Laterality: Right;    Family History  Problem Relation Age of Onset  . COPD Mother   . Arthritis Mother   . Diabetes Father   . Heart disease Father   . Hypertension Brother   . Diabetes Maternal Grandmother   . Diabetes Paternal Grandmother   . Dementia Paternal Grandfather    Social History:  reports that she has been smoking Cigarettes.  She has a 7.5 pack-year smoking history. She has never used smokeless tobacco. She reports that she does not drink  alcohol or use illicit drugs.  Allergies:  Allergies  Allergen Reactions  . Codeine Itching  . Sulfa Antibiotics Itching and Swelling    No prescriptions prior to admission    No results found for this or any previous visit (from the past 48 hour(s)). No results found.  Review of Systems  All other systems reviewed and are negative.   Height 5\' 9"  (1.753 m), weight 104.781 kg (231 lb), last menstrual period 10/18/2015. Physical Exam   Constitutional:  WD, WN, NAD HEENT:  NCAT, EOMI Neuro/Psych:  Alert & oriented to person, place, and time; appropriate mood & affect Lymphatic:  No generalized UE edema or lymphadenopathy Extremities / MSK:  Both UE are normal with respect to appearance, ranges of motion, joint stability, muscle strength/tone, sensation, & perfusion except as otherwise noted:  She describes numbness and tingling in the ring and small fingers without any provocative maneuvers.  Monofilaments today measure 2.83 in the median distribution, 3.61 with appropriate ring finger splitting.  The nerve appears unstable with cyclical flexion.  Labs / Xrays:  No radiographic studies obtained today.  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  She would like to proceed promptly, so we can schedule this as soon as authorization is received via her workers Conservation officer, naturecompensation adjustor.  The details of the operative procedure were discussed with the patient.  Questions were invited and answered.  In addition to the goal of the procedure, the risks of the procedure to include but not limited to bleeding; infection; damage to the nerves or blood vessels that could result in bleeding, numbness, weakness, chronic pain, and the need for additional procedures; stiffness; the need for revision surgery; and anesthetic risks, were reviewed.  No specific outcome was guaranteed or implied.  Informed consent was obtained.  Anitra Doxtater A. 11/08/2015, 7:00 PM

## 2015-11-13 ENCOUNTER — Encounter (HOSPITAL_BASED_OUTPATIENT_CLINIC_OR_DEPARTMENT_OTHER)
Admission: RE | Disposition: A | Discharge status: Home / Self Care | Payer: Self-pay | Source: Ambulatory Visit | Attending: Orthopedic Surgery

## 2015-11-13 ENCOUNTER — Ambulatory Visit (HOSPITAL_BASED_OUTPATIENT_CLINIC_OR_DEPARTMENT_OTHER): Payer: Worker's Compensation | Admitting: Anesthesiology

## 2015-11-13 ENCOUNTER — Encounter (HOSPITAL_BASED_OUTPATIENT_CLINIC_OR_DEPARTMENT_OTHER): Payer: Self-pay

## 2015-11-13 ENCOUNTER — Ambulatory Visit (HOSPITAL_BASED_OUTPATIENT_CLINIC_OR_DEPARTMENT_OTHER)
Admission: RE | Admit: 2015-11-13 | Discharge: 2015-11-13 | Disposition: A | Discharge status: Home / Self Care | Payer: Worker's Compensation | Source: Ambulatory Visit | Attending: Orthopedic Surgery | Admitting: Orthopedic Surgery

## 2015-11-13 DIAGNOSIS — G5621 Lesion of ulnar nerve, right upper limb: Secondary | ICD-10-CM | POA: Insufficient documentation

## 2015-11-13 DIAGNOSIS — K219 Gastro-esophageal reflux disease without esophagitis: Secondary | ICD-10-CM | POA: Insufficient documentation

## 2015-11-13 DIAGNOSIS — Z885 Allergy status to narcotic agent status: Secondary | ICD-10-CM | POA: Diagnosis not present

## 2015-11-13 DIAGNOSIS — F17200 Nicotine dependence, unspecified, uncomplicated: Secondary | ICD-10-CM | POA: Diagnosis not present

## 2015-11-13 DIAGNOSIS — Z882 Allergy status to sulfonamides status: Secondary | ICD-10-CM | POA: Insufficient documentation

## 2015-11-13 DIAGNOSIS — E669 Obesity, unspecified: Secondary | ICD-10-CM | POA: Diagnosis not present

## 2015-11-13 DIAGNOSIS — Z9049 Acquired absence of other specified parts of digestive tract: Secondary | ICD-10-CM | POA: Diagnosis not present

## 2015-11-13 DIAGNOSIS — Z6833 Body mass index (BMI) 33.0-33.9, adult: Secondary | ICD-10-CM | POA: Insufficient documentation

## 2015-11-13 HISTORY — PX: ULNAR NERVE TRANSPOSITION: SHX2595

## 2015-11-13 SURGERY — ULNAR NERVE DECOMPRESSION/TRANSPOSITION
Anesthesia: General | Site: Arm Lower | Laterality: Right

## 2015-11-13 MED ORDER — ONDANSETRON HCL 4 MG/2ML IJ SOLN
INTRAMUSCULAR | Status: AC
  Filled 2015-11-13: qty 2

## 2015-11-13 MED ORDER — FENTANYL CITRATE (PF) 100 MCG/2ML IJ SOLN
INTRAMUSCULAR | Status: AC
  Filled 2015-11-13: qty 2

## 2015-11-13 MED ORDER — MIDAZOLAM HCL 2 MG/2ML IJ SOLN: 1.0000 mg | INTRAMUSCULAR | Status: DC | PRN

## 2015-11-13 MED ORDER — EPHEDRINE SULFATE 50 MG/ML IJ SOLN
INTRAMUSCULAR | Status: AC
  Filled 2015-11-13: qty 1

## 2015-11-13 MED ORDER — FENTANYL CITRATE (PF) 100 MCG/2ML IJ SOLN: 50.0000 ug | INTRAMUSCULAR | Status: DC | PRN

## 2015-11-13 MED ORDER — GLYCOPYRROLATE 0.2 MG/ML IJ SOLN: 0.2000 mg | Freq: Once | INTRAMUSCULAR | Status: DC | PRN

## 2015-11-13 MED ORDER — LIDOCAINE HCL (CARDIAC) 20 MG/ML IV SOLN
INTRAVENOUS | Status: DC | PRN
  Administered 2015-11-13: 100 mg via INTRAVENOUS

## 2015-11-13 MED ORDER — BUPIVACAINE-EPINEPHRINE (PF) 0.5% -1:200000 IJ SOLN
INTRAMUSCULAR | Status: AC
  Filled 2015-11-13: qty 60

## 2015-11-13 MED ORDER — GLYCOPYRROLATE 0.2 MG/ML IJ SOLN
INTRAMUSCULAR | Status: AC
  Filled 2015-11-13: qty 1

## 2015-11-13 MED ORDER — LACTATED RINGERS IV SOLN
INTRAVENOUS | Status: DC
  Administered 2015-11-13: 10:00:00 via INTRAVENOUS

## 2015-11-13 MED ORDER — BUPIVACAINE-EPINEPHRINE 0.5% -1:200000 IJ SOLN
INTRAMUSCULAR | Status: DC | PRN
  Administered 2015-11-13: 20 mL

## 2015-11-13 MED ORDER — LACTATED RINGERS IV SOLN: INTRAVENOUS | Status: DC

## 2015-11-13 MED ORDER — OXYCODONE HCL 5 MG PO TABS
ORAL_TABLET | ORAL | Status: AC
  Filled 2015-11-13: qty 1

## 2015-11-13 MED ORDER — DEXAMETHASONE SODIUM PHOSPHATE 4 MG/ML IJ SOLN
INTRAMUSCULAR | Status: DC | PRN
  Administered 2015-11-13: 4 mg via INTRAVENOUS

## 2015-11-13 MED ORDER — MIDAZOLAM HCL 2 MG/2ML IJ SOLN
INTRAMUSCULAR | Status: AC
  Filled 2015-11-13: qty 2

## 2015-11-13 MED ORDER — OXYCODONE-ACETAMINOPHEN 5-325 MG PO TABS: 1.0000 | ORAL_TABLET | Freq: Four times a day (QID) | ORAL | Status: DC | PRN

## 2015-11-13 MED ORDER — PROPOFOL 10 MG/ML IV BOLUS
INTRAVENOUS | Status: DC | PRN
  Administered 2015-11-13: 20 mg via INTRAVENOUS
  Administered 2015-11-13: 200 mg via INTRAVENOUS

## 2015-11-13 MED ORDER — FENTANYL CITRATE (PF) 100 MCG/2ML IJ SOLN
INTRAMUSCULAR | Status: DC | PRN
  Administered 2015-11-13: 100 ug via INTRAVENOUS
  Administered 2015-11-13 (×4): 50 ug via INTRAVENOUS

## 2015-11-13 MED ORDER — OXYCODONE HCL 5 MG PO TABS
5.0000 mg | ORAL_TABLET | Freq: Once | ORAL | Status: AC
  Administered 2015-11-13: 5 mg via ORAL

## 2015-11-13 MED ORDER — SCOPOLAMINE 1 MG/3DAYS TD PT72: 1.0000 | MEDICATED_PATCH | Freq: Once | TRANSDERMAL | Status: DC | PRN

## 2015-11-13 MED ORDER — FENTANYL CITRATE (PF) 100 MCG/2ML IJ SOLN
25.0000 ug | INTRAMUSCULAR | Status: DC | PRN
  Administered 2015-11-13 (×3): 50 ug via INTRAVENOUS

## 2015-11-13 MED ORDER — PROPOFOL 10 MG/ML IV BOLUS
INTRAVENOUS | Status: AC
  Filled 2015-11-13: qty 40

## 2015-11-13 MED ORDER — ONDANSETRON HCL 4 MG/2ML IJ SOLN
INTRAMUSCULAR | Status: DC | PRN
  Administered 2015-11-13: 4 mg via INTRAVENOUS

## 2015-11-13 MED ORDER — CEFAZOLIN SODIUM-DEXTROSE 2-3 GM-% IV SOLR
2.0000 g | INTRAVENOUS | Status: AC
  Administered 2015-11-13: 2 g via INTRAVENOUS

## 2015-11-13 MED ORDER — PHENYLEPHRINE HCL 10 MG/ML IJ SOLN
INTRAMUSCULAR | Status: AC
  Filled 2015-11-13: qty 1

## 2015-11-13 MED ORDER — DEXAMETHASONE SODIUM PHOSPHATE 10 MG/ML IJ SOLN
INTRAMUSCULAR | Status: AC
  Filled 2015-11-13: qty 1

## 2015-11-13 MED ORDER — LACTATED RINGERS IV SOLN
INTRAVENOUS | Status: DC | PRN
  Administered 2015-11-13: 09:00:00 via INTRAVENOUS

## 2015-11-13 MED ORDER — CEFAZOLIN SODIUM-DEXTROSE 2-3 GM-% IV SOLR
INTRAVENOUS | Status: AC
  Filled 2015-11-13: qty 50

## 2015-11-13 MED ORDER — EPHEDRINE SULFATE 50 MG/ML IJ SOLN
INTRAMUSCULAR | Status: DC | PRN
  Administered 2015-11-13: 10 mg via INTRAVENOUS

## 2015-11-13 MED ORDER — LIDOCAINE HCL (PF) 1 % IJ SOLN
INTRAMUSCULAR | Status: AC
  Filled 2015-11-13: qty 60

## 2015-11-13 MED ORDER — PROMETHAZINE HCL 25 MG/ML IJ SOLN: 6.2500 mg | INTRAMUSCULAR | Status: DC | PRN

## 2015-11-13 MED ORDER — ATROPINE SULFATE 0.4 MG/ML IJ SOLN
INTRAMUSCULAR | Status: AC
  Filled 2015-11-13: qty 1

## 2015-11-13 MED ORDER — MIDAZOLAM HCL 5 MG/5ML IJ SOLN
INTRAMUSCULAR | Status: DC | PRN
  Administered 2015-11-13: 2 mg via INTRAVENOUS

## 2015-11-13 MED ORDER — SUCCINYLCHOLINE CHLORIDE 20 MG/ML IJ SOLN
INTRAMUSCULAR | Status: AC
  Filled 2015-11-13: qty 1

## 2015-11-13 MED ORDER — LIDOCAINE HCL (CARDIAC) 20 MG/ML IV SOLN
INTRAVENOUS | Status: AC
  Filled 2015-11-13: qty 5

## 2015-11-13 SURGICAL SUPPLY — 56 items
BENZOIN TINCTURE PRP APPL 2/3 (GAUZE/BANDAGES/DRESSINGS) ×3 IMPLANT
BLADE MINI RND TIP GREEN BEAV (BLADE) IMPLANT
BLADE SURG 15 STRL LF DISP TIS (BLADE) ×1 IMPLANT
BLADE SURG 15 STRL SS (BLADE) ×2
BNDG COHESIVE 4X5 TAN STRL (GAUZE/BANDAGES/DRESSINGS) ×3 IMPLANT
BNDG ESMARK 4X9 LF (GAUZE/BANDAGES/DRESSINGS) ×3 IMPLANT
BNDG GAUZE ELAST 4 BULKY (GAUZE/BANDAGES/DRESSINGS) ×6 IMPLANT
CHLORAPREP W/TINT 26ML (MISCELLANEOUS) ×3 IMPLANT
CLOSURE WOUND 1/2 X4 (GAUZE/BANDAGES/DRESSINGS) ×1
CORDS BIPOLAR (ELECTRODE) ×3 IMPLANT
COVER BACK TABLE 60X90IN (DRAPES) ×3 IMPLANT
COVER MAYO STAND STRL (DRAPES) ×3 IMPLANT
CUFF TOURN SGL LL 18 NRW (TOURNIQUET CUFF) IMPLANT
CUFF TOURNIQUET SINGLE 24IN (TOURNIQUET CUFF) ×3 IMPLANT
DRAIN PENROSE 1/2X12 LTX STRL (WOUND CARE) IMPLANT
DRAIN PENROSE 1/4X12 LTX STRL (WOUND CARE) IMPLANT
DRAPE EXTREMITY T 121X128X90 (DRAPE) ×3 IMPLANT
DRAPE SURG 17X23 STRL (DRAPES) IMPLANT
DRAPE U-SHAPE 47X51 STRL (DRAPES) ×3 IMPLANT
DRSG ADAPTIC 3X8 NADH LF (GAUZE/BANDAGES/DRESSINGS) IMPLANT
DRSG EMULSION OIL 3X3 NADH (GAUZE/BANDAGES/DRESSINGS) ×6 IMPLANT
GAUZE SPONGE 4X4 12PLY STRL (GAUZE/BANDAGES/DRESSINGS) ×3 IMPLANT
GLOVE BIO SURGEON STRL SZ7.5 (GLOVE) ×3 IMPLANT
GLOVE BIOGEL PI IND STRL 7.0 (GLOVE) ×2 IMPLANT
GLOVE BIOGEL PI IND STRL 8 (GLOVE) ×1 IMPLANT
GLOVE BIOGEL PI INDICATOR 7.0 (GLOVE) ×4
GLOVE BIOGEL PI INDICATOR 8 (GLOVE) ×2
GLOVE ECLIPSE 6.5 STRL STRAW (GLOVE) ×6 IMPLANT
GOWN STRL REUS W/ TWL LRG LVL3 (GOWN DISPOSABLE) ×2 IMPLANT
GOWN STRL REUS W/TWL LRG LVL3 (GOWN DISPOSABLE) ×4
GOWN STRL REUS W/TWL XL LVL3 (GOWN DISPOSABLE) ×3 IMPLANT
LOOP VESSEL MAXI BLUE (MISCELLANEOUS) IMPLANT
NEEDLE HYPO 25X1 1.5 SAFETY (NEEDLE) ×3 IMPLANT
NS IRRIG 1000ML POUR BTL (IV SOLUTION) ×3 IMPLANT
PACK BASIN DAY SURGERY FS (CUSTOM PROCEDURE TRAY) ×3 IMPLANT
PADDING CAST ABS 4INX4YD NS (CAST SUPPLIES) ×4
PADDING CAST ABS COTTON 4X4 ST (CAST SUPPLIES) ×2 IMPLANT
RUBBERBAND STERILE (MISCELLANEOUS) IMPLANT
SLING ARM FOAM STRAP LRG (SOFTGOODS) IMPLANT
SLING ARM MED ADULT FOAM STRAP (SOFTGOODS) IMPLANT
SLING ARM XL FOAM STRAP (SOFTGOODS) IMPLANT
STOCKINETTE 6  STRL (DRAPES) ×2
STOCKINETTE 6 STRL (DRAPES) ×1 IMPLANT
STRIP CLOSURE SKIN 1/2X4 (GAUZE/BANDAGES/DRESSINGS) ×2 IMPLANT
SUT ETHILON 8 0 BV130 4 (SUTURE) IMPLANT
SUT VIC AB 0 SH 27 (SUTURE) IMPLANT
SUT VIC AB 2-0 SH 27 (SUTURE) ×2
SUT VIC AB 2-0 SH 27XBRD (SUTURE) ×1 IMPLANT
SUT VIC AB 3-0 SH 27 (SUTURE)
SUT VIC AB 3-0 SH 27X BRD (SUTURE) IMPLANT
SUT VICRYL RAPIDE 4/0 PS 2 (SUTURE) ×3 IMPLANT
SYR BULB 3OZ (MISCELLANEOUS) ×3 IMPLANT
SYRINGE 10CC LL (SYRINGE) ×3 IMPLANT
TOWEL OR 17X24 6PK STRL BLUE (TOWEL DISPOSABLE) ×6 IMPLANT
TOWEL OR NON WOVEN STRL DISP B (DISPOSABLE) ×3 IMPLANT
UNDERPAD 30X30 (UNDERPADS AND DIAPERS) ×3 IMPLANT

## 2015-11-13 NOTE — Op Note (Signed)
11/13/2015  9:43 AM  PATIENT:  Alexandria Mejia  44 y.o. 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 Janee Mornhompson, MD  PHYSICIAN ASSISTANT: Danielle RankinKirsten Schrader, OPA-C  ANESTHESIA:  general  SPECIMENS:  None  DRAINS:   None  EBL:  less than 50 mL  PREOPERATIVE INDICATIONS:  Alexandria Mejia is a  44 y.o. female with history of right ulnar neuritis and clinical instability of the nerve at the elbow  The risks benefits and alternatives were discussed with the patient preoperatively including but not limited to the risks of infection, bleeding, nerve injury, cardiopulmonary complications, the need for revision surgery, among others, and the patient verbalized understanding and consented to proceed.  OPERATIVE IMPLANTS: None  OPERATIVE PROCEDURE:  After receiving prophylactic antibiotics, the patient was escorted to the operative theatre and placed in a supine position.  General anesthesia was administered. A surgical "time-out" was performed during which the planned procedure, proposed operative site, and the correct patient identity were compared to the operative consent and agreement confirmed by the circulating nurse according to current facility policy.  The exposed skin was prepped with Chloraprep and draped in the usual sterile fashion.  A tourniquet was applied sterilely. The limb was exsanguinated with an Esmarch bandage and the tourniquet inflated to approximately 100mmHg higher than systolic BP.  A curvilinear incision was made over the course of the nerve at the level of the elbow medially. The skin was incised sharply and subcutaneous tissues were dissected with blunt spreading dissection, elevating full-thickness flap. Care was taken to look for and protect and preserve any  crossing cutaneous nerves from anterior posterior about the elbow. The nerve was identified and its instability appreciated. The nerve was dissected free, to include division of the deep and superficial fascia of the FCU as well as dissecting it free into the midportion of the brachium. This required a separate accessory incision that was made transversely, respecting the patient's tattoo. The nerve was then freed enough into the FCU, dissecting free branches to the FCU so that it could be mobilized sufficiently distally to allow for transposition without kinking. An anterior subcutaneous pocket was created. The medial intermuscular septum was excised for several centimeters to allow for transposition of the nerve without folding over the intermuscular septum. The flexor pronator fascia was elevated in a flap to allow for creation of a barrier to the nerves of the posterior subluxation could not occur. The wounds were copiously irrigated and the fascial flap was reapproximated to the superficial fascia of the skin flap at that level, creating a barrier to posterior subluxation loss still having capacious space for the nerve without kinking. The nerve was dissected free sufficiently proximally so that its course into the anterior portion of the elbow could be smooth and not sharp or kinked. Tourniquet was released some additional hemostasis was obtained and then the skin was closed with 2-0 Vicryl deep dermal buried sutures and a running 4-0 Vicryl Rapide subcuticular suture with benzoin and Steri-Strips. Percent Marcaine with epinephrine was instilled in and around the regions of the skin incisions and a bulky dressing was applied without any plaster component. She was placed into a sling and awakened. She was taken to the recovery room in stable condition breathing spontaneously.  DISPOSITION: She'll be discharged home with typical instructions returning in  10-15 days for reevaluation. No work in the interim.

## 2015-11-13 NOTE — Interval H&P Note (Signed)
History and Physical Interval Note:  11/13/2015 9:43 AM  Alexandria PeltMichelle M Debruler  has presented today for surgery, with the diagnosis of RIGHT ULNAR NEUROPLASTY G56.21  The various methods of treatment have been discussed with the patient and family. After consideration of risks, benefits and other options for treatment, the patient has consented to  Procedure(s): RIGHT ULNAR NERVE TRANSPOSITION (Right) as a surgical intervention .  The patient's history has been reviewed, patient examined, no change in status, stable for surgery.  I have reviewed the patient's chart and labs.  Questions were answered to the patient's satisfaction.     Brown Dunlap A.

## 2015-11-13 NOTE — Anesthesia Preprocedure Evaluation (Addendum)
Anesthesia Evaluation  Patient identified by MRN, date of birth, ID band Patient awake    Reviewed: Allergy & Precautions, NPO status , Patient's Chart, lab work & pertinent test results  History of Anesthesia Complications (+) PROLONGED EMERGENCE and history of anesthetic complications  Airway Mallampati: II  TM Distance: >3 FB Neck ROM: Full    Dental  (+) Teeth Intact, Dental Advisory Given   Pulmonary Current Smoker (0.5PPD),    Pulmonary exam normal breath sounds clear to auscultation       Cardiovascular Exercise Tolerance: Good (-) hypertension(-) angina(-) CAD and (-) CHF negative cardio ROS Normal cardiovascular exam Rhythm:Regular Rate:Normal     Neuro/Psych  Headaches, negative psych ROS   GI/Hepatic Neg liver ROS, GERD  Medicated and Controlled,  Endo/Other  Obesity   Renal/GU negative Renal ROS     Musculoskeletal negative musculoskeletal ROS (+)   Abdominal   Peds  Hematology negative hematology ROS (+)   Anesthesia Other Findings Day of surgery medications reviewed with the patient.  Reproductive/Obstetrics negative OB ROS                           Anesthesia Physical Anesthesia Plan  ASA: II  Anesthesia Plan: General   Post-op Pain Management:    Induction: Intravenous  Airway Management Planned: LMA  Additional Equipment:   Intra-op Plan:   Post-operative Plan: Extubation in OR  Informed Consent: I have reviewed the patients History and Physical, chart, labs and discussed the procedure including the risks, benefits and alternatives for the proposed anesthesia with the patient or authorized representative who has indicated his/her understanding and acceptance.   Dental advisory given  Plan Discussed with: CRNA  Anesthesia Plan Comments: (Risks/benefits of general anesthesia discussed with patient including risk of damage to teeth, lips, gum, and tongue,  nausea/vomiting, allergic reactions to medications, and the possibility of heart attack, stroke and death.  All patient questions answered.  Patient wishes to proceed.)       Anesthesia Quick Evaluation

## 2015-11-13 NOTE — Discharge Instructions (Addendum)
Discharge Instructions   You have a light dressing on your hand.  You may begin gentle motion of your fingers and hand immediately, but you should not do any heavy lifting or gripping.  Please also work on bending and straightening your elbow and wrist to the extent that you can within the bandages. Elevate your hand to reduce pain & swelling of the digits.  Ice over the operative site may be helpful to reduce pain & swelling.  DO NOT USE HEAT. Pain medicine has been prescribed for you.  Use your medicine as needed over the first 48 hours, and then you can begin to taper your use. You may use Tylenol in place of your prescribed pain medication, but not IN ADDITION to it. Leave the dressing in place until the third day after your surgery and then remove it, leaving it open to air.  After the bandage has been removed you may shower, regularly washing the incision and letting the water run over it, but not submerging it (no swimming, soaking it in dishwater, etc.) You may drive a car when you are off of prescription pain medications and can safely control your vehicle with both hands. We will address whether therapy will be required or not when you return to the office. You may have already made your follow-up appointment when we completed your preop visit.  If not, please call our office today or the next business day to make your return appointment for 10-15 days after surgery.   Please call [phone] during normal business hours or [phone] after hours for any problems. Including the following:  - excessive redness of the incisions - drainage for more than 4 days - fever of more than 101.5 F  *Please note that pain medications will not be refilled after hours or on weekends.  WORK STATUS: This patient will be out of work until she is seen for her return appointment in approximately 10-15 days     Post Anesthesia Home Care Instructions  Activity: Get plenty of rest for the  remainder of the day. A responsible adult should stay with you for 24 hours following the procedure.  For the next 24 hours, DO NOT: -Drive a car -Advertising copywriterperate machinery -Drink alcoholic beverages -Take any medication unless instructed by your physician -Make any legal decisions or sign important papers.  Meals: Start with liquid foods such as gelatin or soup. Progress to regular foods as tolerated. Avoid greasy, spicy, heavy foods. If nausea and/or vomiting occur, drink only clear liquids until the nausea and/or vomiting subsides. Call your physician if vomiting continues.  Special Instructions/Symptoms: Your throat may feel dry or sore from the anesthesia or the breathing tube placed in your throat during surgery. If this causes discomfort, gargle with warm salt water. The discomfort should disappear within 24 hours.  If you had a scopolamine patch placed behind your ear for the management of post- operative nausea and/or vomiting:  1. The medication in the patch is effective for 72 hours, after which it should be removed.  Wrap patch in a tissue and discard in the trash. Wash hands thoroughly with soap and water. 2. You may remove the patch earlier than 72 hours if you experience unpleasant side effects which may include dry mouth, dizziness or visual disturbances. 3. Avoid touching the patch. Wash your hands with soap and water after contact with the patch.

## 2015-11-13 NOTE — Anesthesia Procedure Notes (Signed)
Procedure Name: LMA Insertion Date/Time: 11/13/2015 9:51 AM Performed by: Genevieve NorlanderLINKA, Alexandria Mejia Pre-anesthesia Checklist: Patient identified, Emergency Drugs available, Suction available, Patient being monitored and Timeout performed Patient Re-evaluated:Patient Re-evaluated prior to inductionOxygen Delivery Method: Circle System Utilized Preoxygenation: Pre-oxygenation with 100% oxygen Intubation Type: IV induction Ventilation: Mask ventilation without difficulty LMA: LMA inserted LMA Size: 5.0 Number of attempts: 1 Airway Equipment and Method: Bite block Placement Confirmation: positive ETCO2 Tube secured with: Tape Dental Injury: Teeth and Oropharynx as per pre-operative assessment

## 2015-11-13 NOTE — Anesthesia Postprocedure Evaluation (Signed)
Anesthesia Post Note  Patient: Alexandria Mejia  Procedure(s) Performed: Procedure(s) (LRB): RIGHT ULNAR NERVE TRANSPOSITION (Right)  Patient location during evaluation: PACU Anesthesia Type: General Level of consciousness: awake and alert Pain management: pain level controlled Vital Signs Assessment: post-procedure vital signs reviewed and stable Respiratory status: spontaneous breathing, nonlabored ventilation, respiratory function stable and patient connected to nasal cannula oxygen Cardiovascular status: blood pressure returned to baseline and stable Postop Assessment: No signs of nausea or vomiting Anesthetic complications: no    Last Vitals:  Filed Vitals:   11/13/15 1145 11/13/15 1155  BP: 124/80   Pulse: 63 65  Temp:    Resp: 15     Last Pain:  Filed Vitals:   11/13/15 1156  PainSc: 3                  Cecile HearingStephen Edward Turk

## 2015-11-13 NOTE — Transfer of Care (Signed)
Immediate Anesthesia Transfer of Care Note  Patient: Alexandria PeltMichelle M Vandermeulen  Procedure(s) Performed: Procedure(s): RIGHT ULNAR NERVE TRANSPOSITION (Right)  Patient Location: PACU  Anesthesia Type:General  Level of Consciousness: awake and patient cooperative  Airway & Oxygen Therapy: Patient Spontanous Breathing and Patient connected to face mask oxygen  Post-op Assessment: Report given to RN and Post -op Vital signs reviewed and stable  Post vital signs: Reviewed and stable  Last Vitals:  Filed Vitals:   11/13/15 0901 11/13/15 1110  BP: 106/53 118/83  Pulse: 59 73  Temp: 36.8 C   Resp: 20     Complications: No apparent anesthesia complications

## 2015-11-14 ENCOUNTER — Encounter (HOSPITAL_BASED_OUTPATIENT_CLINIC_OR_DEPARTMENT_OTHER): Payer: Self-pay | Admitting: Orthopedic Surgery

## 2015-11-16 ENCOUNTER — Other Ambulatory Visit: Payer: Self-pay | Admitting: *Deleted

## 2015-11-16 ENCOUNTER — Telehealth: Payer: Self-pay | Admitting: Orthopedic Surgery

## 2015-11-16 DIAGNOSIS — M75101 Unspecified rotator cuff tear or rupture of right shoulder, not specified as traumatic: Secondary | ICD-10-CM

## 2015-11-16 NOTE — Telephone Encounter (Signed)
Patient called to request refill on medication: HYDROcodone-acetaminophen (NORCO) 10-325 MG tablet [[id number]][[id number]]  - patient's ph# is [phone]

## 2015-11-16 NOTE — Telephone Encounter (Signed)
Received OXY 5, 1-2 TABS Q 6 HOURS PRN, #60 ON 11/13/15 FROM DR Janee MornHOMPSON

## 2015-11-16 NOTE — Telephone Encounter (Signed)
CAN REFILL DEC 12

## 2015-11-21 NOTE — Telephone Encounter (Signed)
Patient called stating she needs her hydrocodone refilled, I made her aware it will not be able to be refilled until 11/27/15. Patient states her November bottle has the 16th, patient is not sure why she cannot refill it. Please advise

## 2015-11-21 NOTE — Telephone Encounter (Signed)
Patient aware.

## 2015-11-27 ENCOUNTER — Other Ambulatory Visit: Payer: Self-pay | Admitting: *Deleted

## 2015-11-27 DIAGNOSIS — M75101 Unspecified rotator cuff tear or rupture of right shoulder, not specified as traumatic: Secondary | ICD-10-CM

## 2015-11-27 MED ORDER — HYDROCODONE-ACETAMINOPHEN 10-325 MG PO TABS: 1.0000 | ORAL_TABLET | ORAL | Status: DC | PRN

## 2015-11-27 NOTE — Telephone Encounter (Signed)
Patient called back today, 11/27/15, as per previous note, 11/21/15, for the Hydrocodone refill request, to check if ready?  Her ph# is [phone].

## 2015-11-28 NOTE — Telephone Encounter (Signed)
Patient picked up Rx

## 2015-11-28 NOTE — Telephone Encounter (Signed)
Patient is aware medication is ready

## 2015-11-28 NOTE — Telephone Encounter (Signed)
Prescription available 

## 2015-12-04 ENCOUNTER — Ambulatory Visit (INDEPENDENT_AMBULATORY_CARE_PROVIDER_SITE_OTHER): Payer: Worker's Compensation | Admitting: Orthopedic Surgery

## 2015-12-04 ENCOUNTER — Encounter: Payer: Self-pay | Admitting: Orthopedic Surgery

## 2015-12-04 VITALS — BP 116/77 | Ht 69.0 in | Wt 226.0 lb

## 2015-12-04 DIAGNOSIS — G5621 Lesion of ulnar nerve, right upper limb: Secondary | ICD-10-CM | POA: Diagnosis not present

## 2015-12-04 DIAGNOSIS — Z9889 Other specified postprocedural states: Secondary | ICD-10-CM

## 2015-12-04 DIAGNOSIS — G894 Chronic pain syndrome: Secondary | ICD-10-CM | POA: Diagnosis not present

## 2015-12-04 NOTE — Progress Notes (Signed)
Follow-up visit  The patient had revision right rotator cuff repair in 10/12/2014 at a postop MRI when she failed to progress in physical therapy which showed no recurrence of tear just degeneration of the tendons  She is an ongoing problems with her ulnar nerve and recently had an ulnar nerve release plus or minus transposition  She comes in with her continued stiffness and weakness in the right shoulder with limited range of motion and weakness as follows  External rotation with her arm at her side 30 passive elevation 120 passive abduction 90  Once her ulnar nerve situation has run its course and she's been released we will give her an MMI. She is not looking like she will be able to be weaned from narcotic medication and will need chronic pain management which I'm happy to do until the MMI is done; She will need permanent restrictions for lifting and the amount of weight and the amount of height that she will be able to lift any object with her right arm  Follow-up in a month continue Robaxin Prilosec ibuprofen and Norco 10 mg

## 2015-12-12 ENCOUNTER — Other Ambulatory Visit: Payer: Self-pay | Admitting: *Deleted

## 2015-12-12 DIAGNOSIS — M75101 Unspecified rotator cuff tear or rupture of right shoulder, not specified as traumatic: Secondary | ICD-10-CM

## 2015-12-12 MED ORDER — HYDROCODONE-ACETAMINOPHEN 10-325 MG PO TABS: 1.0000 | ORAL_TABLET | ORAL | Status: DC | PRN

## 2015-12-12 NOTE — Telephone Encounter (Signed)
Patient called requesting her hydrocodone to be refilled and patient is requesting a muscle relaxer to be called in.

## 2015-12-12 NOTE — Telephone Encounter (Signed)
Patient's contact number is [phone]

## 2015-12-13 NOTE — Telephone Encounter (Signed)
Patient called back; aware prescription ready for pick-up.

## 2015-12-13 NOTE — Telephone Encounter (Signed)
Prescription available, called patient, no answer 

## 2015-12-13 NOTE — Telephone Encounter (Signed)
Prescription picked up. 

## 2015-12-28 ENCOUNTER — Other Ambulatory Visit: Payer: Self-pay | Admitting: *Deleted

## 2015-12-28 ENCOUNTER — Telehealth: Payer: Self-pay | Admitting: Orthopedic Surgery

## 2015-12-28 DIAGNOSIS — M75101 Unspecified rotator cuff tear or rupture of right shoulder, not specified as traumatic: Secondary | ICD-10-CM

## 2015-12-28 MED ORDER — HYDROCODONE-ACETAMINOPHEN 10-325 MG PO TABS: 1.0000 | ORAL_TABLET | ORAL | Status: DC | PRN

## 2015-12-28 NOTE — Telephone Encounter (Signed)
Prescription available, patient aware  

## 2015-12-28 NOTE — Telephone Encounter (Signed)
Patient called to request refill of medication: HYDROcodone-acetaminophen (NORCO) 10-325 MG tablet [[id number]][[id number]] - ph# [phone].

## 2016-01-04 ENCOUNTER — Ambulatory Visit (INDEPENDENT_AMBULATORY_CARE_PROVIDER_SITE_OTHER): Payer: Worker's Compensation | Admitting: Orthopedic Surgery

## 2016-01-04 ENCOUNTER — Encounter: Payer: Self-pay | Admitting: Orthopedic Surgery

## 2016-01-04 VITALS — BP 117/82 | Ht 69.0 in | Wt 226.0 lb

## 2016-01-04 DIAGNOSIS — M75101 Unspecified rotator cuff tear or rupture of right shoulder, not specified as traumatic: Secondary | ICD-10-CM | POA: Diagnosis not present

## 2016-01-04 NOTE — Progress Notes (Signed)
Follow-up visit  Status post revision right rotator cuff repair in October 2015 and had a postop MRI after that which showed no recurrence of tear just degeneration of tendon.   status post ulnar nerve transposition   Current meds are Robaxin Prilosec ibuprofen Norco 10   No change in range of motion see no date 12/04/2015   Return 1 month

## 2016-01-15 ENCOUNTER — Other Ambulatory Visit: Payer: Self-pay | Admitting: *Deleted

## 2016-01-15 ENCOUNTER — Telehealth: Payer: Self-pay | Admitting: Orthopedic Surgery

## 2016-01-15 DIAGNOSIS — M75101 Unspecified rotator cuff tear or rupture of right shoulder, not specified as traumatic: Secondary | ICD-10-CM

## 2016-01-15 MED ORDER — HYDROCODONE-ACETAMINOPHEN 10-325 MG PO TABS: 1.0000 | ORAL_TABLET | ORAL | Status: DC | PRN

## 2016-01-17 NOTE — Telephone Encounter (Signed)
Prescription available,called patient, #disconnected

## 2016-01-17 NOTE — Telephone Encounter (Signed)
Patient picked up prescription 12/28/15

## 2016-01-17 NOTE — Telephone Encounter (Signed)
Patient called, came in and picked up prescription.

## 2016-01-31 ENCOUNTER — Other Ambulatory Visit: Payer: Self-pay | Admitting: *Deleted

## 2016-01-31 ENCOUNTER — Telehealth: Payer: Self-pay | Admitting: *Deleted

## 2016-01-31 DIAGNOSIS — M75101 Unspecified rotator cuff tear or rupture of right shoulder, not specified as traumatic: Secondary | ICD-10-CM

## 2016-01-31 MED ORDER — OXYCODONE-ACETAMINOPHEN 5-325 MG PO TABS: 1.0000 | ORAL_TABLET | Freq: Four times a day (QID) | ORAL | Status: DC | PRN

## 2016-01-31 MED ORDER — HYDROCODONE-ACETAMINOPHEN 10-325 MG PO TABS: 1.0000 | ORAL_TABLET | ORAL | Status: DC | PRN

## 2016-01-31 NOTE — Telephone Encounter (Signed)
Prescription available, patient aware  

## 2016-01-31 NOTE — Telephone Encounter (Signed)
Refill Not sure about the fill today   We will stay on normal protocol

## 2016-01-31 NOTE — Telephone Encounter (Signed)
Patient called requesting hydrocodone to be refilled, patient stated she would like to collect it today.  Please advise

## 2016-01-31 NOTE — Telephone Encounter (Signed)
Are you doing a wean?

## 2016-02-05 ENCOUNTER — Ambulatory Visit (INDEPENDENT_AMBULATORY_CARE_PROVIDER_SITE_OTHER): Payer: Worker's Compensation | Admitting: Orthopedic Surgery

## 2016-02-05 ENCOUNTER — Encounter: Payer: Self-pay | Admitting: Orthopedic Surgery

## 2016-02-05 VITALS — BP 119/77 | Ht 69.0 in | Wt 232.0 lb

## 2016-02-05 DIAGNOSIS — Z9889 Other specified postprocedural states: Secondary | ICD-10-CM

## 2016-02-05 DIAGNOSIS — G5621 Lesion of ulnar nerve, right upper limb: Secondary | ICD-10-CM

## 2016-02-05 DIAGNOSIS — G894 Chronic pain syndrome: Secondary | ICD-10-CM

## 2016-02-05 MED ORDER — OMEPRAZOLE 20 MG PO CPDR: 20.0000 mg | DELAYED_RELEASE_CAPSULE | Freq: Every day | ORAL | Status: DC

## 2016-02-05 NOTE — Progress Notes (Signed)
Follow-up visit  Chief Complaint  Patient presents with  . Follow-up    FOLLOW UP RT RCR, DOS 10/12/14    Encounter Diagnoses  Name Primary?  . H/O repair of right rotator cuff Yes  . Chronic pain syndrome   . Ulnar neuropathy of right upper extremity      Status post revision right rotator cuff repair in October 2016 and had a postop MRI after that which showed no recurrence of tear just degeneration of tendon.  status post ulnar nerve transposition with no numbness in the hand at this time just a small finger bothers her little bit  System review crepitance stiffness right shoulder  Exam.vs BP 119/77 mmHg  Ht  (1.753 m)  Wt 232 lb (105.235 kg)  BMI 34.24 kg/m2 Awake alert oriented 3 mood affect normal  Gait pattern normal  Stiffness right shoulder abduction 90 forward elevation 90  No swelling around the shoulder crepitance on range of motion weakness in the rotator cuff. Numbness areas around the shoulder have resolved including ulnar nerve symptoms. Shoulder remain stable.  Stiffness in the right shoulder will start pulley exercises again follow-up in a month  Current meds are Robaxin Prilosec ibuprofen Norco 10   Refill Prilosec  Continue to work follow-up one

## 2016-02-06 ENCOUNTER — Encounter: Payer: Self-pay | Admitting: Orthopedic Surgery

## 2016-02-15 ENCOUNTER — Telehealth: Payer: Self-pay | Admitting: Orthopedic Surgery

## 2016-02-15 ENCOUNTER — Telehealth: Payer: Self-pay | Admitting: Orthopaedic Surgery

## 2016-02-15 NOTE — Telephone Encounter (Signed)
Error - incorrect provider selected.  Please disregard.

## 2016-02-15 NOTE — Telephone Encounter (Signed)
Patient requesting refill of Hydrocodone  

## 2016-02-19 ENCOUNTER — Telehealth: Payer: Self-pay | Admitting: Orthopedic Surgery

## 2016-02-19 NOTE — Telephone Encounter (Signed)
Patient requesting refill of Hydrocodone 10/325mg   Qty 84 Tablets

## 2016-02-20 ENCOUNTER — Other Ambulatory Visit: Payer: Self-pay | Admitting: *Deleted

## 2016-02-20 DIAGNOSIS — M75101 Unspecified rotator cuff tear or rupture of right shoulder, not specified as traumatic: Secondary | ICD-10-CM

## 2016-02-20 MED ORDER — HYDROCODONE-ACETAMINOPHEN 10-325 MG PO TABS: 1.0000 | ORAL_TABLET | ORAL | Status: DC | PRN

## 2016-02-20 NOTE — Telephone Encounter (Signed)
Prescription available 

## 2016-03-04 ENCOUNTER — Encounter: Payer: Self-pay | Admitting: Orthopedic Surgery

## 2016-03-04 ENCOUNTER — Ambulatory Visit (INDEPENDENT_AMBULATORY_CARE_PROVIDER_SITE_OTHER): Payer: Worker's Compensation | Admitting: Orthopedic Surgery

## 2016-03-04 VITALS — BP 108/70 | Ht 69.0 in | Wt 232.0 lb

## 2016-03-04 DIAGNOSIS — G894 Chronic pain syndrome: Secondary | ICD-10-CM | POA: Diagnosis not present

## 2016-03-04 DIAGNOSIS — M75101 Unspecified rotator cuff tear or rupture of right shoulder, not specified as traumatic: Secondary | ICD-10-CM | POA: Diagnosis not present

## 2016-03-04 MED ORDER — HYDROCODONE-ACETAMINOPHEN 10-325 MG PO TABS: 1.0000 | ORAL_TABLET | ORAL | Status: DC | PRN

## 2016-03-04 NOTE — Progress Notes (Deleted)
Patient ID: Alexandria PeltMichelle M Mejia, female   DOB: [birth year], 45 y.o.   MRN: [id number]  HPI  Chief Complaint  Patient presents with  . Follow-up      FOLLOW UP RT RCR, DOS 10/12/14      Encounter Diagnoses  Name Primary?  . H/O repair of right rotator cuff Yes  . Chronic pain syndrome    . Ulnar neuropathy of right upper extremity       Status post revision right rotator cuff repair in October 2016 and had a postop MRI after that which showed no recurrence of tear just degeneration of tendon.  status post ulnar nerve transposition with no numbness in the hand at this time just a small finger bothers her little bit   ROS  There were no vitals taken for this visit.  ***    ASSESSMENT AND PLAN   ***

## 2016-03-04 NOTE — Progress Notes (Signed)
Patient ID: Alexandria PeltMichelle M Mejia, female   DOB: Apr 16, 1971, 45 y.o.   MRN: [id number]  Chief Complaint  Patient presents with  . Follow-up    1 month follow up Rt RCR, DOS 10/12/14, DOI 01/31/14    HPI   Chief Complaint  Patient presents with  . Follow-up    FOLLOW UP RT RCR, DOS 10/12/14    Encounter Diagnoses  Name Primary?  . H/O repair of right rotator cuff Yes  . Chronic pain syndrome   . Ulnar neuropathy of right upper extremity      Status post revision right rotator cuff repair in October 2016 and had a postop MRI after that which showed no recurrence of tear just degeneration of tendon.  status post ulnar nerve transposition with no numbness in the hand at this time just a small finger bothers her little bit       ROS   No new symptoms. The ulnar nerve symptoms seem to have come down except for mild elbow pain  She's having crepitance in the shoulder when she's using her pulley exercises  BP 108/70 mmHg  Ht 5\' 9"  (1.753 m)  Wt 232 lb (105.235 kg)  BMI 34.24 kg/m2  ASSESSMENT AND PLAN   Refill medication   Return 1 month  Maximal improvement expected October 2017

## 2016-03-20 ENCOUNTER — Encounter: Payer: Self-pay | Admitting: Orthopedic Surgery

## 2016-03-25 ENCOUNTER — Telehealth: Payer: Self-pay | Admitting: Orthopedic Surgery

## 2016-03-25 ENCOUNTER — Other Ambulatory Visit: Payer: Self-pay | Admitting: *Deleted

## 2016-03-25 DIAGNOSIS — M75101 Unspecified rotator cuff tear or rupture of right shoulder, not specified as traumatic: Secondary | ICD-10-CM

## 2016-03-25 NOTE — Telephone Encounter (Signed)
Patient requesting refill of Hydrocodone-Acetaminophen 10/325mg     Qty 84 Tablets Take 1 by mouth every 4 (four) hours as needed for (pain).

## 2016-03-25 NOTE — Telephone Encounter (Signed)
Routing to Dr Hilda LiasKeeling since Dr Romeo AppleHarrison is out of the office this week

## 2016-03-26 MED ORDER — HYDROCODONE-ACETAMINOPHEN 10-325 MG PO TABS: 1.0000 | ORAL_TABLET | ORAL | Status: DC | PRN

## 2016-03-26 NOTE — Telephone Encounter (Signed)
Rx done. 

## 2016-03-27 NOTE — Telephone Encounter (Signed)
Patient called to see if hydrocodone rx is ready for pick up.  Advised patient rx is ready.

## 2016-04-08 ENCOUNTER — Ambulatory Visit (INDEPENDENT_AMBULATORY_CARE_PROVIDER_SITE_OTHER): Payer: Worker's Compensation | Admitting: Orthopedic Surgery

## 2016-04-08 ENCOUNTER — Encounter: Payer: Self-pay | Admitting: Orthopedic Surgery

## 2016-04-08 VITALS — BP 128/83 | HR 71 | Ht 69.0 in | Wt 233.0 lb

## 2016-04-08 DIAGNOSIS — G5621 Lesion of ulnar nerve, right upper limb: Secondary | ICD-10-CM | POA: Diagnosis not present

## 2016-04-08 DIAGNOSIS — G894 Chronic pain syndrome: Secondary | ICD-10-CM

## 2016-04-08 DIAGNOSIS — Z9889 Other specified postprocedural states: Secondary | ICD-10-CM

## 2016-04-08 DIAGNOSIS — M75101 Unspecified rotator cuff tear or rupture of right shoulder, not specified as traumatic: Secondary | ICD-10-CM

## 2016-04-08 MED ORDER — HYDROCODONE-ACETAMINOPHEN 10-325 MG PO TABS: 1.0000 | ORAL_TABLET | ORAL | Status: DC | PRN

## 2016-04-08 NOTE — Progress Notes (Signed)
Follow-up visit  Long-standing workers compensation case  History revision rotator cuff repair October 2016 after that postop MRI showed no recurrence of tear just degeneration of tendon  Initial date of injury was 01/31/2014 initial rotator cuff repair was going well until something happened in therapy and an repeat MRI showed torn cuff  Complains of stiffness right shoulder pain right shoulder  Had ulnar nerve transposition with some relief of ulnar neuropathy please see notes from Dr. Janee Mornhompson. Currently on nortriptyline weaning down to 25 mg daily from 25 mg 3 times a day or 3 a day  Review of Systems  Musculoskeletal: Positive for joint pain.       Right shoulder weakness   BP 128/83 mmHg  Pulse 71  Ht 5\' 9"  (1.753 m)  Wt 233 lb (105.688 kg)  BMI 34.39 kg/m2  Gen. appearance is normal Alexandria Mejia is in good spirits she is awake alert she's oriented 3 mood and affect is normal. Today active abduction is 90 with pain. Shoulder alignment normal mild tenderness para-Acromial region Shoulder stable  Impression  Chronic pain right shoulder  Status post rotator cuff repair with revision cuff repair  Ulnar neuropathy  Continue the following medications Norco 10 mg 325 Ibuprofen 800 mg 3 times a day Robaxin as needed Prilosec to cataract effects of long-term ibuprofen   Return 1 month Continue out of work MMI in October  Do not expect return to previous job duties

## 2016-04-25 ENCOUNTER — Telehealth: Payer: Self-pay | Admitting: Orthopedic Surgery

## 2016-04-25 ENCOUNTER — Other Ambulatory Visit: Payer: Self-pay | Admitting: *Deleted

## 2016-04-25 DIAGNOSIS — M75101 Unspecified rotator cuff tear or rupture of right shoulder, not specified as traumatic: Secondary | ICD-10-CM

## 2016-04-25 MED ORDER — HYDROCODONE-ACETAMINOPHEN 10-325 MG PO TABS: 1.0000 | ORAL_TABLET | ORAL | Status: DC | PRN

## 2016-04-25 NOTE — Telephone Encounter (Signed)
Patient called for refill of medication: HYDROcodone-acetaminophen (NORCO) 10-325 MG tablet [[id number]][[id number]] - please advise.

## 2016-04-26 ENCOUNTER — Other Ambulatory Visit: Payer: Self-pay | Admitting: Orthopedic Surgery

## 2016-05-02 ENCOUNTER — Other Ambulatory Visit: Payer: Self-pay | Admitting: *Deleted

## 2016-05-02 MED ORDER — METHOCARBAMOL 750 MG PO TABS: 750.0000 mg | ORAL_TABLET | Freq: Four times a day (QID) | ORAL | Status: DC

## 2016-05-06 ENCOUNTER — Encounter: Payer: Self-pay | Admitting: Orthopedic Surgery

## 2016-05-06 ENCOUNTER — Ambulatory Visit (INDEPENDENT_AMBULATORY_CARE_PROVIDER_SITE_OTHER): Payer: Worker's Compensation | Admitting: Orthopedic Surgery

## 2016-05-06 VITALS — BP 117/74 | Ht 69.0 in | Wt 231.0 lb

## 2016-05-06 DIAGNOSIS — G894 Chronic pain syndrome: Secondary | ICD-10-CM

## 2016-05-06 DIAGNOSIS — Z9889 Other specified postprocedural states: Secondary | ICD-10-CM

## 2016-05-06 MED ORDER — METHOCARBAMOL 750 MG PO TABS: 750.0000 mg | ORAL_TABLET | Freq: Four times a day (QID) | ORAL | Status: DC

## 2016-05-06 NOTE — Patient Instructions (Signed)
HOME EXERCISES 

## 2016-05-06 NOTE — Progress Notes (Signed)
Patient ID: Alexandria Mejia, female   DOB: [birth year], 45 y.o.   MRN: [id number]  Chief Complaint  Patient presents with  . Follow-up    FOLLOW UP RIGHT ROTATOR CUFF TEAR, DOS 10/12/14    HPI status post multiple rotator cuff surgeries with weakness and pain. Chronic pain has developed in the right shoulder and she has limited flexion to 90.  ROS  Numbness in the right hand has resolved resolved secondary to ulnar nerve decompression at the elbow  BP 117/74 mmHg  Ht 5\' 9"  (1.753 m)  Wt 231 lb (104.781 kg)  BMI 34.10 kg/m2 Gen. appearance is normal grooming and hygiene Orientation to person place and time normal Mood normal Gait is normal normal noncontributory No peripheral edema or swelling is noted in the right upper extremity Sensory exam shows normal sensation to palpation, pressure and soft touch Skin exam no lacerations ulcerations or erythema  Ortho Exam  Abduction 90 flexion 90 external rotation  A/P  with the arm at her side 50 Medical deciscontinue current medications continue home exercises return in a month for pain medication MM I expected in October

## 2016-05-14 ENCOUNTER — Telehealth: Payer: Self-pay | Admitting: Orthopedic Surgery

## 2016-05-14 NOTE — Telephone Encounter (Signed)
REFILL 

## 2016-05-14 NOTE — Telephone Encounter (Signed)
Patient requests refill: HYDROcodone-acetaminophen (NORCO) 10-325 MG tablet [[id number]][[id number]] - please advise.

## 2016-05-16 NOTE — Telephone Encounter (Signed)
05/15/16 Attempted to reach patient.

## 2016-05-22 ENCOUNTER — Other Ambulatory Visit: Payer: Self-pay | Admitting: *Deleted

## 2016-05-22 DIAGNOSIS — M75101 Unspecified rotator cuff tear or rupture of right shoulder, not specified as traumatic: Secondary | ICD-10-CM

## 2016-05-22 MED ORDER — HYDROCODONE-ACETAMINOPHEN 10-325 MG PO TABS: 1.0000 | ORAL_TABLET | ORAL | Status: DC | PRN

## 2016-05-22 NOTE — Telephone Encounter (Signed)
Done. Patient aware and picked up this prescription refill on 04/26/16

## 2016-05-30 ENCOUNTER — Other Ambulatory Visit: Payer: Self-pay | Admitting: *Deleted

## 2016-05-30 DIAGNOSIS — Z9889 Other specified postprocedural states: Secondary | ICD-10-CM

## 2016-05-30 DIAGNOSIS — G894 Chronic pain syndrome: Secondary | ICD-10-CM

## 2016-05-30 MED ORDER — METHOCARBAMOL 750 MG PO TABS: 750.0000 mg | ORAL_TABLET | Freq: Four times a day (QID) | ORAL | Status: DC

## 2016-06-03 ENCOUNTER — Encounter: Payer: Self-pay | Admitting: Orthopedic Surgery

## 2016-06-03 ENCOUNTER — Ambulatory Visit (INDEPENDENT_AMBULATORY_CARE_PROVIDER_SITE_OTHER): Payer: Worker's Compensation | Admitting: Orthopedic Surgery

## 2016-06-03 VITALS — BP 107/77 | HR 74 | Ht 69.0 in | Wt 229.0 lb

## 2016-06-03 DIAGNOSIS — M75101 Unspecified rotator cuff tear or rupture of right shoulder, not specified as traumatic: Secondary | ICD-10-CM | POA: Diagnosis not present

## 2016-06-03 DIAGNOSIS — R52 Pain, unspecified: Secondary | ICD-10-CM

## 2016-06-03 DIAGNOSIS — Z5189 Encounter for other specified aftercare: Secondary | ICD-10-CM

## 2016-06-03 MED ORDER — HYDROCODONE-ACETAMINOPHEN 10-325 MG PO TABS: 1.0000 | ORAL_TABLET | ORAL | Status: DC | PRN

## 2016-06-03 NOTE — Patient Instructions (Signed)
Home exerciises   Refill norco Continue other meds

## 2016-06-03 NOTE — Progress Notes (Signed)
Patient ID: Alexandria PeltMichelle M Mejia, female   DOB: 1971-02-01, 45 y.o.   MRN: [id number]  Chief Complaint  Patient presents with  . Follow-up    Right shoulder RCR DOS 10/12/14 DOI 01/31/14    HPI rotator cuff repair revision,  ROS  Aching especially with brain  BP 107/77 mmHg  Pulse 74  Ht 5\' 9"  (1.753 m)  Wt 229 lb (103.874 kg)  BMI 33.80 kg/m2 Gen. appearance is normal grooming and hygiene Orientation to person place and time normal Mood normal Gait is normal  No peripheral edema or swelling is noted in the right arm Sensory exam shows normal sensation to palpation, pressure and soft touch Skin exam no lacerations ulcerations or erythema  Ortho Exam With the arm at her side her external rotation is 45 she has pain at 90 abduction and 90 flexion and the scapular plane, weakness in abduction as well as flexion  A/P  Medical decision-making  Encounter Diagnoses  Name Primary?  . Rotator cuff tear, right Yes  . Pain management     Continue Norco, Robaxin, ibuprofen, Prilosec  Come back one month    Fuller CanadaStanley Harrison, MD 06/03/2016 11:20 AM

## 2016-06-25 ENCOUNTER — Telehealth: Payer: Self-pay | Admitting: Orthopedic Surgery

## 2016-06-25 DIAGNOSIS — M75101 Unspecified rotator cuff tear or rupture of right shoulder, not specified as traumatic: Secondary | ICD-10-CM

## 2016-06-25 MED ORDER — HYDROCODONE-ACETAMINOPHEN 10-325 MG PO TABS: 1.0000 | ORAL_TABLET | ORAL | Status: DC | PRN

## 2016-06-25 NOTE — Telephone Encounter (Signed)
Patient called to request refill on medication: HYDROcodone-acetaminophen (NORCO) 10-325 MG tablet [[id number]][[id number]] - quantity 84 tablets; patient of Dr Mort SawyersHarrison's - patient aware Dr Romeo AppleHarrison out of office and that Dr Hilda LiasKeeling will review and advise.

## 2016-06-25 NOTE — Telephone Encounter (Signed)
Rx done. 

## 2016-07-01 ENCOUNTER — Other Ambulatory Visit: Payer: Self-pay | Admitting: Orthopedic Surgery

## 2016-07-02 ENCOUNTER — Encounter: Payer: Self-pay | Admitting: Orthopedic Surgery

## 2016-07-02 ENCOUNTER — Ambulatory Visit (INDEPENDENT_AMBULATORY_CARE_PROVIDER_SITE_OTHER): Payer: Worker's Compensation | Admitting: Orthopedic Surgery

## 2016-07-02 VITALS — BP 112/73 | HR 77 | Ht 69.0 in | Wt 229.0 lb

## 2016-07-02 DIAGNOSIS — Z9889 Other specified postprocedural states: Secondary | ICD-10-CM | POA: Diagnosis not present

## 2016-07-02 DIAGNOSIS — R52 Pain, unspecified: Secondary | ICD-10-CM

## 2016-07-02 DIAGNOSIS — Z5189 Encounter for other specified aftercare: Secondary | ICD-10-CM | POA: Diagnosis not present

## 2016-07-02 MED ORDER — OMEPRAZOLE 20 MG PO CPDR: 20.0000 mg | DELAYED_RELEASE_CAPSULE | Freq: Every day | ORAL | Status: DC

## 2016-07-02 NOTE — Progress Notes (Signed)
Patient ID: Alexandria Mejia, female   DOB: [birth year], 45 y.o.   MRN: [id number]  Chief Complaint  Patient presents with  . Follow-up    Right shoulder    HPI status post revision right rotator cuff repair with chronic pain also had a ulnar nerve decompression. Complained of 3 days burning on the medial side of the elbow but resolved  ROS    BP 112/73 mmHg  Pulse 77  Ht 5\' 9"  (1.753 m)  Wt 229 lb (103.874 kg)  BMI 33.80 kg/m2 Gen. appearance is normal grooming and hygiene Orientation to person place and time normal Mood normal Gait is normal  No peripheral edema or swelling is noted in the right arm Sensory exam shows normal sensation to palpation, pressure and soft touch Skin exam no lacerations ulcerations or erythema  Right Shoulder Exam   Range of Motion  Active Abduction: 80        A/P  Medical decision-making  Chronic pain right shoulder with limited right shoulder range of motion  Return 1 month continue current medications of ibuprofen, Robaxin, Prilosec and hydrocodone.  Fuller CanadaStanley Kenzel Ruesch, MD 07/02/2016 10:40 AM

## 2016-07-03 ENCOUNTER — Ambulatory Visit: Payer: Self-pay | Admitting: Orthopedic Surgery

## 2016-07-09 ENCOUNTER — Telehealth: Payer: Self-pay | Admitting: Orthopedic Surgery

## 2016-07-09 NOTE — Telephone Encounter (Signed)
ROUTING TO DR HARRISON FOR APPROVAL AND QUANTITY 

## 2016-07-09 NOTE — Telephone Encounter (Signed)
Hydrocodone-Acetaminophen 10/325mg   Qty 84 Tablets  Take 1 tablet by mouth every 4 (four) hours as needed for pain

## 2016-07-11 ENCOUNTER — Other Ambulatory Visit: Payer: Self-pay | Admitting: Orthopedic Surgery

## 2016-07-11 DIAGNOSIS — M75101 Unspecified rotator cuff tear or rupture of right shoulder, not specified as traumatic: Secondary | ICD-10-CM

## 2016-07-11 MED ORDER — HYDROCODONE-ACETAMINOPHEN 10-325 MG PO TABS: 1.0000 | ORAL_TABLET | ORAL | 0 refills | Status: DC | PRN

## 2016-07-29 ENCOUNTER — Other Ambulatory Visit: Payer: Self-pay | Admitting: *Deleted

## 2016-07-29 ENCOUNTER — Other Ambulatory Visit: Payer: Self-pay | Admitting: Orthopedic Surgery

## 2016-07-29 ENCOUNTER — Telehealth: Payer: Self-pay | Admitting: Orthopedic Surgery

## 2016-07-29 DIAGNOSIS — M75101 Unspecified rotator cuff tear or rupture of right shoulder, not specified as traumatic: Secondary | ICD-10-CM

## 2016-07-29 MED ORDER — HYDROCODONE-ACETAMINOPHEN 10-325 MG PO TABS: 1.0000 | ORAL_TABLET | ORAL | 0 refills | Status: DC | PRN

## 2016-07-29 NOTE — Telephone Encounter (Signed)
Patient called and requested a refill on Hydrocodone/Acetaminophen 10-325 mgs.  Qty 84  Sig: Take 1 tablet by mouth every 4 (four) hours as needed (pain).

## 2016-07-29 NOTE — Telephone Encounter (Signed)
Prescription available 

## 2016-07-29 NOTE — Telephone Encounter (Signed)
Routing to Dr Romeo AppleHarrison for approval and quantity

## 2016-07-29 NOTE — Telephone Encounter (Signed)
approved

## 2016-08-01 ENCOUNTER — Ambulatory Visit (INDEPENDENT_AMBULATORY_CARE_PROVIDER_SITE_OTHER): Payer: Worker's Compensation | Admitting: Orthopedic Surgery

## 2016-08-01 ENCOUNTER — Encounter: Payer: Self-pay | Admitting: Orthopedic Surgery

## 2016-08-01 VITALS — BP 113/70 | HR 89 | Ht 69.0 in | Wt 229.0 lb

## 2016-08-01 DIAGNOSIS — Z9889 Other specified postprocedural states: Secondary | ICD-10-CM

## 2016-08-01 DIAGNOSIS — G894 Chronic pain syndrome: Secondary | ICD-10-CM

## 2016-08-01 DIAGNOSIS — M25511 Pain in right shoulder: Secondary | ICD-10-CM | POA: Diagnosis not present

## 2016-08-01 MED ORDER — METHOCARBAMOL 750 MG PO TABS: 750.0000 mg | ORAL_TABLET | Freq: Four times a day (QID) | ORAL | 6 refills | Status: DC

## 2016-08-01 NOTE — Progress Notes (Signed)
Follow-up visit  Chronic pain right shoulder chronic pain after rotator cuff repair and revision with MRI showing repair healed but function did not return  She's complaining of pain in the superior border of the medial side of the scapula after trying to increase her exercise routine. Complains of a burning pain at that area radiating into the right arm and also complains of some pain in the biceps tendon with cramping consistent with a week rotator cuff and periscapular muscle overworking  Exam tenderness over the medial border of the scapula with reproduction of pain radiating to the right arm  Diagnosis trigger point  Injection  TRIGGER POINT INJECTION  Patient consented verbally for injection of the right posterior/MEDIAL scapula. Timeout confirmed the site of injection A steroid injection was performed at inferior border of the right scapula at the point of maximal tenderness using 1% plain Lidocaine and 40 mg of Depo-Medrol. This was well tolerated.  Follow-up next month for final rating and order given for FCE

## 2016-08-01 NOTE — Patient Instructions (Signed)

## 2016-08-13 ENCOUNTER — Telehealth: Payer: Self-pay | Admitting: Orthopedic Surgery

## 2016-08-13 NOTE — Telephone Encounter (Signed)
Patient requests refill, Hydrocodone

## 2016-08-14 ENCOUNTER — Other Ambulatory Visit: Payer: Self-pay | Admitting: *Deleted

## 2016-08-14 DIAGNOSIS — M75101 Unspecified rotator cuff tear or rupture of right shoulder, not specified as traumatic: Secondary | ICD-10-CM

## 2016-08-14 MED ORDER — HYDROCODONE-ACETAMINOPHEN 10-325 MG PO TABS: 1.0000 | ORAL_TABLET | ORAL | 0 refills | Status: DC | PRN

## 2016-08-14 NOTE — Telephone Encounter (Signed)
refill 

## 2016-08-14 NOTE — Telephone Encounter (Signed)
ROUTING TO DR HARRISON FOR APPROVAL 

## 2016-08-20 ENCOUNTER — Telehealth: Payer: Self-pay

## 2016-08-20 NOTE — Telephone Encounter (Signed)
KEEP 18TH APPT

## 2016-08-20 NOTE — Telephone Encounter (Signed)
ROUTING TO DR HARRISON TO ADVISE 

## 2016-08-21 NOTE — Telephone Encounter (Signed)
Patient informed. 

## 2016-08-29 ENCOUNTER — Other Ambulatory Visit: Payer: Self-pay | Admitting: Orthopedic Surgery

## 2016-08-29 ENCOUNTER — Telehealth: Payer: Self-pay | Admitting: Orthopedic Surgery

## 2016-08-29 NOTE — Telephone Encounter (Signed)
Patient requests a refill on Hydrocodone/Acetaminophen (Norco)  10-325 mgs.  Qty  84  Sig: Take 1 tablet by mouth every 4 (four) hours as needed (pain).

## 2016-08-29 NOTE — Telephone Encounter (Signed)
Routing to Dr Harrison for approval 

## 2016-08-29 NOTE — Telephone Encounter (Signed)
yes

## 2016-08-30 ENCOUNTER — Other Ambulatory Visit: Payer: Self-pay | Admitting: *Deleted

## 2016-08-30 DIAGNOSIS — M75101 Unspecified rotator cuff tear or rupture of right shoulder, not specified as traumatic: Secondary | ICD-10-CM

## 2016-08-30 MED ORDER — HYDROCODONE-ACETAMINOPHEN 10-325 MG PO TABS: 1.0000 | ORAL_TABLET | ORAL | 0 refills | Status: DC | PRN

## 2016-09-02 ENCOUNTER — Encounter: Payer: Self-pay | Admitting: Orthopedic Surgery

## 2016-09-02 ENCOUNTER — Ambulatory Visit (INDEPENDENT_AMBULATORY_CARE_PROVIDER_SITE_OTHER): Payer: Worker's Compensation | Admitting: Orthopedic Surgery

## 2016-09-02 VITALS — BP 119/76 | HR 74 | Ht 69.0 in | Wt 230.0 lb

## 2016-09-02 DIAGNOSIS — G894 Chronic pain syndrome: Secondary | ICD-10-CM | POA: Diagnosis not present

## 2016-09-02 DIAGNOSIS — M25511 Pain in right shoulder: Secondary | ICD-10-CM | POA: Diagnosis not present

## 2016-09-02 DIAGNOSIS — Z9889 Other specified postprocedural states: Secondary | ICD-10-CM

## 2016-09-02 DIAGNOSIS — G5621 Lesion of ulnar nerve, right upper limb: Secondary | ICD-10-CM | POA: Diagnosis not present

## 2016-09-02 MED ORDER — METHOCARBAMOL 750 MG PO TABS: 750.0000 mg | ORAL_TABLET | Freq: Four times a day (QID) | ORAL | 6 refills | Status: DC

## 2016-09-02 NOTE — Progress Notes (Signed)
Patient ID: Alexandria Mejia, female   DOB: 1971-07-12, 45 y.o.   MRN: [id number]  Chief Complaint  Patient presents with  . Follow-up    chronic pain    HPI Alexandria PeltMichelle M Mcdougall is a 45 y.o. female.  Here today for rating  Complains of pain over the distal collarbone During the FCE  Still has trigger point left shoulder HPI Alexandria Mejia had rotator cuff surgery after falling [place] a [place]. She was doing well until she had an event in therapy where she felt a pop after conservative treatment she went back to surgery for re-repair for rotator cuff which went well. However, she never regained range of motion and continued to have. Acromial and deltoid pain.  She had a second opinion and it was noted that she would probably have ongoing shoulder problems and she did home physical therapy and now as per her FCE she is approved for sedentary work with a 5 pound lifting limit on the right upper extremity  Her postoperative course was also complicated by ulnar neuropathy which was corrected with ulnar nerve surgery  The workers compensation carrier has requested [employer] [place] [employer] rating system   Review of Systems Review of Systems Normal neuro  Denies fever   Past Medical History:  Diagnosis Date  . Back pain   . Complication of anesthesia    patient states "with last surgery I was hard to wake up".  . GERD (gastroesophageal reflux disease)   . Migraine      Examination BP 119/76   Pulse 74   Ht 5\' 9"  (1.753 m)   Wt 230 lb (104.3 kg)   BMI 33.97 kg/m   Gen. appearance the patient's appearance is normal with normal grooming and hygiene The patient is oriented to person place and time Mood and affect are normal Lymph nodes along the neck and axilla on the right side are normal.  The right arm has no excessive swelling or varicosities pulses are good temperature is normal there is no edema. Skin incision is well-healed there is tenderness at the superior  portion of the incision near the prominent distal clavicle there is no sign of erythema or warmth.  Her sensation remains normal in the right hand including the ulnar nerve. Ortho Exam We did confirm the range of motion as tested with the FCE There is no instability in the shoulder she has painful range of motion in all planes She does have periscapular pain with a trigger point on the medial side of the right scapula  Cervical spine exam on inspection there is no tenderness to palpation there is no swelling. She has no limitation in range of motion. There is no increase in muscle tension or muscle tone.  She has prominence of the distal clavicle and tenderness as she described during her FCE  Medical decision-making  Diagnosis Encounter Diagnoses  Name Primary?  . Trigger point of shoulder region, right   . Chronic pain syndrome   . H/O repair of right rotator cuff Yes  . Ulnar neuropathy of right upper extremity     Data FCE Plan (risk) Reinjected the trigger point medially on the right scapula  Trigger point injection TRIGGER POINT INJECTION  Patient consented verbally for injection of the right posterior/MEDIAL scapula. Timeout confirmed the site of injection A steroid injection was performed at superior border of the right scapula at the point of maximal tenderness using 1% plain Lidocaine and 40 mg of Depo-Medrol. This was well  tolerated.   Her MMI is today September 18  Her return to work is to be October 18 with the restrictions as noted in FCE which is a 5 pound lifting limit with the right arm and sedentary work  She will need a chronic pain management appointment to manage her chronic pain she is currently on hydrocodone, ibuprofen, Robaxin. With proton pump inhibitor due to the chronic ibuprofen use   Fuller Canada, MD 09/02/2016 3:30 PM

## 2016-09-11 ENCOUNTER — Other Ambulatory Visit: Payer: Self-pay | Admitting: Orthopedic Surgery

## 2016-09-11 ENCOUNTER — Telehealth: Payer: Self-pay | Admitting: Orthopedic Surgery

## 2016-09-11 DIAGNOSIS — M75101 Unspecified rotator cuff tear or rupture of right shoulder, not specified as traumatic: Secondary | ICD-10-CM

## 2016-09-11 MED ORDER — HYDROCODONE-ACETAMINOPHEN 10-325 MG PO TABS: 1.0000 | ORAL_TABLET | ORAL | 0 refills | Status: DC | PRN

## 2016-09-11 NOTE — Telephone Encounter (Signed)
Patient requests refill on Hydrocodone/Acetaminophen 10-325 mgs.   Qty 84        Sig: Take 1 tablet by mouth every 4 (four) hours as needed (pain).

## 2016-09-11 NOTE — Telephone Encounter (Signed)
Routing to Dr Harrison for approval 

## 2016-09-16 ENCOUNTER — Encounter: Payer: Self-pay | Admitting: Orthopedic Surgery

## 2016-09-16 ENCOUNTER — Ambulatory Visit (INDEPENDENT_AMBULATORY_CARE_PROVIDER_SITE_OTHER): Payer: Worker's Compensation | Admitting: Orthopedic Surgery

## 2016-09-16 VITALS — BP 103/73 | HR 77 | Wt 223.0 lb

## 2016-09-16 DIAGNOSIS — G894 Chronic pain syndrome: Secondary | ICD-10-CM

## 2016-09-16 DIAGNOSIS — Z9889 Other specified postprocedural states: Secondary | ICD-10-CM

## 2016-09-16 DIAGNOSIS — M25511 Pain in right shoulder: Secondary | ICD-10-CM | POA: Diagnosis not present

## 2016-09-16 NOTE — Patient Instructions (Addendum)
You have received an injection of steroids into the joint. 15% of patients will have increased pain within the 24 hours postinjection.   This is transient and will go away.   We recommend that you use ice packs on the injection site for 20 minutes every 2 hours and extra strength Tylenol 2 tablets every 8 as needed until the pain resolves.  If you continue to have pain after taking the Tylenol and using the ice please call the office for further instructions.   Return to work with restrictions as noted in [employer] on October 18  MMI was September 18

## 2016-09-16 NOTE — Progress Notes (Signed)
Patient ID: Alexandria PeltMichelle M Stimpson, female   DOB: 1971-11-21, 45 y.o.   MRN: [id number]    Chief Complaint  Patient presents with  . Follow-up    TRIGGER POINT RT SHOULDER, H/O RT RCR 10/12/14    BP 103/73   Pulse 77   Wt 223 lb (101.2 kg)   BMI 32.93 kg/m   Trigger point injection TRIGGER POINT INJECTION   Patient consented verbally for injection of the right posterior/MEDIAL scapula. Timeout confirmed the site of injection A steroid injection was performed at superior border of the right scapula at the point of maximal tenderness using 1% plain Lidocaine and 40 mg of Depo-Medrol. This was well tolerated.   FU 6 WEEKS    RETURN TO WORK DATE IS OCT 18   Addendum to the last note will need chronic trigger point injections on an as-needed basis

## 2016-09-21 ENCOUNTER — Encounter (HOSPITAL_COMMUNITY): Payer: Self-pay | Admitting: Emergency Medicine

## 2016-09-21 DIAGNOSIS — Z79899 Other long term (current) drug therapy: Secondary | ICD-10-CM | POA: Insufficient documentation

## 2016-09-21 DIAGNOSIS — R109 Unspecified abdominal pain: Secondary | ICD-10-CM | POA: Insufficient documentation

## 2016-09-21 DIAGNOSIS — F17210 Nicotine dependence, cigarettes, uncomplicated: Secondary | ICD-10-CM | POA: Insufficient documentation

## 2016-09-21 LAB — URINALYSIS, ROUTINE W REFLEX MICROSCOPIC
Bilirubin Urine: NEGATIVE
GLUCOSE, UA: 500 mg/dL — AB
LEUKOCYTES UA: NEGATIVE
NITRITE: NEGATIVE
PH: 5.5 (ref 5.0–8.0)

## 2016-09-21 LAB — URINE MICROSCOPIC-ADD ON

## 2016-09-21 NOTE — ED Triage Notes (Signed)
Pt reports sharp R side flank pain with radiation into his abdomen. Pt has not voided since this am.

## 2016-09-22 ENCOUNTER — Emergency Department (HOSPITAL_COMMUNITY)
Admission: EM | Admit: 2016-09-22 | Discharge: 2016-09-22 | Disposition: A | Discharge status: Home / Self Care | Payer: Self-pay | Attending: Emergency Medicine | Admitting: Emergency Medicine

## 2016-09-22 ENCOUNTER — Emergency Department (HOSPITAL_COMMUNITY): Payer: Self-pay

## 2016-09-22 DIAGNOSIS — R109 Unspecified abdominal pain: Secondary | ICD-10-CM

## 2016-09-22 LAB — COMPREHENSIVE METABOLIC PANEL
ALK PHOS: 65 U/L (ref 38–126)
ALT: 40 U/L (ref 14–54)
AST: 24 U/L (ref 15–41)
Albumin: 3.9 g/dL (ref 3.5–5.0)
Anion gap: 8 (ref 5–15)
BILIRUBIN TOTAL: 0.5 mg/dL (ref 0.3–1.2)
BUN: 10 mg/dL (ref 6–20)
CALCIUM: 8.8 mg/dL — AB (ref 8.9–10.3)
CO2: 22 mmol/L (ref 22–32)
CREATININE: 0.55 mg/dL (ref 0.44–1.00)
Chloride: 103 mmol/L (ref 101–111)
GFR calc Af Amer: >60 mL/min (ref >60)
GLUCOSE: 187 mg/dL — AB (ref 65–99)
POTASSIUM: 3 mmol/L — AB (ref 3.5–5.1)
Sodium: 133 mmol/L — ABNORMAL LOW (ref 135–145)
TOTAL PROTEIN: 6.9 g/dL (ref 6.5–8.1)

## 2016-09-22 LAB — CBC WITH DIFFERENTIAL/PLATELET
BASOS ABS: 0 10*3/uL (ref 0.0–0.1)
Basophils Relative: 0 %
Eosinophils Absolute: 0.2 10*3/uL (ref 0.0–0.7)
Eosinophils Relative: 1 %
HEMATOCRIT: 40.9 % (ref 36.0–46.0)
HEMOGLOBIN: 13.9 g/dL (ref 12.0–15.0)
LYMPHS PCT: 40 %
Lymphs Abs: 4.7 10*3/uL — ABNORMAL HIGH (ref 0.7–4.0)
MCH: 31.4 pg (ref 26.0–34.0)
MCHC: 34 g/dL (ref 30.0–36.0)
MCV: 92.5 fL (ref 78.0–100.0)
MONO ABS: 0.9 10*3/uL (ref 0.1–1.0)
Monocytes Relative: 7 %
NEUTROS ABS: 6.1 10*3/uL (ref 1.7–7.7)
NEUTROS PCT: 52 %
Platelets: 271 10*3/uL (ref 150–400)
RBC: 4.42 MIL/uL (ref 3.87–5.11)
RDW: 13 % (ref 11.5–15.5)
WBC: 11.9 10*3/uL — ABNORMAL HIGH (ref 4.0–10.5)

## 2016-09-22 LAB — LIPASE, BLOOD: LIPASE: 26 U/L (ref 11–51)

## 2016-09-22 LAB — I-STAT BETA HCG BLOOD, ED (MC, WL, AP ONLY): I-stat hCG, quantitative: <5 m[IU]/mL (ref <5)

## 2016-09-22 MED ORDER — ONDANSETRON HCL 4 MG/2ML IJ SOLN
4.0000 mg | Freq: Once | INTRAMUSCULAR | Status: AC
  Administered 2016-09-22: 4 mg via INTRAVENOUS
  Filled 2016-09-22: qty 2

## 2016-09-22 MED ORDER — HYDROMORPHONE HCL 1 MG/ML IJ SOLN
1.0000 mg | Freq: Once | INTRAMUSCULAR | Status: AC
  Administered 2016-09-22: 1 mg via INTRAVENOUS
  Filled 2016-09-22: qty 1

## 2016-09-22 MED ORDER — IOPAMIDOL (ISOVUE-300) INJECTION 61%
100.0000 mL | Freq: Once | INTRAVENOUS | Status: AC | PRN
  Administered 2016-09-22: 100 mL via INTRAVENOUS

## 2016-09-22 MED ORDER — SODIUM CHLORIDE 0.9 % IV BOLUS (SEPSIS)
500.0000 mL | Freq: Once | INTRAVENOUS | Status: AC
  Administered 2016-09-22: 500 mL via INTRAVENOUS

## 2016-09-22 NOTE — ED Provider Notes (Signed)
[facility] Provider Note   CSN: [id number] Arrival date & time: 09/21/16  2241 By signing my name below, I, Bridgette Habermann, attest that this documentation has been prepared under the direction and in the presence of Gilda Crease, MD. Electronically Signed: Bridgette Habermann, ED Scribe. 09/22/16. 12:25 AM.  History   Chief Complaint Chief Complaint  Patient presents with  . Flank Pain   HPI Comments: Alexandria Mejia is a 45 y.o. female who presents to the Emergency Department complaining of 10/10, sharp, right-sided flank pain radiating into her abdomen onset one day ago. Pt states she has not voided since this morning. Pain is exacerbated with movement and palpation. No alleviating factors noted. Pt has h/o kidney stones but notes that this pain does not feel similar. Pt has h/o cholecystectomy and appendectomy. Pt denies fever.   The history is provided by the patient. No language interpreter was used.    Past Medical History:  Diagnosis Date  . Back pain   . Complication of anesthesia    patient states "with last surgery I was hard to wake up".  . GERD (gastroesophageal reflux disease)   . Migraine     Patient Active Problem List   Diagnosis Date Noted  . H/O repair of right rotator cuff 10/24/2014  . Partial tear of rotator cuff 03/01/2014  . Shoulder injury 02/14/2014  . Rotator cuff tear, right 02/14/2014  . Routine general medical examination at a health care facility 09/17/2012  . Shoulder pain 09/17/2012  . Stress 09/17/2012  . Candidiasis, intertrigo 09/17/2012  . Obesity, unspecified 08/04/2012  . Chronic back pain 08/04/2012  . Nausea 08/04/2012  . Tobacco user 08/04/2012    Past Surgical History:  Procedure Laterality Date  . APPENDECTOMY    . BACK SURGERY     lumbar-herniated disc  . CHOLECYSTECTOMY    . FLEXOR TENOTOMY Right 10/12/2014   Procedure: BICEPS TENOTOMY;  Surgeon: Vickki Hearing, MD;  Location: [facility];  Service: Orthopedics;   Laterality: Right;  . NECK SURGERY     decompression of 7 disc  . SHOULDER ARTHROSCOPY WITH ROTATOR CUFF REPAIR Right 05/27/2014   Procedure: SHOULDER ARTHROSCOPY WITH ROTATOR CUFF REPAIR, subscalpularis repair, open supraspinatus repair;  Surgeon: Vickki Hearing, MD;  Location: [facility];  Service: Orthopedics;  Laterality: Right;  . SHOULDER OPEN ROTATOR CUFF REPAIR Right 10/12/2014   Procedure: OPEN ROTATOR CUFF REPAIR SHOULDER;  Surgeon: Vickki Hearing, MD;  Location: [facility];  Service: Orthopedics;  Laterality: Right;  . ULNAR NERVE TRANSPOSITION Right 11/13/2015   Procedure: RIGHT ULNAR NERVE TRANSPOSITION;  Surgeon: Mack Hook, MD;  Location: [facility];  Service: Orthopedics;  Laterality: Right;    OB History    No data available       Home Medications    Prior to Admission medications   Medication Sig Start Date End Date Taking? Authorizing Provider  HYDROcodone-acetaminophen (NORCO) 10-325 MG tablet Take 1 tablet by mouth every 4 (four) hours as needed (pain). 09/11/16   Vickki Hearing, MD  ibuprofen (ADVIL,MOTRIN) 800 MG tablet TAKE 1 TABLET(800 MG) BY MOUTH THREE TIMES DAILY 08/30/16   Vickki Hearing, MD  methocarbamol (ROBAXIN) 750 MG tablet Take 1 tablet (750 mg total) by mouth 4 (four) times daily. 09/02/16   Vickki Hearing, MD  nortriptyline (PAMELOR) 25 MG capsule TK 1 TO 3 CS PO QHS 03/14/16   Historical Provider, MD  omeprazole (PRILOSEC) 20 MG capsule Take 1 capsule (20  mg total) by mouth daily. 07/02/16   Vickki Hearing, MD  promethazine (PHENERGAN) 25 MG tablet Take 1 tablet (25 mg total) by mouth every 6 (six) hours as needed for nausea or vomiting. 08/22/15   Vickki Hearing, MD    Family History Family History  Problem Relation Age of Onset  . COPD Mother   . Arthritis Mother   . Diabetes Father   . Heart disease Father   . Hypertension Brother   . Diabetes Paternal Grandmother   . Diabetes Maternal Grandmother   .  Dementia Paternal Grandfather     Social History Social History  Substance Use Topics  . Smoking status: Current Some Day Smoker    Packs/day: 0.50    Years: 30.00    Types: Cigarettes  . Smokeless tobacco: Never Used  . Alcohol use No     Allergies   Codeine and Sulfa antibiotics   Review of Systems Review of Systems  Constitutional: Negative for fever.  Gastrointestinal: Positive for abdominal pain.  Genitourinary: Positive for flank pain.  All other systems reviewed and are negative.    Physical Exam Updated Vital Signs BP 131/82   Pulse 81   Temp 98.2 F (36.8 C) (Oral)   Resp 16   Ht 5\' 9"  (1.753 m)   Wt 228 lb (103.4 kg)   SpO2 98%   BMI 33.67 kg/m   Physical Exam  Constitutional: She is oriented to person, place, and time. She appears well-developed and well-nourished. No distress.  HENT:  Head: Normocephalic and atraumatic.  Right Ear: Hearing normal.  Left Ear: Hearing normal.  Nose: Nose normal.  Mouth/Throat: Oropharynx is clear and moist and mucous membranes are normal.  Eyes: Conjunctivae and EOM are normal. Pupils are equal, round, and reactive to light.  Neck: Normal range of motion. Neck supple.  Cardiovascular: Regular rhythm, S1 normal and S2 normal.  Exam reveals no gallop and no friction rub.   No murmur heard. Pulmonary/Chest: Effort normal and breath sounds normal. No respiratory distress. She exhibits tenderness.  Diffuse right lower chest wall tenderness  Abdominal: Soft. Normal appearance and bowel sounds are normal. There is no hepatosplenomegaly. There is tenderness. There is no rebound, no guarding, no tenderness at McBurney's point and negative Murphy's sign. No hernia.  Upper abdominal tenderness  Musculoskeletal: Normal range of motion.  Neurological: She is alert and oriented to person, place, and time. She has normal strength. No cranial nerve deficit or sensory deficit. Coordination normal. GCS eye subscore is 4. GCS verbal  subscore is 5. GCS motor subscore is 6.  Skin: Skin is warm, dry and intact. No rash noted. No cyanosis.  Psychiatric: She has a normal mood and affect. Her speech is normal and behavior is normal. Thought content normal.  Nursing note and vitals reviewed.  ED Treatments / Results  DIAGNOSTIC STUDIES: Oxygen Saturation is 98% on RA, normal by my interpretation.    COORDINATION OF CARE: 12:22 AM Discussed treatment plan with pt at bedside which includes lab work and pt agreed to plan.  Labs (all labs ordered are listed, but only abnormal results are displayed) Labs Reviewed  URINALYSIS, ROUTINE W REFLEX MICROSCOPIC (NOT AT [facility]) - Abnormal; Notable for the following:       Result Value   Specific Gravity, Urine >1.030 (*)    Glucose, UA 500 (*)    Hgb urine dipstick SMALL (*)    Ketones, ur TRACE (*)    Protein, ur TRACE (*)  All other components within normal limits  URINE MICROSCOPIC-ADD ON - Abnormal; Notable for the following:    Squamous Epithelial / LPF TOO NUMEROUS TO COUNT (*)    Bacteria, UA MANY (*)    All other components within normal limits  CBC WITH DIFFERENTIAL/PLATELET - Abnormal; Notable for the following:    WBC 11.9 (*)    Lymphs Abs 4.7 (*)    All other components within normal limits  COMPREHENSIVE METABOLIC PANEL - Abnormal; Notable for the following:    Sodium 133 (*)    Potassium 3.0 (*)    Glucose, Bld 187 (*)    Calcium 8.8 (*)    All other components within normal limits  LIPASE, BLOOD  I-STAT BETA HCG BLOOD, ED ([facility], [facility], [facility] ONLY)    EKG  EKG Interpretation None       Radiology Ct Abdomen Pelvis W Contrast  Result Date: 09/22/2016 CLINICAL DATA:  Sharp right flank pain radiating into the abdomen for 1 day. Pain exacerbated with movement and palpation. EXAM: CT ABDOMEN AND PELVIS WITH CONTRAST TECHNIQUE: Multidetector CT imaging of the abdomen and pelvis was performed using the standard protocol following bolus administration of intravenous  contrast. CONTRAST:  ISOVUE-300 IOPAMIDOL (ISOVUE-300) INJECTION 61% COMPARISON:  07/28/2012 FINDINGS: Lower chest: Lung bases are clear. Hepatobiliary: No focal liver abnormality is seen. Status post cholecystectomy. No biliary dilatation. Pancreas: Unremarkable. No pancreatic ductal dilatation or surrounding inflammatory changes. Spleen: Normal in size without focal abnormality. Adrenals/Urinary Tract: Adrenal glands are unremarkable. Kidneys are normal, without renal calculi, focal lesion, or hydronephrosis. Bladder is unremarkable. Stomach/Bowel: Stomach is within normal limits. Appendix is surgically absent. No evidence of bowel wall thickening, distention, or inflammatory changes. Vascular/Lymphatic: No significant vascular findings are present. No enlarged abdominal or pelvic lymph nodes. Reproductive: Uterus and bilateral adnexa are unremarkable. Other: No abdominal wall hernia or abnormality. No abdominopelvic ascites. Musculoskeletal: No acute or significant osseous findings. IMPRESSION: No its cyst demonstrated in the abdomen or pelvis. No evidence of bowel obstruction or inflammation. No evidence of renal obstruction. Electronically Signed   By: Burman Nieves M.D.   On: 09/22/2016 02:21    Procedures Procedures (including critical care time)  Medications Ordered in ED Medications  sodium chloride 0.9 % bolus 500 mL (500 mLs Intravenous New Bag/Given 09/22/16 0126)  ondansetron (ZOFRAN) injection 4 mg (4 mg Intravenous Given 09/22/16 0102)  HYDROmorphone (DILAUDID) injection 1 mg (1 mg Intravenous Given 09/22/16 0102)  iopamidol (ISOVUE-300) 61 % injection 100 mL (100 mLs Intravenous Contrast Given 09/22/16 0154)     Initial Impression / Assessment and Plan / ED Course  I have reviewed the triage vital signs and the nursing notes.  Pertinent labs & imaging results that were available during my care of the patient were reviewed by me and considered in my medical decision making (see  chart for details).  Clinical Course   Patient presents with complaints of right-sided abdominal and flank pain. Symptoms have been present for 1 day. Patient did have tenderness over the right costal margin but also right lateral and right upper abdominal region as well. She is status post cholecystectomy and appendectomy. Lab work was unremarkable. CT scan performed to further evaluate. No acute abnormality noted. Pain of unclear etiology, but no acute surgical or emergent process is felt to be present. Will discharge, patient already has chronic narcotic analgesia prescribed at home.  Final Clinical Impressions(s) / ED Diagnoses   Final diagnoses:  Flank pain   I personally  performed the services described in this documentation, which was scribed in my presence. The recorded information has been reviewed and is accurate.   New Prescriptions New Prescriptions   No medications on file     Gilda Creasehristopher J Pollina, MD 09/22/16 [phone]

## 2016-09-25 ENCOUNTER — Other Ambulatory Visit: Payer: Self-pay | Admitting: *Deleted

## 2016-09-25 ENCOUNTER — Telehealth: Payer: Self-pay | Admitting: Orthopedic Surgery

## 2016-09-25 DIAGNOSIS — M75101 Unspecified rotator cuff tear or rupture of right shoulder, not specified as traumatic: Secondary | ICD-10-CM

## 2016-09-25 MED ORDER — HYDROCODONE-ACETAMINOPHEN 10-325 MG PO TABS: 1.0000 | ORAL_TABLET | ORAL | 0 refills | Status: DC | PRN

## 2016-09-25 NOTE — Telephone Encounter (Signed)
Hydrocodone-Acetaminophen 10/325mg   Qty 84 Tablets  Take 1 tablet by mouth every 4 (four) hours as needed (pain).

## 2016-10-02 ENCOUNTER — Encounter: Payer: Self-pay | Admitting: Orthopedic Surgery

## 2016-10-02 ENCOUNTER — Ambulatory Visit (INDEPENDENT_AMBULATORY_CARE_PROVIDER_SITE_OTHER): Payer: Worker's Compensation | Admitting: Orthopedic Surgery

## 2016-10-02 DIAGNOSIS — M25511 Pain in right shoulder: Secondary | ICD-10-CM

## 2016-10-02 NOTE — Progress Notes (Signed)
Recheck right shoulder. Patient requests trigger injection in the right shoulder  Point tenderness over the right medial border of the scapula  Skin prepared with alcohol and ethyl chloride. Verbal consent and timeout confirmed shot site and prepared skin appropriately  Injection with 3 mL of lidocaine 1% 40 mg of Depo-Medrol  Follow-up as needed

## 2016-10-14 ENCOUNTER — Telehealth: Payer: Self-pay | Admitting: Orthopedic Surgery

## 2016-10-14 ENCOUNTER — Other Ambulatory Visit: Payer: Self-pay | Admitting: *Deleted

## 2016-10-14 DIAGNOSIS — M75101 Unspecified rotator cuff tear or rupture of right shoulder, not specified as traumatic: Secondary | ICD-10-CM

## 2016-10-14 MED ORDER — HYDROCODONE-ACETAMINOPHEN 10-325 MG PO TABS: 1.0000 | ORAL_TABLET | ORAL | 0 refills | Status: DC | PRN

## 2016-10-14 NOTE — Telephone Encounter (Signed)
ROUTING TO DR HARRISON FOR APPROVAL 

## 2016-10-14 NOTE — Telephone Encounter (Signed)
Yes

## 2016-10-14 NOTE — Telephone Encounter (Signed)
Patient requests a refill on Hydrocodone/Acetaminophen 10-325 mgs.   Qty  84  Sig: Take 1 tablet by mouth every 4 (four) hours as needed (pain).

## 2016-10-28 ENCOUNTER — Telehealth: Payer: Self-pay | Admitting: Orthopedic Surgery

## 2016-10-28 ENCOUNTER — Ambulatory Visit: Payer: Self-pay | Admitting: Orthopedic Surgery

## 2016-10-28 ENCOUNTER — Other Ambulatory Visit: Payer: Self-pay | Admitting: *Deleted

## 2016-10-28 DIAGNOSIS — Z9889 Other specified postprocedural states: Secondary | ICD-10-CM

## 2016-10-28 DIAGNOSIS — G894 Chronic pain syndrome: Secondary | ICD-10-CM

## 2016-10-28 MED ORDER — IBUPROFEN 800 MG PO TABS: 800.0000 mg | ORAL_TABLET | Freq: Three times a day (TID) | ORAL | 0 refills | Status: DC

## 2016-10-28 MED ORDER — METHOCARBAMOL 750 MG PO TABS: 750.0000 mg | ORAL_TABLET | Freq: Four times a day (QID) | ORAL | 6 refills | Status: DC

## 2016-10-28 NOTE — Telephone Encounter (Signed)
Patient requests refills on:  methocarbamol (ROBAXIN) 750 MG tablet 60 tablet 6 09/02/2016    Sig - Route: Take 1 tablet (750 mg total) by mouth 4 (four) times daily. - Oral    And  ibuprofen (ADVIL,MOTRIN) 800 MG tablet 90 tablet 0 08/30/2016    Sig: TAKE 1 TABLET(800 MG) BY MOUTH THREE TIMES DAILY    - appointment moved from 09/27/16 to 10/05/16 - miscommunication with Workers comp, patient, and previous injection.  Nurse case manager to accompany patient to the appointment; states she is still working on the referral to Pain management, [place]. We will be notified when this has been set up.

## 2016-10-28 NOTE — Telephone Encounter (Signed)
Refills sent as requested

## 2016-11-05 ENCOUNTER — Ambulatory Visit (INDEPENDENT_AMBULATORY_CARE_PROVIDER_SITE_OTHER): Payer: Worker's Compensation | Admitting: Orthopedic Surgery

## 2016-11-05 ENCOUNTER — Encounter: Payer: Self-pay | Admitting: Orthopedic Surgery

## 2016-11-05 ENCOUNTER — Ambulatory Visit: Payer: Self-pay | Admitting: Orthopedic Surgery

## 2016-11-05 DIAGNOSIS — M25511 Pain in right shoulder: Secondary | ICD-10-CM | POA: Diagnosis not present

## 2016-11-05 DIAGNOSIS — M75101 Unspecified rotator cuff tear or rupture of right shoulder, not specified as traumatic: Secondary | ICD-10-CM

## 2016-11-05 DIAGNOSIS — G8929 Other chronic pain: Secondary | ICD-10-CM | POA: Diagnosis not present

## 2016-11-05 MED ORDER — HYDROCODONE-ACETAMINOPHEN 10-325 MG PO TABS: 1.0000 | ORAL_TABLET | ORAL | 0 refills | Status: DC | PRN

## 2016-11-05 NOTE — Progress Notes (Signed)
Patient requests trigger point  injection in the right shoulder   Point tenderness over the right medial border of the scapula   Skin prepared with alcohol and ethyl chloride. Verbal consent and timeout confirmed shot site and prepared skin appropriately   Injection with 3 mL of lidocaine 1% 40 mg of Depo-Medrol   Follow-up as needed  Meds ordered this encounter  Medications  . HYDROcodone-acetaminophen (NORCO) 10-325 MG tablet    Sig: Take 1 tablet by mouth every 4 (four) hours as needed (pain).    Dispense:  84 tablet    Refill:  0

## 2016-11-05 NOTE — Patient Instructions (Signed)

## 2016-11-18 ENCOUNTER — Other Ambulatory Visit: Payer: Self-pay | Admitting: Orthopedic Surgery

## 2016-11-18 ENCOUNTER — Telehealth: Payer: Self-pay | Admitting: Orthopedic Surgery

## 2016-11-18 NOTE — Telephone Encounter (Signed)
Routing to Dr Harrison for approval 

## 2016-11-18 NOTE — Telephone Encounter (Signed)
Patient requests a refill on Hydrocodone/Acetaminophen 10-325  Mgs.   Qty  84  Sig: Take 1 tablet by mouth every 4 (four) hours as needed (pain).

## 2016-11-19 ENCOUNTER — Other Ambulatory Visit: Payer: Self-pay | Admitting: *Deleted

## 2016-11-19 DIAGNOSIS — M25511 Pain in right shoulder: Secondary | ICD-10-CM

## 2016-11-19 DIAGNOSIS — G8929 Other chronic pain: Secondary | ICD-10-CM

## 2016-11-19 MED ORDER — HYDROCODONE-ACETAMINOPHEN 10-325 MG PO TABS: 1.0000 | ORAL_TABLET | ORAL | 0 refills | Status: DC | PRN

## 2016-11-19 NOTE — Telephone Encounter (Signed)
YES

## 2016-12-02 ENCOUNTER — Telehealth: Payer: Self-pay | Admitting: Orthopedic Surgery

## 2016-12-02 DIAGNOSIS — M25511 Pain in right shoulder: Secondary | ICD-10-CM

## 2016-12-02 DIAGNOSIS — G8929 Other chronic pain: Secondary | ICD-10-CM

## 2016-12-02 MED ORDER — HYDROCODONE-ACETAMINOPHEN 10-325 MG PO TABS: 1.0000 | ORAL_TABLET | ORAL | 0 refills | Status: DC | PRN

## 2016-12-02 NOTE — Telephone Encounter (Signed)
Routing to Dr Harrison 

## 2016-12-02 NOTE — Telephone Encounter (Signed)
Hydrocodone-Acetaminophen  Qty 84 Tablets  Take 1 tablet by mouth every 4 (four) hours as needed (pain).

## 2016-12-18 ENCOUNTER — Encounter (HOSPITAL_COMMUNITY): Payer: Self-pay | Admitting: Emergency Medicine

## 2016-12-18 ENCOUNTER — Emergency Department (HOSPITAL_COMMUNITY)
Admission: EM | Admit: 2016-12-18 | Discharge: 2016-12-18 | Disposition: A | Discharge status: Home / Self Care | Payer: Self-pay | Attending: Emergency Medicine | Admitting: Emergency Medicine

## 2016-12-18 DIAGNOSIS — R110 Nausea: Secondary | ICD-10-CM | POA: Insufficient documentation

## 2016-12-18 DIAGNOSIS — Z79899 Other long term (current) drug therapy: Secondary | ICD-10-CM | POA: Insufficient documentation

## 2016-12-18 DIAGNOSIS — L02414 Cutaneous abscess of left upper limb: Secondary | ICD-10-CM | POA: Insufficient documentation

## 2016-12-18 DIAGNOSIS — F17210 Nicotine dependence, cigarettes, uncomplicated: Secondary | ICD-10-CM | POA: Insufficient documentation

## 2016-12-18 DIAGNOSIS — L0291 Cutaneous abscess, unspecified: Secondary | ICD-10-CM

## 2016-12-18 MED ORDER — DOXYCYCLINE HYCLATE 100 MG PO TABS
100.0000 mg | ORAL_TABLET | Freq: Once | ORAL | Status: AC
  Administered 2016-12-18: 100 mg via ORAL
  Filled 2016-12-18: qty 1

## 2016-12-18 MED ORDER — HYDROCODONE-ACETAMINOPHEN 5-325 MG PO TABS
1.0000 | ORAL_TABLET | Freq: Once | ORAL | Status: AC
  Administered 2016-12-18: 1 via ORAL
  Filled 2016-12-18: qty 1

## 2016-12-18 MED ORDER — DOXYCYCLINE HYCLATE 100 MG PO CAPS: 100.0000 mg | ORAL_CAPSULE | Freq: Two times a day (BID) | ORAL | 0 refills | Status: DC

## 2016-12-18 MED ORDER — LIDOCAINE-EPINEPHRINE (PF) 1 %-1:200000 IJ SOLN
10.0000 mL | Freq: Once | INTRAMUSCULAR | Status: AC
  Administered 2016-12-18: 10 mL
  Filled 2016-12-18: qty 30

## 2016-12-18 NOTE — ED Provider Notes (Signed)
[facility] Provider Note   CSN: [id number] Arrival date & time: 12/18/16  0547     History   Chief Complaint Chief Complaint  Patient presents with  . Abscess   Patient gave verbal permission to utilize photo for medical documentation only The image was not stored on any personal device  HPI Alexandria PeltMichelle M Willbanks is a 46 y.o. female.  The history is provided by the patient.  Abscess  Location:  Shoulder/arm Shoulder/arm abscess location:  L forearm Abscess quality: induration, painful, redness and warmth   Duration:  3 days Progression:  Worsening Pain details:    Quality:  Dull   Severity:  Moderate   Timing:  Constant   Progression:  Worsening Chronicity:  New Context: not diabetes and not injected drug use   Relieved by:  Nothing Worsened by:  Draining/squeezing Associated symptoms: nausea   Associated symptoms: no fever   pt reports she had tattoo placed on volar aspect of left forearm about 2 weeks ago She reports the area of infection started about 3 days ago   Past Medical History:  Diagnosis Date  . Back pain   . Complication of anesthesia    patient states "with last surgery I was hard to wake up".  . GERD (gastroesophageal reflux disease)   . Migraine     Patient Active Problem List   Diagnosis Date Noted  . H/O repair of right rotator cuff 10/24/2014  . Partial tear of rotator cuff 03/01/2014  . Shoulder injury 02/14/2014  . Rotator cuff tear, right 02/14/2014  . Routine general medical examination at a health care facility 09/17/2012  . Shoulder pain 09/17/2012  . Stress 09/17/2012  . Candidiasis, intertrigo 09/17/2012  . Obesity, unspecified 08/04/2012  . Chronic back pain 08/04/2012  . Nausea 08/04/2012  . Tobacco user 08/04/2012    Past Surgical History:  Procedure Laterality Date  . APPENDECTOMY    . BACK SURGERY     lumbar-herniated disc  . CHOLECYSTECTOMY    . FLEXOR TENOTOMY Right 10/12/2014   Procedure: BICEPS  TENOTOMY;  Surgeon: Vickki HearingStanley E Harrison, MD;  Location: [facility];  Service: Orthopedics;  Laterality: Right;  . NECK SURGERY     decompression of 7 disc  . SHOULDER ARTHROSCOPY WITH ROTATOR CUFF REPAIR Right 05/27/2014   Procedure: SHOULDER ARTHROSCOPY WITH ROTATOR CUFF REPAIR, subscalpularis repair, open supraspinatus repair;  Surgeon: Vickki HearingStanley E Harrison, MD;  Location: [facility];  Service: Orthopedics;  Laterality: Right;  . SHOULDER OPEN ROTATOR CUFF REPAIR Right 10/12/2014   Procedure: OPEN ROTATOR CUFF REPAIR SHOULDER;  Surgeon: Vickki HearingStanley E Harrison, MD;  Location: [facility];  Service: Orthopedics;  Laterality: Right;  . ULNAR NERVE TRANSPOSITION Right 11/13/2015   Procedure: RIGHT ULNAR NERVE TRANSPOSITION;  Surgeon: Mack Hookavid Thompson, MD;  Location: [facility];  Service: Orthopedics;  Laterality: Right;    OB History    No data available       Home Medications    Prior to Admission medications   Medication Sig Start Date End Date Taking? Authorizing Provider  HYDROcodone-acetaminophen (NORCO) 10-325 MG tablet Take 1 tablet by mouth every 4 (four) hours as needed (pain). 12/02/16   Vickki HearingStanley E Harrison, MD  ibuprofen (ADVIL,MOTRIN) 800 MG tablet TAKE 1 TABLET(800 MG) BY MOUTH THREE TIMES DAILY 11/19/16   Vickki HearingStanley E Harrison, MD  methocarbamol (ROBAXIN) 750 MG tablet Take 1 tablet (750 mg total) by mouth 4 (four) times daily. 10/28/16   Vickki HearingStanley E Harrison, MD  nortriptyline (PAMELOR) 25  MG capsule TK 1 TO 3 CS PO QHS 03/14/16   Historical Provider, MD  omeprazole (PRILOSEC) 20 MG capsule Take 1 capsule (20 mg total) by mouth daily. 07/02/16   Vickki Hearing, MD  promethazine (PHENERGAN) 25 MG tablet Take 1 tablet (25 mg total) by mouth every 6 (six) hours as needed for nausea or vomiting. 08/22/15   Vickki Hearing, MD    Family History Family History  Problem Relation Age of Onset  . COPD Mother   . Arthritis Mother   . Diabetes Father   . Heart disease Father   .  Hypertension Brother   . Diabetes Paternal Grandmother   . Diabetes Maternal Grandmother   . Dementia Paternal Grandfather     Social History Social History  Substance Use Topics  . Smoking status: Current Some Day Smoker    Packs/day: 0.50    Years: 30.00    Types: Cigarettes  . Smokeless tobacco: Never Used  . Alcohol use Yes     Comment: occ     Allergies   Codeine and Sulfa antibiotics   Review of Systems Review of Systems  Constitutional: Negative for fever.  Gastrointestinal: Positive for nausea.     Physical Exam Updated Vital Signs BP 123/59 (BP Location: Left Arm)   Pulse 71   Temp 97.9 F (36.6 C) (Oral)   Resp 17   Ht 5\' 9"  (1.753 m)   Wt 102.5 kg   LMP 12/04/2016 (Approximate)   SpO2 99%   BMI 33.37 kg/m   Physical Exam CONSTITUTIONAL: Well developed/well nourished HEAD: Normocephalic/atraumatic EYES: EOMI ENMT: Mucous membranes moist NECK: supple no meningeal signs CV: S1/S2 noted, no murmurs/rubs/gallops noted LUNGS: Lungs are clear to auscultation bilaterally, no apparent distress ABDOMEN: soft, nontender NEURO: Pt is awake/alert/appropriate, moves all extremitiesx4.  No facial droop.   EXTREMITIES: pulses normal/equal, full ROM, see photo below, no crepitus noted SKIN: warm, color normal, see photo below PSYCH: no abnormalities of mood noted, alert and oriented to situation     ED Treatments / Results  Labs (all labs ordered are listed, but only abnormal results are displayed) Labs Reviewed - No data to display  EKG  EKG Interpretation None       Radiology No results found.  Procedures Procedures ( INCISION AND DRAINAGE Performed by: Joya Gaskins Consent: Verbal consent obtained. Risks and benefits: risks, benefits and alternatives were discussed Type: abscess  Body area: LEFT FOREARM  Anesthesia: local infiltration  Incision was made with a scalpel.  Local anesthetic: lidocaine % WITH  epinephrine  Anesthetic total: 3 ml  Complexity: complex Blunt dissection to break up loculations  Drainage: purulent  Drainage amount: MINIMAL  Patient tolerance: Patient tolerated the procedure well with no immediate complications.     Medications Ordered in ED Medications  HYDROcodone-acetaminophen (NORCO/VICODIN) 5-325 MG per tablet 1 tablet (1 tablet Oral Given 12/18/16 0620)  doxycycline (VIBRA-TABS) tablet 100 mg (100 mg Oral Given 12/18/16 0620)  lidocaine-EPINEPHrine (XYLOCAINE-EPINEPHrine) 1 %-1:200000 (PF) injection 10 mL (10 mLs Other Given 12/18/16 0620)     Initial Impression / Assessment and Plan / ED Course  I have reviewed the triage vital signs and the nursing notes.   Clinical Course     PT TOLERATED WELL SHE IS WELL APPEARING/NONTOXIC WILL START DOXYCYCLINE WE DISCUSSED STRICT RETURN PRECAUTIONS   Final Clinical Impressions(s) / ED Diagnoses   Final diagnoses:  Abscess    New Prescriptions Discharge Medication List as of 12/18/2016  6:51 AM  START taking these medications   Details  doxycycline (VIBRAMYCIN) 100 MG capsule Take 1 capsule (100 mg total) by mouth 2 (two) times daily. One po bid x 7 days, Starting Wed 12/18/2016, Print         Zadie Rhine, MD 12/18/16 [phone]

## 2016-12-18 NOTE — ED Notes (Signed)
ED Provider at bedside. 

## 2016-12-18 NOTE — ED Triage Notes (Signed)
Pt states "I have a spider bite" that came up Sunday evening

## 2016-12-21 ENCOUNTER — Other Ambulatory Visit: Payer: Self-pay | Admitting: Orthopedic Surgery

## 2017-05-14 ENCOUNTER — Emergency Department (HOSPITAL_COMMUNITY): Payer: Self-pay

## 2017-05-14 ENCOUNTER — Encounter (HOSPITAL_COMMUNITY): Payer: Self-pay | Admitting: Emergency Medicine

## 2017-05-14 ENCOUNTER — Emergency Department (HOSPITAL_COMMUNITY)
Admission: EM | Admit: 2017-05-14 | Discharge: 2017-05-14 | Disposition: A | Discharge status: Home / Self Care | Payer: Self-pay | Attending: Emergency Medicine | Admitting: Emergency Medicine

## 2017-05-14 DIAGNOSIS — R739 Hyperglycemia, unspecified: Secondary | ICD-10-CM | POA: Insufficient documentation

## 2017-05-14 DIAGNOSIS — F17210 Nicotine dependence, cigarettes, uncomplicated: Secondary | ICD-10-CM | POA: Insufficient documentation

## 2017-05-14 DIAGNOSIS — Z79899 Other long term (current) drug therapy: Secondary | ICD-10-CM | POA: Insufficient documentation

## 2017-05-14 DIAGNOSIS — R590 Localized enlarged lymph nodes: Secondary | ICD-10-CM | POA: Insufficient documentation

## 2017-05-14 LAB — CBC WITH DIFFERENTIAL/PLATELET
BASOS ABS: 0 10*3/uL (ref 0.0–0.1)
Basophils Relative: 0 %
EOS PCT: 1 %
Eosinophils Absolute: 0.1 10*3/uL (ref 0.0–0.7)
HCT: 46 % (ref 36.0–46.0)
Hemoglobin: 15.7 g/dL — ABNORMAL HIGH (ref 12.0–15.0)
LYMPHS ABS: 2.8 10*3/uL (ref 0.7–4.0)
Lymphocytes Relative: 27 %
MCH: 31.9 pg (ref 26.0–34.0)
MCHC: 34.1 g/dL (ref 30.0–36.0)
MCV: 93.5 fL (ref 78.0–100.0)
MONO ABS: 0.6 10*3/uL (ref 0.1–1.0)
MONOS PCT: 6 %
Neutro Abs: 7 10*3/uL (ref 1.7–7.7)
Neutrophils Relative %: 66 %
PLATELETS: 210 10*3/uL (ref 150–400)
RBC: 4.92 MIL/uL (ref 3.87–5.11)
RDW: 13.5 % (ref 11.5–15.5)
WBC: 10.6 10*3/uL — ABNORMAL HIGH (ref 4.0–10.5)

## 2017-05-14 LAB — BASIC METABOLIC PANEL
Anion gap: 9 (ref 5–15)
BUN: 9 mg/dL (ref 6–20)
CALCIUM: 9.6 mg/dL (ref 8.9–10.3)
CO2: 26 mmol/L (ref 22–32)
CREATININE: 0.63 mg/dL (ref 0.44–1.00)
Chloride: 102 mmol/L (ref 101–111)
GFR calc Af Amer: >60 mL/min (ref >60)
GLUCOSE: 230 mg/dL — AB (ref 65–99)
Potassium: 3.5 mmol/L (ref 3.5–5.1)
Sodium: 137 mmol/L (ref 135–145)

## 2017-05-14 LAB — POC URINE PREG, ED: Preg Test, Ur: NEGATIVE

## 2017-05-14 MED ORDER — CEPHALEXIN 500 MG PO CAPS: 1000.0000 mg | ORAL_CAPSULE | Freq: Two times a day (BID) | ORAL | 0 refills | Status: DC

## 2017-05-14 MED ORDER — AMOXICILLIN 500 MG PO CAPS: 500.0000 mg | ORAL_CAPSULE | Freq: Three times a day (TID) | ORAL | 0 refills | Status: DC

## 2017-05-14 MED ORDER — MORPHINE SULFATE (PF) 4 MG/ML IV SOLN
4.0000 mg | Freq: Once | INTRAVENOUS | Status: AC
  Administered 2017-05-14: 4 mg via INTRAVENOUS
  Filled 2017-05-14: qty 1

## 2017-05-14 MED ORDER — HYDROCODONE-ACETAMINOPHEN 5-325 MG PO TABS: 1.0000 | ORAL_TABLET | ORAL | 0 refills | Status: DC | PRN

## 2017-05-14 MED ORDER — IOPAMIDOL (ISOVUE-300) INJECTION 61%
75.0000 mL | Freq: Once | INTRAVENOUS | Status: AC | PRN
  Administered 2017-05-14: 75 mL via INTRAVENOUS

## 2017-05-14 MED ORDER — KETOROLAC TROMETHAMINE 30 MG/ML IJ SOLN
30.0000 mg | Freq: Once | INTRAMUSCULAR | Status: AC
  Administered 2017-05-14: 30 mg via INTRAVENOUS
  Filled 2017-05-14: qty 1

## 2017-05-14 NOTE — ED Triage Notes (Signed)
Pt reports sore throat, nasal drainage, cough, and soreness in neck x3 days.

## 2017-05-14 NOTE — ED Provider Notes (Signed)
[facility] Provider Note   CSN: [id number] Arrival date & time: 05/14/17  1426     History   Chief Complaint Chief Complaint  Patient presents with  . Sore Throat    HPI Alexandria Mejia is a 46 y.o. female presenting with a 3 day history of neck and jaw pain which started 3 days ago and has been associated with subjective fever, swelling beneath the jawline non productive cough and clear nasal discharge.  She reports the pain started beneath her left jaw and has progressed to pain that radiates along her left cheek and ear.  She reports a fullness sensation in her throat with attempts to swallow and is having difficulty opening her mouth fully.  She tried to eat soup today, but was only able to tolerate the broth.  She denies dental pain or gingival swelling.  Her fevers have been subjective.  She denies wheezing, stridor, headache or neck pain.  She has taken ibuprofen 800 mg early this am without relief of symptoms.  HPI  Past Medical History:  Diagnosis Date  . Back pain   . Complication of anesthesia    patient states "with last surgery I was hard to wake up".  . GERD (gastroesophageal reflux disease)   . Migraine     Patient Active Problem List   Diagnosis Date Noted  . H/O repair of right rotator cuff 10/24/2014  . Partial tear of rotator cuff 03/01/2014  . Shoulder injury 02/14/2014  . Rotator cuff tear, right 02/14/2014  . Routine general medical examination at a health care facility 09/17/2012  . Shoulder pain 09/17/2012  . Stress 09/17/2012  . Candidiasis, intertrigo 09/17/2012  . Obesity, unspecified 08/04/2012  . Chronic back pain 08/04/2012  . Nausea 08/04/2012  . Tobacco user 08/04/2012    Past Surgical History:  Procedure Laterality Date  . APPENDECTOMY    . BACK SURGERY     lumbar-herniated disc  . CHOLECYSTECTOMY    . FLEXOR TENOTOMY Right 10/12/2014   Procedure: BICEPS TENOTOMY;  Surgeon: Vickki HearingStanley E Harrison, MD;  Location: [facility];   Service: Orthopedics;  Laterality: Right;  . NECK SURGERY     decompression of 7 disc  . SHOULDER ARTHROSCOPY WITH ROTATOR CUFF REPAIR Right 05/27/2014   Procedure: SHOULDER ARTHROSCOPY WITH ROTATOR CUFF REPAIR, subscalpularis repair, open supraspinatus repair;  Surgeon: Vickki HearingStanley E Harrison, MD;  Location: [facility];  Service: Orthopedics;  Laterality: Right;  . SHOULDER OPEN ROTATOR CUFF REPAIR Right 10/12/2014   Procedure: OPEN ROTATOR CUFF REPAIR SHOULDER;  Surgeon: Vickki HearingStanley E Harrison, MD;  Location: [facility];  Service: Orthopedics;  Laterality: Right;  . ULNAR NERVE TRANSPOSITION Right 11/13/2015   Procedure: RIGHT ULNAR NERVE TRANSPOSITION;  Surgeon: Mack Hookavid Thompson, MD;  Location: [facility];  Service: Orthopedics;  Laterality: Right;    OB History    No data available       Home Medications    Prior to Admission medications   Medication Sig Start Date End Date Taking? Authorizing Provider  ibuprofen (ADVIL,MOTRIN) 800 MG tablet TAKE 1 TABLET(800 MG) BY MOUTH THREE TIMES DAILY 12/22/16  Yes Vickki HearingHarrison, Stanley E, MD  omeprazole (PRILOSEC) 20 MG capsule Take 1 capsule (20 mg total) by mouth daily. 07/02/16  Yes Vickki HearingHarrison, Stanley E, MD  cephALEXin (KEFLEX) 500 MG capsule Take 2 capsules (1,000 mg total) by mouth 2 (two) times daily. 05/14/17   Burgess AmorIdol, Tineshia Becraft, PA-C  HYDROcodone-acetaminophen (NORCO/VICODIN) 5-325 MG tablet Take 1 tablet by mouth every 4 (four)  hours as needed. 05/14/17   Burgess Amor, PA-C    Family History Family History  Problem Relation Age of Onset  . COPD Mother   . Arthritis Mother   . Diabetes Father   . Heart disease Father   . Hypertension Brother   . Diabetes Paternal Grandmother   . Diabetes Maternal Grandmother   . Dementia Paternal Grandfather     Social History Social History  Substance Use Topics  . Smoking status: Current Some Day Smoker    Packs/day: 0.50    Years: 30.00    Types: Cigarettes  . Smokeless tobacco: Never Used  . Alcohol  use Yes     Comment: occ     Allergies   Codeine and Sulfa antibiotics   Review of Systems Review of Systems  Constitutional: Positive for fever.  HENT: Positive for ear pain, sore throat and trouble swallowing. Negative for congestion.        Negative except as mentioned in HPI.   Eyes: Negative.   Respiratory: Negative for chest tightness and shortness of breath.   Cardiovascular: Negative for chest pain.  Gastrointestinal: Negative for abdominal pain, nausea and vomiting.  Genitourinary: Negative.   Musculoskeletal: Negative for arthralgias and joint swelling.  Skin: Negative.  Negative for rash and wound.  Neurological: Negative for dizziness, weakness, light-headedness, numbness and headaches.  Psychiatric/Behavioral: Negative.      Physical Exam Updated Vital Signs BP 131/83 (BP Location: Right Arm)   Pulse 84   Temp 97.9 F (36.6 C) (Oral)   Resp 18   Ht 5\' 9"  (1.753 m)   Wt 99.8 kg (220 lb)   LMP 04/14/2017 (Approximate)   SpO2 95%   BMI 32.49 kg/m   Physical Exam  Constitutional: She appears well-developed and well-nourished.  Appears uncomfortable  HENT:  Head: Normocephalic and atraumatic.  Right Ear: Tympanic membrane, external ear and ear canal normal. No tenderness.  Left Ear: Hearing, external ear and ear canal normal. There is tenderness. No mastoid tenderness. Tympanic membrane is not injected, not scarred and not erythematous. A middle ear effusion is present.  Nose: Nose normal.  Mouth/Throat: Oropharynx is clear and moist. There is trismus in the jaw. No uvula swelling. No posterior oropharyngeal edema or posterior oropharyngeal erythema. No tonsillar exudate.  ttp submandibular space with soft edema, no induration or erythema.  Tenderness extends across the left lateral jaw, cheek and to the preauricular space.  No parotid gland enlargement.  No sublingual erythema, Whartons ducts without edema or purulent drainage. Left parotid duct opening is   enlarged without erythema or purulent discharge. No palpable masses.  Eyes: Conjunctivae are normal.  Neck: Normal range of motion.  Cardiovascular: Normal rate, regular rhythm, normal heart sounds and intact distal pulses.   Pulmonary/Chest: Effort normal and breath sounds normal. No stridor. She has no wheezes.  Abdominal: Soft. Bowel sounds are normal. There is no tenderness.  Musculoskeletal: Normal range of motion.  Lymphadenopathy:       Head (right side): No submental and no submandibular adenopathy present.       Head (left side): Submandibular adenopathy present. No submental adenopathy present.    She has no cervical adenopathy.  Neurological: She is alert.  Skin: Skin is warm and dry.  Psychiatric: She has a normal mood and affect.  Nursing note and vitals reviewed.    ED Treatments / Results  Labs (all labs ordered are listed, but only abnormal results are displayed) Labs Reviewed  CBC WITH DIFFERENTIAL/PLATELET -  Abnormal; Notable for the following:       Result Value   WBC 10.6 (*)    Hemoglobin 15.7 (*)    All other components within normal limits  BASIC METABOLIC PANEL - Abnormal; Notable for the following:    Glucose, Bld 230 (*)    All other components within normal limits  POC URINE PREG, ED    EKG  EKG Interpretation None       Radiology Ct Soft Tissue Neck W Contrast  Result Date: 05/14/2017 CLINICAL DATA:  LEFT submandibular pain and swelling.  L4 EXAM: CT NECK WITH CONTRAST TECHNIQUE: Multidetector CT imaging of the neck was performed using the standard protocol following the bolus administration of intravenous contrast. CONTRAST:  75mL ISOVUE-300 IOPAMIDOL (ISOVUE-300) INJECTION 61% COMPARISON:  None. FINDINGS: Pharynx and larynx: Normal. No mass or swelling. Salivary glands: No inflammation, mass, or stone. Thyroid: Normal. Lymph nodes: Level 1 and level 2 lymph nodes are enlarged, LEFT greater than RIGHT, and appear reactive. A level 1 lymph node  on the LEFT, close to the area of concern, measures 10 mm short axis. No level 4, level 5, axillary, or mediastinal nodes are seen. Vascular: Negative. Limited intracranial: Negative. Visualized orbits: Negative. Mastoids and visualized paranasal sinuses: No air-fluid levels. Large retention cyst RIGHT maxillary sinus. Skeleton: Mild spondylosis. Upper chest: Negative. Other: None. IMPRESSION: Suspected reactive cervical adenopathy, greater on the LEFT. No submandibular duct stone or inflammation. Electronically Signed   By: Elsie Stain M.D.   On: 05/14/2017 18:10    Procedures Procedures (including critical care time)  Medications Ordered in ED Medications  ketorolac (TORADOL) 30 MG/ML injection 30 mg (30 mg Intravenous Given 05/14/17 1634)  morphine 4 MG/ML injection 4 mg (4 mg Intravenous Given 05/14/17 1728)  iopamidol (ISOVUE-300) 61 % injection 75 mL (75 mLs Intravenous Contrast Given 05/14/17 1742)     Initial Impression / Assessment and Plan / ED Course  I have reviewed the triage vital signs and the nursing notes.  Pertinent labs & imaging results that were available during my care of the patient were reviewed by me and considered in my medical decision making (see chart for details).     Discussed results with patient including elevated glucose level. She was prescribed keflex, hydrocodone, will take her home ibuprofen and warm compresses to site of lymphadenopathy. Advised recheck by pcp and need for further testing including fasting blood glucose level.  She will f/u with her pcp as discussed.    Final Clinical Impressions(s) / ED Diagnoses   Final diagnoses:  Lymphadenopathy, submandibular  Hyperglycemia    New Prescriptions New Prescriptions   CEPHALEXIN (KEFLEX) 500 MG CAPSULE    Take 2 capsules (1,000 mg total) by mouth 2 (two) times daily.   HYDROCODONE-ACETAMINOPHEN (NORCO/VICODIN) 5-325 MG TABLET    Take 1 tablet by mouth every 4 (four) hours as needed.     Burgess Amor, PA-C 05/14/17 1845    Samuel Jester, DO 05/16/17 2324

## 2017-05-14 NOTE — Discharge Instructions (Signed)
Your are being treated with antibiotics.  Make sure you take the entire course.  Apply warm compresses to your neck and cheek area several times daily.  Continue taking your ibuprofen 800 mg tablets -take one tablet every 8 hours.  You may take the hydrocodone prescribed for pain relief.  This will make you drowsy - do not drive within 4 hours of taking this medication.  Your blood glucose is elevated today at 230, which technically, may mean that you have diabetes. You should follow up with your doctor for further testing including a fasting blood glucose to determine if you have this condition.

## 2017-05-14 NOTE — ED Notes (Signed)
POC urine pregnancy test resulted NEGATIVE. 

## 2017-08-04 ENCOUNTER — Encounter (HOSPITAL_COMMUNITY): Payer: Self-pay | Admitting: Emergency Medicine

## 2017-08-04 ENCOUNTER — Emergency Department (HOSPITAL_COMMUNITY)
Admission: EM | Admit: 2017-08-04 | Discharge: 2017-08-04 | Disposition: A | Discharge status: Home / Self Care | Payer: Self-pay | Attending: Emergency Medicine | Admitting: Emergency Medicine

## 2017-08-04 DIAGNOSIS — Z79899 Other long term (current) drug therapy: Secondary | ICD-10-CM | POA: Insufficient documentation

## 2017-08-04 DIAGNOSIS — Y999 Unspecified external cause status: Secondary | ICD-10-CM | POA: Insufficient documentation

## 2017-08-04 DIAGNOSIS — W268XXA Contact with other sharp object(s), not elsewhere classified, initial encounter: Secondary | ICD-10-CM | POA: Insufficient documentation

## 2017-08-04 DIAGNOSIS — S61412A Laceration without foreign body of left hand, initial encounter: Secondary | ICD-10-CM | POA: Insufficient documentation

## 2017-08-04 DIAGNOSIS — F17210 Nicotine dependence, cigarettes, uncomplicated: Secondary | ICD-10-CM | POA: Insufficient documentation

## 2017-08-04 DIAGNOSIS — Y939 Activity, unspecified: Secondary | ICD-10-CM | POA: Insufficient documentation

## 2017-08-04 DIAGNOSIS — Y929 Unspecified place or not applicable: Secondary | ICD-10-CM | POA: Insufficient documentation

## 2017-08-04 MED ORDER — POVIDONE-IODINE 10 % EX SOLN
CUTANEOUS | Status: AC
  Filled 2017-08-04: qty 15

## 2017-08-04 MED ORDER — LIDOCAINE HCL (PF) 1 % IJ SOLN
INTRAMUSCULAR | Status: AC
  Filled 2017-08-04: qty 5

## 2017-08-04 MED ORDER — CEPHALEXIN 500 MG PO CAPS: 500.0000 mg | ORAL_CAPSULE | Freq: Four times a day (QID) | ORAL | 0 refills | Status: DC

## 2017-08-04 MED ORDER — TRAMADOL HCL 50 MG PO TABS: 50.0000 mg | ORAL_TABLET | Freq: Four times a day (QID) | ORAL | 0 refills | Status: DC | PRN

## 2017-08-04 NOTE — ED Triage Notes (Signed)
Patient states she was working on a pipe with a razor blade and it slipped cutting her left hand. Patient has laceration noted to left palm. Bleeding controlled at this time.

## 2017-08-04 NOTE — Discharge Instructions (Signed)
Have your sutures removed in 10 days.  Keep your wound clean and dry,  Until a good scab forms - you may then wash gently twice daily with mild soap and water, but dry completely after.  Get rechecked for any sign of infection (redness,  Swelling,  Increased pain or drainage of purulent fluid). ° °

## 2017-08-06 NOTE — ED Provider Notes (Signed)
[facility] Provider Note   CSN: [id number] Arrival date & time: 08/04/17  1505     History   Chief Complaint Chief Complaint  Patient presents with  . Laceration    HPI Alexandria Mejia is a 46 y.o. female.  The history is provided by the patient.  Laceration   The incident occurred less than 1 hour ago. The laceration is located on the left hand. The laceration is 2 cm in size. The laceration mechanism was a a razor. The pain is at a severity of 3/10. The pain has been constant since onset. She reports no foreign bodies present. Her tetanus status is UTD.    Past Medical History:  Diagnosis Date  . Back pain   . Complication of anesthesia    patient states "with last surgery I was hard to wake up".  . GERD (gastroesophageal reflux disease)   . Migraine     Patient Active Problem List   Diagnosis Date Noted  . H/O repair of right rotator cuff 10/24/2014  . Partial tear of rotator cuff 03/01/2014  . Shoulder injury 02/14/2014  . Rotator cuff tear, right 02/14/2014  . Routine general medical examination at a health care facility 09/17/2012  . Shoulder pain 09/17/2012  . Stress 09/17/2012  . Candidiasis, intertrigo 09/17/2012  . Obesity, unspecified 08/04/2012  . Chronic back pain 08/04/2012  . Nausea 08/04/2012  . Tobacco user 08/04/2012    Past Surgical History:  Procedure Laterality Date  . APPENDECTOMY    . BACK SURGERY     lumbar-herniated disc  . CHOLECYSTECTOMY    . FLEXOR TENOTOMY Right 10/12/2014   Procedure: BICEPS TENOTOMY;  Surgeon: Vickki Hearing, MD;  Location: [facility];  Service: Orthopedics;  Laterality: Right;  . NECK SURGERY     decompression of 7 disc  . SHOULDER ARTHROSCOPY WITH ROTATOR CUFF REPAIR Right 05/27/2014   Procedure: SHOULDER ARTHROSCOPY WITH ROTATOR CUFF REPAIR, subscalpularis repair, open supraspinatus repair;  Surgeon: Vickki Hearing, MD;  Location: [facility];  Service: Orthopedics;  Laterality: Right;  .  SHOULDER OPEN ROTATOR CUFF REPAIR Right 10/12/2014   Procedure: OPEN ROTATOR CUFF REPAIR SHOULDER;  Surgeon: Vickki Hearing, MD;  Location: [facility];  Service: Orthopedics;  Laterality: Right;  . ULNAR NERVE TRANSPOSITION Right 11/13/2015   Procedure: RIGHT ULNAR NERVE TRANSPOSITION;  Surgeon: Mack Hook, MD;  Location: [facility];  Service: Orthopedics;  Laterality: Right;    OB History    No data available       Home Medications    Prior to Admission medications   Medication Sig Start Date End Date Taking? Authorizing Provider  cephALEXin (KEFLEX) 500 MG capsule Take 1 capsule (500 mg total) by mouth 4 (four) times daily. 08/04/17   Burgess Amor, PA-C  HYDROcodone-acetaminophen (NORCO/VICODIN) 5-325 MG tablet Take 1 tablet by mouth every 4 (four) hours as needed. 05/14/17   Burgess Amor, PA-C  ibuprofen (ADVIL,MOTRIN) 800 MG tablet TAKE 1 TABLET(800 MG) BY MOUTH THREE TIMES DAILY 12/22/16   Vickki Hearing, MD  omeprazole (PRILOSEC) 20 MG capsule Take 1 capsule (20 mg total) by mouth daily. 07/02/16   Vickki Hearing, MD  traMADol (ULTRAM) 50 MG tablet Take 1 tablet (50 mg total) by mouth every 6 (six) hours as needed. 08/04/17   Burgess Amor, PA-C    Family History Family History  Problem Relation Age of Onset  . COPD Mother   . Arthritis Mother   . Diabetes Father   .  Heart disease Father   . Hypertension Brother   . Diabetes Paternal Grandmother   . Diabetes Maternal Grandmother   . Dementia Paternal Grandfather     Social History Social History  Substance Use Topics  . Smoking status: Current Some Day Smoker    Packs/day: 0.50    Years: 30.00    Types: Cigarettes  . Smokeless tobacco: Never Used  . Alcohol use Yes     Comment: occ     Allergies   Codeine and Sulfa antibiotics   Review of Systems Review of Systems  Constitutional: Negative.   Respiratory: Negative.   Skin: Positive for wound.  Neurological: Negative for weakness and  numbness.     Physical Exam Updated Vital Signs BP 109/69 (BP Location: Right Arm)   Pulse 88   Temp 98 F (36.7 C) (Oral)   Resp 18   Ht 5\' 9"  (1.753 m)   Wt 99.8 kg (220 lb)   LMP 07/04/2017   SpO2 97%   BMI 32.49 kg/m   Physical Exam  Constitutional: She is oriented to person, place, and time. She appears well-developed and well-nourished.  HENT:  Head: Normocephalic.  Cardiovascular: Normal rate.   Pulmonary/Chest: Effort normal.  Musculoskeletal: She exhibits no tenderness.  Neurological: She is alert and oriented to person, place, and time. No sensory deficit.  Skin: Laceration noted.  2 cm hemostatic, subcutaneous laceration left volar hand, thenar eminence.     ED Treatments / Results  Labs (all labs ordered are listed, but only abnormal results are displayed) Labs Reviewed - No data to display  EKG  EKG Interpretation None       Radiology No results found.  Procedures Procedures (including critical care time)  LACERATION REPAIR Performed by: Burgess Amor Authorized by: Burgess Amor Consent: Verbal consent obtained. Risks and benefits: risks, benefits and alternatives were discussed Consent given by: patient Patient identity confirmed: provided demographic data Prepped and Draped in normal sterile fashion Wound explored  Laceration Location:left hand  Laceration Length: 2cm  No Foreign Bodies seen or palpated  Anesthesia: local infiltration  Local anesthetic: lidocaine 1% without epinephrine  Anesthetic total: 3 ml  Irrigation method: syringe Amount of cleaning: standard  Skin closure: ethilon 4-0  Number of sutures: 4  Technique: simple interupted  Patient tolerance: Patient tolerated the procedure well with no immediate complications.   Medications Ordered in ED Medications - No data to display   Initial Impression / Assessment and Plan / ED Course  I have reviewed the triage vital signs and the nursing  notes.  Pertinent labs & imaging results that were available during my care of the patient were reviewed by me and considered in my medical decision making (see chart for details).     Wound care instructions given.  Pt advised to have sutures removed in 10 days,  Return here sooner for any signs of infection including redness, swelling, worse pain or drainage of pus.     Final Clinical Impressions(s) / ED Diagnoses   Final diagnoses:  Laceration of left hand without foreign body, initial encounter    New Prescriptions Discharge Medication List as of 08/04/2017  4:17 PM    START taking these medications   Details  traMADol (ULTRAM) 50 MG tablet Take 1 tablet (50 mg total) by mouth every 6 (six) hours as needed., Starting Mon 08/04/2017, Print         Burgess Amor, PA-C 08/06/17 2153    Linwood Dibbles, MD 08/06/17 2208

## 2017-11-15 ENCOUNTER — Encounter (HOSPITAL_COMMUNITY): Payer: Self-pay | Admitting: Emergency Medicine

## 2017-11-15 ENCOUNTER — Emergency Department (HOSPITAL_COMMUNITY)
Admission: EM | Admit: 2017-11-15 | Discharge: 2017-11-15 | Disposition: A | Discharge status: Home / Self Care | Payer: Self-pay | Attending: Emergency Medicine | Admitting: Emergency Medicine

## 2017-11-15 ENCOUNTER — Emergency Department (HOSPITAL_COMMUNITY): Payer: Self-pay

## 2017-11-15 ENCOUNTER — Other Ambulatory Visit: Payer: Self-pay

## 2017-11-15 DIAGNOSIS — F17210 Nicotine dependence, cigarettes, uncomplicated: Secondary | ICD-10-CM | POA: Insufficient documentation

## 2017-11-15 DIAGNOSIS — W19XXXA Unspecified fall, initial encounter: Secondary | ICD-10-CM | POA: Insufficient documentation

## 2017-11-15 DIAGNOSIS — S322XXA Fracture of coccyx, initial encounter for closed fracture: Secondary | ICD-10-CM | POA: Insufficient documentation

## 2017-11-15 DIAGNOSIS — Y998 Other external cause status: Secondary | ICD-10-CM | POA: Insufficient documentation

## 2017-11-15 DIAGNOSIS — Z79899 Other long term (current) drug therapy: Secondary | ICD-10-CM | POA: Insufficient documentation

## 2017-11-15 DIAGNOSIS — Y9389 Activity, other specified: Secondary | ICD-10-CM | POA: Insufficient documentation

## 2017-11-15 DIAGNOSIS — Y929 Unspecified place or not applicable: Secondary | ICD-10-CM | POA: Insufficient documentation

## 2017-11-15 LAB — I-STAT BETA HCG BLOOD, ED (MC, WL, AP ONLY): I-stat hCG, quantitative: <5 m[IU]/mL (ref <5)

## 2017-11-15 MED ORDER — DOCUSATE SODIUM 100 MG PO CAPS: 100.0000 mg | ORAL_CAPSULE | Freq: Two times a day (BID) | ORAL | 0 refills | Status: DC

## 2017-11-15 MED ORDER — KETOROLAC TROMETHAMINE 60 MG/2ML IM SOLN
30.0000 mg | Freq: Once | INTRAMUSCULAR | Status: AC
  Administered 2017-11-15: 30 mg via INTRAMUSCULAR
  Filled 2017-11-15: qty 2

## 2017-11-15 MED ORDER — HYDROCODONE-ACETAMINOPHEN 5-325 MG PO TABS
1.0000 | ORAL_TABLET | Freq: Once | ORAL | Status: AC
  Administered 2017-11-15: 1 via ORAL
  Filled 2017-11-15: qty 1

## 2017-11-15 MED ORDER — HYDROCODONE-ACETAMINOPHEN 5-325 MG PO TABS: 1.0000 | ORAL_TABLET | ORAL | 0 refills | Status: DC | PRN

## 2017-11-15 NOTE — ED Provider Notes (Signed)
[facility] EMERGENCY DEPARTMENT Provider Note   CSN: [id number] Arrival date & time: 11/15/17  [id number]     History   Chief Complaint Chief Complaint  Patient presents with  . Back Pain    HPI Pete PeltMichelle M Loiselle is a 46 y.o. female.   Back Pain   This is a new problem. The current episode started 3 to 5 hours ago. The problem occurs constantly. The problem has not changed since onset.The pain is associated with no known injury. The pain is mild. The symptoms are aggravated by certain positions. Pertinent negatives include no chest pain, no abdominal swelling, no bowel incontinence, no dysuria and no pelvic pain. She has tried nothing for the symptoms. The treatment provided no relief.    Past Medical History:  Diagnosis Date  . Back pain   . Complication of anesthesia    patient states "with last surgery I was hard to wake up".  . GERD (gastroesophageal reflux disease)   . Migraine     Patient Active Problem List   Diagnosis Date Noted  . H/O repair of right rotator cuff 10/24/2014  . Partial tear of rotator cuff 03/01/2014  . Shoulder injury 02/14/2014  . Rotator cuff tear, right 02/14/2014  . Routine general medical examination at a health care facility 09/17/2012  . Shoulder pain 09/17/2012  . Stress 09/17/2012  . Candidiasis, intertrigo 09/17/2012  . Obesity, unspecified 08/04/2012  . Chronic back pain 08/04/2012  . Nausea 08/04/2012  . Tobacco user 08/04/2012    Past Surgical History:  Procedure Laterality Date  . APPENDECTOMY    . BACK SURGERY     lumbar-herniated disc  . CHOLECYSTECTOMY    . FLEXOR TENOTOMY Right 10/12/2014   Procedure: BICEPS TENOTOMY;  Surgeon: Vickki HearingStanley E Harrison, MD;  Location: [facility];  Service: Orthopedics;  Laterality: Right;  . NECK SURGERY     decompression of 7 disc  . SHOULDER ARTHROSCOPY WITH ROTATOR CUFF REPAIR Right 05/27/2014   Procedure: SHOULDER ARTHROSCOPY WITH ROTATOR CUFF REPAIR, subscalpularis repair, open  supraspinatus repair;  Surgeon: Vickki HearingStanley E Harrison, MD;  Location: [facility];  Service: Orthopedics;  Laterality: Right;  . SHOULDER OPEN ROTATOR CUFF REPAIR Right 10/12/2014   Procedure: OPEN ROTATOR CUFF REPAIR SHOULDER;  Surgeon: Vickki HearingStanley E Harrison, MD;  Location: [facility];  Service: Orthopedics;  Laterality: Right;  . ULNAR NERVE TRANSPOSITION Right 11/13/2015   Procedure: RIGHT ULNAR NERVE TRANSPOSITION;  Surgeon: Mack Hookavid Thompson, MD;  Location: [facility];  Service: Orthopedics;  Laterality: Right;    OB History    No data available       Home Medications    Prior to Admission medications   Medication Sig Start Date End Date Taking? Authorizing Provider  ibuprofen (ADVIL,MOTRIN) 800 MG tablet TAKE 1 TABLET(800 MG) BY MOUTH THREE TIMES DAILY 12/22/16  Yes Vickki HearingHarrison, Stanley E, MD  omeprazole (PRILOSEC) 20 MG capsule Take 1 capsule (20 mg total) by mouth daily. 07/02/16  Yes Vickki HearingHarrison, Stanley E, MD  traMADol (ULTRAM) 50 MG tablet Take 1 tablet (50 mg total) by mouth every 6 (six) hours as needed. 08/04/17  Yes Idol, Raynelle FanningJulie, PA-C  docusate sodium (COLACE) 100 MG capsule Take 1 capsule (100 mg total) by mouth every 12 (twelve) hours. While on pain meds. 11/15/17   Abou Sterkel, Barbara CowerJason, MD  HYDROcodone-acetaminophen (NORCO/VICODIN) 5-325 MG tablet Take 1 tablet by mouth every 4 (four) hours as needed. 11/15/17   Dariana Garbett, Barbara CowerJason, MD    Family History Family History  Problem  Relation Age of Onset  . COPD Mother   . Arthritis Mother   . Diabetes Father   . Heart disease Father   . Hypertension Brother   . Diabetes Paternal Grandmother   . Diabetes Maternal Grandmother   . Dementia Paternal Grandfather     Social History Social History   Tobacco Use  . Smoking status: Current Some Day Smoker    Packs/day: 0.50    Years: 30.00    Pack years: 15.00    Types: Cigarettes  . Smokeless tobacco: Never Used  Substance Use Topics  . Alcohol use: Yes    Comment: occ  . Drug use: No      Allergies   Codeine and Sulfa antibiotics   Review of Systems Review of Systems  Cardiovascular: Negative for chest pain.  Gastrointestinal: Negative for bowel incontinence.  Genitourinary: Negative for dysuria and pelvic pain.  Musculoskeletal: Positive for back pain.  All other systems reviewed and are negative.    Physical Exam Updated Vital Signs BP 111/65   Pulse 65   Temp 97.6 F (36.4 C) (Oral)   Resp 18   Ht 5\' 9"  (1.753 m)   Wt 99.8 kg (220 lb)   LMP 11/10/2017   SpO2 97%   BMI 32.49 kg/m   Physical Exam  Constitutional: She appears well-developed and well-nourished.  HENT:  Head: Normocephalic and atraumatic.  Eyes: Conjunctivae and EOM are normal.  Neck: Normal range of motion.  Cardiovascular: Normal rate and regular rhythm.  Pulmonary/Chest: Effort normal. No stridor. No respiratory distress.  Abdominal: Soft. She exhibits no distension.  Musculoskeletal: She exhibits tenderness (over lower midline back).  Neurological: She is alert.  Skin: Skin is warm and dry.  Nursing note and vitals reviewed.    ED Treatments / Results  Labs (all labs ordered are listed, but only abnormal results are displayed) Labs Reviewed  I-STAT BETA HCG BLOOD, ED ([facility], [facility], [facility] ONLY)    EKG  EKG Interpretation None       Radiology Dg Lumbar Spine Complete  Result Date: 11/15/2017 CLINICAL DATA:  Low back pain, recent injury, pain EXAM: LUMBAR SPINE - COMPLETE 4+ VIEW COMPARISON:  09/22/2016 FINDINGS: Normal alignment. Moderate lumbar degenerative spondylosis at L4-5 and L5-S1 with disc space narrowing, sclerosis and osteophytes. Preserved vertebral body heights. No acute compression fracture, wedge-shaped deformity or focal kyphosis. Normal appearing pedicles and SI joints for age. No visualized pars defects. Normal bowel gas pattern.  Remote cholecystectomy noted. IMPRESSION: Lower lumbar degenerative spondylosis as above. No acute finding by plain radiography  Electronically Signed   By: Judie PetitM.  Shick M.D.   On: 11/15/2017 10:16   Dg Sacrum/coccyx  Result Date: 11/15/2017 CLINICAL DATA:  Low back pain, fall, landed on tailbone. EXAM: SACRUM AND COCCYX - 2+ VIEW COMPARISON:  None. FINDINGS: SI joints are symmetric and unremarkable. Cortical irregularity noted in the coccyx may reflect coccygeal fracture. No sacral abnormality. IMPRESSION: Cortical irregularity in the coccyx, question coccygeal fracture. Electronically Signed   By: Charlett NoseKevin  Dover M.D.   On: 11/15/2017 10:16    Procedures Procedures (including critical care time)  Medications Ordered in ED Medications  HYDROcodone-acetaminophen (NORCO/VICODIN) 5-325 MG per tablet 1 tablet (1 tablet Oral Given 11/15/17 0927)  ketorolac (TORADOL) injection 30 mg (30 mg Intramuscular Given 11/15/17 0928)     Initial Impression / Assessment and Plan / ED Course  I have reviewed the triage vital signs and the nursing notes.  Pertinent labs & imaging results that were  available during my care of the patient were reviewed by me and considered in my medical decision making (see chart for details).     Coccyx fracture, short course of pain meds given. Will try to obtain pillow for pain control at home.   Final Clinical Impressions(s) / ED Diagnoses   Final diagnoses:  Closed fracture of coccyx, initial encounter [facility])    ED Discharge Orders        Ordered    HYDROcodone-acetaminophen (NORCO/VICODIN) 5-325 MG tablet  Every 4 hours PRN     11/15/17 1032    docusate sodium (COLACE) 100 MG capsule  Every 12 hours     11/15/17 1032       Shaughn Thomley, Barbara Cower, MD 11/15/17 1300
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
## 2018-02-25 ENCOUNTER — Telehealth: Payer: Self-pay | Admitting: *Deleted

## 2018-02-25 ENCOUNTER — Other Ambulatory Visit: Payer: Self-pay

## 2018-02-25 ENCOUNTER — Encounter (HOSPITAL_COMMUNITY): Payer: Self-pay | Admitting: Emergency Medicine

## 2018-02-25 DIAGNOSIS — N764 Abscess of vulva: Secondary | ICD-10-CM | POA: Insufficient documentation

## 2018-02-25 DIAGNOSIS — F17210 Nicotine dependence, cigarettes, uncomplicated: Secondary | ICD-10-CM | POA: Insufficient documentation

## 2018-02-25 DIAGNOSIS — Z79899 Other long term (current) drug therapy: Secondary | ICD-10-CM | POA: Insufficient documentation

## 2018-02-25 LAB — URINALYSIS, ROUTINE W REFLEX MICROSCOPIC
BACTERIA UA: NONE SEEN
BILIRUBIN URINE: NEGATIVE
Glucose, UA: >=500 mg/dL — AB
Hgb urine dipstick: NEGATIVE
KETONES UR: 5 mg/dL — AB
Nitrite: NEGATIVE
PROTEIN: NEGATIVE mg/dL
Specific Gravity, Urine: 1.032 — ABNORMAL HIGH (ref 1.005–1.030)
pH: 5 (ref 5.0–8.0)

## 2018-02-25 LAB — COMPREHENSIVE METABOLIC PANEL
ALBUMIN: 3.7 g/dL (ref 3.5–5.0)
ALT: 33 U/L (ref 14–54)
AST: 23 U/L (ref 15–41)
Alkaline Phosphatase: 66 U/L (ref 38–126)
Anion gap: 10 (ref 5–15)
BUN: 11 mg/dL (ref 6–20)
CHLORIDE: 103 mmol/L (ref 101–111)
CO2: 20 mmol/L — ABNORMAL LOW (ref 22–32)
Calcium: 9 mg/dL (ref 8.9–10.3)
Creatinine, Ser: 0.58 mg/dL (ref 0.44–1.00)
GFR calc Af Amer: >60 mL/min (ref >60)
Glucose, Bld: 273 mg/dL — ABNORMAL HIGH (ref 65–99)
POTASSIUM: 3.6 mmol/L (ref 3.5–5.1)
Sodium: 133 mmol/L — ABNORMAL LOW (ref 135–145)
Total Bilirubin: 0.7 mg/dL (ref 0.3–1.2)
Total Protein: 7 g/dL (ref 6.5–8.1)

## 2018-02-25 LAB — I-STAT BETA HCG BLOOD, ED (MC, WL, AP ONLY): I-stat hCG, quantitative: <5 m[IU]/mL (ref <5)

## 2018-02-25 LAB — CBC
HEMATOCRIT: 41.7 % (ref 36.0–46.0)
Hemoglobin: 14.4 g/dL (ref 12.0–15.0)
MCH: 31.8 pg (ref 26.0–34.0)
MCHC: 34.5 g/dL (ref 30.0–36.0)
MCV: 92.1 fL (ref 78.0–100.0)
Platelets: 256 10*3/uL (ref 150–400)
RBC: 4.53 MIL/uL (ref 3.87–5.11)
RDW: 12.8 % (ref 11.5–15.5)
WBC: 11.3 10*3/uL — AB (ref 4.0–10.5)

## 2018-02-25 LAB — LIPASE, BLOOD: LIPASE: 28 U/L (ref 11–51)

## 2018-02-25 NOTE — ED Triage Notes (Signed)
Pt c/o vaginal pain and some bleeding and discharge. Also states there is a "knot" on the labia.

## 2018-02-25 NOTE — Telephone Encounter (Signed)
Patient states she has noticed a knot in her vaginal area that is starting to get bigger and is painful. She has tried warm compresses with no relief. Informed patient we would need to see her to assess the area and will call patient if we get any sooner appointments available. Pt ok with that plan.

## 2018-02-26 ENCOUNTER — Emergency Department (HOSPITAL_COMMUNITY)
Admission: EM | Admit: 2018-02-26 | Discharge: 2018-02-26 | Disposition: A | Discharge status: Home / Self Care | Payer: Self-pay | Attending: Emergency Medicine | Admitting: Emergency Medicine

## 2018-02-26 DIAGNOSIS — N764 Abscess of vulva: Secondary | ICD-10-CM

## 2018-02-26 HISTORY — DX: Hypotension, unspecified: I95.9

## 2018-02-26 MED ORDER — DOXYCYCLINE HYCLATE 100 MG PO CAPS: 100.0000 mg | ORAL_CAPSULE | Freq: Two times a day (BID) | ORAL | 0 refills | Status: DC

## 2018-02-26 MED ORDER — ONDANSETRON 4 MG PO TBDP
8.0000 mg | ORAL_TABLET | Freq: Once | ORAL | Status: AC
  Administered 2018-02-26: 8 mg via ORAL
  Filled 2018-02-26: qty 2

## 2018-02-26 MED ORDER — HYDROMORPHONE HCL 1 MG/ML IJ SOLN
1.0000 mg | Freq: Once | INTRAMUSCULAR | Status: AC
  Administered 2018-02-26: 1 mg via INTRAMUSCULAR
  Filled 2018-02-26: qty 1

## 2018-02-26 MED ORDER — ACETAMINOPHEN 325 MG PO TABS
650.0000 mg | ORAL_TABLET | Freq: Once | ORAL | Status: AC
  Administered 2018-02-26: 650 mg via ORAL
  Filled 2018-02-26: qty 2

## 2018-02-26 MED ORDER — HYDROCODONE-ACETAMINOPHEN 5-325 MG PO TABS: 1.0000 | ORAL_TABLET | ORAL | 0 refills | Status: DC | PRN

## 2018-02-26 MED ORDER — LIDOCAINE HCL 2 % IJ SOLN
10.0000 mL | Freq: Once | INTRAMUSCULAR | Status: AC
  Administered 2018-02-26: 200 mg
  Filled 2018-02-26: qty 20

## 2018-02-26 MED ORDER — IBUPROFEN 400 MG PO TABS
400.0000 mg | ORAL_TABLET | Freq: Once | ORAL | Status: AC | PRN
  Administered 2018-02-26: 400 mg via ORAL
  Filled 2018-02-26: qty 1

## 2018-02-26 NOTE — ED Notes (Signed)
Pelvic cart at bedside. 

## 2018-02-26 NOTE — ED Notes (Signed)
ED Provider at bedside. 

## 2018-02-26 NOTE — ED Notes (Signed)
Suture cart at bedside 

## 2018-02-26 NOTE — ED Provider Notes (Signed)
[facility] EMERGENCY DEPARTMENT Provider Note   CSN: [id number] Arrival date & time: 02/25/18  1850     History   Chief Complaint Chief Complaint  Patient presents with  . Pelvic Pain    HPI Alexandria Mejia is a 47 y.o. female.  Patient presents to the emergency department for evaluation of vaginal pain.  Patient reports that symptoms have been ongoing for 3 or 4 days.  She reports that it started with a small swollen area on the left side of her vagina that she thought was just an infected hair follicle.  Since then, however, the area has progressively worsened in pain, swelling and size.  No drainage.  No fever.      Past Medical History:  Diagnosis Date  . Back pain   . Complication of anesthesia    patient states "with last surgery I was hard to wake up".  . GERD (gastroesophageal reflux disease)   . Hypotension   . Migraine     Patient Active Problem List   Diagnosis Date Noted  . H/O repair of right rotator cuff 10/24/2014  . Partial tear of rotator cuff 03/01/2014  . Shoulder injury 02/14/2014  . Rotator cuff tear, right 02/14/2014  . Routine general medical examination at a health care facility 09/17/2012  . Shoulder pain 09/17/2012  . Stress 09/17/2012  . Candidiasis, intertrigo 09/17/2012  . Obesity, unspecified 08/04/2012  . Chronic back pain 08/04/2012  . Nausea 08/04/2012  . Tobacco user 08/04/2012    Past Surgical History:  Procedure Laterality Date  . APPENDECTOMY    . BACK SURGERY     lumbar-herniated disc  . CHOLECYSTECTOMY    . FLEXOR TENOTOMY Right 10/12/2014   Procedure: BICEPS TENOTOMY;  Surgeon: Vickki HearingStanley E Harrison, MD;  Location: [facility];  Service: Orthopedics;  Laterality: Right;  . NECK SURGERY     decompression of 7 disc  . SHOULDER ARTHROSCOPY WITH ROTATOR CUFF REPAIR Right 05/27/2014   Procedure: SHOULDER ARTHROSCOPY WITH ROTATOR CUFF REPAIR, subscalpularis repair, open supraspinatus repair;  Surgeon:  Vickki HearingStanley E Harrison, MD;  Location: [facility];  Service: Orthopedics;  Laterality: Right;  . SHOULDER OPEN ROTATOR CUFF REPAIR Right 10/12/2014   Procedure: OPEN ROTATOR CUFF REPAIR SHOULDER;  Surgeon: Vickki HearingStanley E Harrison, MD;  Location: [facility];  Service: Orthopedics;  Laterality: Right;  . ULNAR NERVE TRANSPOSITION Right 11/13/2015   Procedure: RIGHT ULNAR NERVE TRANSPOSITION;  Surgeon: Mack Hookavid Thompson, MD;  Location: [facility];  Service: Orthopedics;  Laterality: Right;    OB History    No data available       Home Medications    Prior to Admission medications   Medication Sig Start Date End Date Taking? Authorizing Provider  ibuprofen (ADVIL,MOTRIN) 800 MG tablet TAKE 1 TABLET(800 MG) BY MOUTH THREE TIMES DAILY Patient taking differently: TAKE 1 TABLET(800 MG) BY MOUTH TWO TIMES DAILY 12/22/16  Yes Vickki HearingHarrison, Stanley E, MD  doxycycline (VIBRAMYCIN) 100 MG capsule Take 1 capsule (100 mg total) by mouth 2 (two) times daily. 02/26/18   Gilda CreasePollina, Brooklynne Pereida J, MD  HYDROcodone-acetaminophen (NORCO/VICODIN) 5-325 MG tablet Take 1 tablet by mouth every 4 (four) hours as needed for moderate pain. 02/26/18   Gilda CreasePollina, Yader Criger J, MD    Family History Family History  Problem Relation Age of Onset  . COPD Mother   . Arthritis Mother   . Diabetes Father   . Heart disease Father   . Hypertension Brother   . Diabetes Paternal Grandmother   .  Diabetes Maternal Grandmother   . Dementia Paternal Grandfather     Social History Social History   Tobacco Use  . Smoking status: Current Some Day Smoker    Packs/day: 0.50    Years: 30.00    Pack years: 15.00    Types: Cigarettes  . Smokeless tobacco: Never Used  Substance Use Topics  . Alcohol use: Yes    Comment: occ  . Drug use: No     Allergies   Codeine and Sulfa antibiotics   Review of Systems Review of Systems  Genitourinary: Positive for vaginal pain.  All other systems reviewed and are negative.    Physical  Exam Updated Vital Signs BP 105/60   Pulse (!) 57   Temp 98.2 F (36.8 C)   Resp 17   Ht 5\' 9"  (1.753 m)   Wt 99.8 kg (220 lb)   LMP 02/11/2018   SpO2 96%   BMI 32.49 kg/m   Physical Exam  Constitutional: She is oriented to person, place, and time. She appears well-developed and well-nourished. No distress.  HENT:  Head: Normocephalic and atraumatic.  Right Ear: Hearing normal.  Left Ear: Hearing normal.  Nose: Nose normal.  Mouth/Throat: Oropharynx is clear and moist and mucous membranes are normal.  Eyes: Conjunctivae and EOM are normal. Pupils are equal, round, and reactive to light.  Neck: Normal range of motion. Neck supple.  Cardiovascular: Regular rhythm, S1 normal and S2 normal. Exam reveals no gallop and no friction rub.  No murmur heard. Pulmonary/Chest: Effort normal and breath sounds normal. No respiratory distress. She exhibits no tenderness.  Abdominal: Soft. Normal appearance and bowel sounds are normal. There is no hepatosplenomegaly. There is no tenderness. There is no rebound, no guarding, no tenderness at McBurney's point and negative Murphy's sign. No hernia.  Genitourinary:    There is tenderness on the left labia.  Musculoskeletal: Normal range of motion.  Neurological: She is alert and oriented to person, place, and time. She has normal strength. No cranial nerve deficit or sensory deficit. Coordination normal. GCS eye subscore is 4. GCS verbal subscore is 5. GCS motor subscore is 6.  Skin: Skin is warm, dry and intact. No rash noted. No cyanosis.  Psychiatric: She has a normal mood and affect. Her speech is normal and behavior is normal. Thought content normal.  Nursing note and vitals reviewed.    ED Treatments / Results  Labs (all labs ordered are listed, but only abnormal results are displayed) Labs Reviewed  COMPREHENSIVE METABOLIC PANEL - Abnormal; Notable for the following components:      Result Value   Sodium 133 (*)    CO2 20 (*)     Glucose, Bld 273 (*)    All other components within normal limits  CBC - Abnormal; Notable for the following components:   WBC 11.3 (*)    All other components within normal limits  URINALYSIS, ROUTINE W REFLEX MICROSCOPIC - Abnormal; Notable for the following components:   Specific Gravity, Urine 1.032 (*)    Glucose, UA >=500 (*)    Ketones, ur 5 (*)    Leukocytes, UA SMALL (*)    Squamous Epithelial / LPF 0-5 (*)    All other components within normal limits  LIPASE, BLOOD  I-STAT BETA HCG BLOOD, ED ([facility], [facility], [facility] ONLY)    EKG  EKG Interpretation None       Radiology No results found.  Procedures .Marland KitchenIncision and Drainage Date/Time: 02/26/2018 6:44 AM Performed by: Jaci Carrel  J, MD Authorized by: Gilda Crease, MD   Consent:    Consent obtained:  Verbal   Consent given by:  Patient   Risks discussed:  Bleeding, incomplete drainage and pain Universal protocol:    Site/side marked: yes     Immediately prior to procedure a time out was called: yes     Patient identity confirmed:  Verbally with patient Location:    Type:  Abscess   Location:  Anogenital   Anogenital location: superior portion inner labia - left. Pre-procedure details:    Skin preparation:  Betadine Anesthesia (see MAR for exact dosages):    Anesthesia method:  Local infiltration   Local anesthetic:  Lidocaine 2% w/o epi Procedure type:    Complexity:  Simple Procedure details:    Incision types:  Single straight   Scalpel blade:  11   Wound management:  Probed and deloculated and irrigated with saline   Drainage:  Purulent   Drainage amount:  Copious   Wound treatment:  Wound left open Post-procedure details:    Patient tolerance of procedure:  Tolerated well, no immediate complications   (including critical care time)  Medications Ordered in ED Medications  ibuprofen (ADVIL,MOTRIN) tablet 400 mg (400 mg Oral Given 02/26/18 0020)  acetaminophen (TYLENOL) tablet 650 mg (650  mg Oral Given 02/26/18 0350)  HYDROmorphone (DILAUDID) injection 1 mg (1 mg Intramuscular Given 02/26/18 0555)  ondansetron (ZOFRAN-ODT) disintegrating tablet 8 mg (8 mg Oral Given 02/26/18 0554)  lidocaine (XYLOCAINE) 2 % (with pres) injection 200 mg (200 mg Infiltration Given by Other 02/26/18 4540)     Initial Impression / Assessment and Plan / ED Course  I have reviewed the triage vital signs and the nursing notes.  Pertinent labs & imaging results that were available during my care of the patient were reviewed by me and considered in my medical decision making (see chart for details).     Examination revealed a large tender fluctuant lesion at the superior aspect of the left inner labia consistent with abscess.  This was drained of a large amount of pus.  Patient will be initiated on doxycycline.  She has follow-up scheduled on March 27 with OB/GYN.  For the next few days she will do sitz baths and return if area worsens.  Final Clinical Impressions(s) / ED Diagnoses   Final diagnoses:  Left genital labial abscess    ED Discharge Orders        Ordered    HYDROcodone-acetaminophen (NORCO/VICODIN) 5-325 MG tablet  Every 4 hours PRN     02/26/18 0648    doxycycline (VIBRAMYCIN) 100 MG capsule  2 times daily     02/26/18 0648       Gilda Crease, MD 02/26/18 [phone]

## 2018-02-26 NOTE — ED Notes (Signed)
Spoke to Pt about wait time. Pt agitated at the moment. Pt states she in 10/10 pain and requesting pain medication

## 2018-02-26 NOTE — ED Notes (Signed)
Pt requesting something for pain.  

## 2018-03-11 ENCOUNTER — Encounter: Payer: Self-pay | Admitting: Obstetrics and Gynecology

## 2018-08-09 ENCOUNTER — Other Ambulatory Visit: Payer: Self-pay

## 2018-08-09 ENCOUNTER — Encounter (HOSPITAL_COMMUNITY): Payer: Self-pay

## 2018-08-09 ENCOUNTER — Emergency Department (HOSPITAL_COMMUNITY): Payer: BLUE CROSS/BLUE SHIELD

## 2018-08-09 ENCOUNTER — Emergency Department (HOSPITAL_COMMUNITY)
Admission: EM | Admit: 2018-08-09 | Discharge: 2018-08-09 | Disposition: A | Discharge status: Home / Self Care | Payer: BLUE CROSS/BLUE SHIELD | Attending: Emergency Medicine | Admitting: Emergency Medicine

## 2018-08-09 DIAGNOSIS — Z79899 Other long term (current) drug therapy: Secondary | ICD-10-CM | POA: Diagnosis not present

## 2018-08-09 DIAGNOSIS — F17210 Nicotine dependence, cigarettes, uncomplicated: Secondary | ICD-10-CM | POA: Insufficient documentation

## 2018-08-09 DIAGNOSIS — R739 Hyperglycemia, unspecified: Secondary | ICD-10-CM | POA: Diagnosis not present

## 2018-08-09 DIAGNOSIS — R079 Chest pain, unspecified: Secondary | ICD-10-CM | POA: Diagnosis present

## 2018-08-09 DIAGNOSIS — R0789 Other chest pain: Secondary | ICD-10-CM | POA: Diagnosis not present

## 2018-08-09 LAB — BASIC METABOLIC PANEL
Anion gap: 8 (ref 5–15)
BUN: 6 mg/dL (ref 6–20)
CHLORIDE: 106 mmol/L (ref 98–111)
CO2: 23 mmol/L (ref 22–32)
Calcium: 9 mg/dL (ref 8.9–10.3)
Creatinine, Ser: 0.55 mg/dL (ref 0.44–1.00)
GFR calc Af Amer: >60 mL/min (ref >60)
GFR calc non Af Amer: >60 mL/min (ref >60)
Glucose, Bld: 339 mg/dL — ABNORMAL HIGH (ref 70–99)
POTASSIUM: 3.5 mmol/L (ref 3.5–5.1)
Sodium: 137 mmol/L (ref 135–145)

## 2018-08-09 LAB — CBC
HEMATOCRIT: 40.5 % (ref 36.0–46.0)
Hemoglobin: 13.7 g/dL (ref 12.0–15.0)
MCH: 31.4 pg (ref 26.0–34.0)
MCHC: 33.8 g/dL (ref 30.0–36.0)
MCV: 92.7 fL (ref 78.0–100.0)
Platelets: 225 10*3/uL (ref 150–400)
RBC: 4.37 MIL/uL (ref 3.87–5.11)
RDW: 12.6 % (ref 11.5–15.5)
WBC: 10.8 10*3/uL — ABNORMAL HIGH (ref 4.0–10.5)

## 2018-08-09 LAB — TROPONIN I: Troponin I: <0.03 ng/mL (ref <0.03)

## 2018-08-09 LAB — CBG MONITORING, ED: GLUCOSE-CAPILLARY: 208 mg/dL — AB (ref 70–99)

## 2018-08-09 LAB — POC URINE PREG, ED: Preg Test, Ur: NEGATIVE

## 2018-08-09 MED ORDER — KETOROLAC TROMETHAMINE 30 MG/ML IJ SOLN
30.0000 mg | Freq: Once | INTRAMUSCULAR | Status: AC
  Administered 2018-08-09: 30 mg via INTRAVENOUS
  Filled 2018-08-09: qty 1

## 2018-08-09 MED ORDER — ORPHENADRINE CITRATE ER 100 MG PO TB12: 100.0000 mg | ORAL_TABLET | Freq: Two times a day (BID) | ORAL | 0 refills | Status: DC

## 2018-08-09 MED ORDER — SODIUM CHLORIDE 0.9 % IV BOLUS
1000.0000 mL | Freq: Once | INTRAVENOUS | Status: AC
  Administered 2018-08-09: 1000 mL via INTRAVENOUS

## 2018-08-09 MED ORDER — SODIUM CHLORIDE 0.9 % IV BOLUS
500.0000 mL | Freq: Once | INTRAVENOUS | Status: AC
  Administered 2018-08-09: 500 mL via INTRAVENOUS

## 2018-08-09 NOTE — ED Provider Notes (Signed)
[facility] EMERGENCY DEPARTMENT Provider Note   CSN: [id number] Arrival date & time: 08/09/18  0224  Time seen 03:25 AM   History   Chief Complaint Chief Complaint  Patient presents with  . Chest Pain    HPI Alexandria Mejia is a 47 y.o. female.  HPI patient states she was awakened at 1 AM with acute onset of chest pain.  She states it is extremely pleuritic and even mild shallow breaths hurt.  She states it hurts to touch and to change positions.  She denies cough.  She states she feels short of breath because she cannot take a big deep breath.  She states she is never had this before.  She denies any change in her activity or any injury.  She denies any pain or swelling of her legs.  Patient states there is a family history of heart disease, her father had MI twice, he also had diabetes and acute renal failure and he died 2024/01/19of this year.  PCP Salley Scarlet, MD   Past Medical History:  Diagnosis Date  . Back pain   . Complication of anesthesia    patient states "with last surgery I was hard to wake up".  . GERD (gastroesophageal reflux disease)   . Hypotension   . Migraine     Patient Active Problem List   Diagnosis Date Noted  . H/O repair of right rotator cuff 10/24/2014  . Partial tear of rotator cuff 03/01/2014  . Shoulder injury 02/14/2014  . Rotator cuff tear, right 02/14/2014  . Routine general medical examination at a health care facility 09/17/2012  . Shoulder pain 09/17/2012  . Stress 09/17/2012  . Candidiasis, intertrigo 09/17/2012  . Obesity, unspecified 08/04/2012  . Chronic back pain 08/04/2012  . Nausea 08/04/2012  . Tobacco user 08/04/2012    Past Surgical History:  Procedure Laterality Date  . APPENDECTOMY    . BACK SURGERY     lumbar-herniated disc  . CHOLECYSTECTOMY    . FLEXOR TENOTOMY Right 10/12/2014   Procedure: BICEPS TENOTOMY;  Surgeon: Vickki Hearing, MD;  Location: [facility];  Service: Orthopedics;  Laterality:  Right;  . NECK SURGERY     decompression of 7 disc  . SHOULDER ARTHROSCOPY WITH ROTATOR CUFF REPAIR Right 05/27/2014   Procedure: SHOULDER ARTHROSCOPY WITH ROTATOR CUFF REPAIR, subscalpularis repair, open supraspinatus repair;  Surgeon: Vickki Hearing, MD;  Location: [facility];  Service: Orthopedics;  Laterality: Right;  . SHOULDER OPEN ROTATOR CUFF REPAIR Right 10/12/2014   Procedure: OPEN ROTATOR CUFF REPAIR SHOULDER;  Surgeon: Vickki Hearing, MD;  Location: [facility];  Service: Orthopedics;  Laterality: Right;  . ULNAR NERVE TRANSPOSITION Right 11/13/2015   Procedure: RIGHT ULNAR NERVE TRANSPOSITION;  Surgeon: Mack Hook, MD;  Location: [facility];  Service: Orthopedics;  Laterality: Right;     OB History   None      Home Medications    Prior to Admission medications   Medication Sig Start Date End Date Taking? Authorizing Provider  omeprazole (PRILOSEC) 20 MG capsule Take 20 mg by mouth daily.   Yes [provider]  traMADol (ULTRAM) 50 MG tablet Take 50 mg by mouth every 6 (six) hours as needed for moderate pain.   Yes [provider]  doxycycline (VIBRAMYCIN) 100 MG capsule Take 1 capsule (100 mg total) by mouth 2 (two) times daily. 02/26/18   Gilda Crease, MD  HYDROcodone-acetaminophen (NORCO/VICODIN) 5-325 MG tablet Take 1 tablet by  mouth every 4 (four) hours as needed for moderate pain. 02/26/18   Gilda Crease, MD  ibuprofen (ADVIL,MOTRIN) 800 MG tablet TAKE 1 TABLET(800 MG) BY MOUTH THREE TIMES DAILY Patient taking differently: TAKE 1 TABLET(800 MG) BY MOUTH TWO TIMES DAILY 12/22/16   Vickki Hearing, MD  orphenadrine (NORFLEX) 100 MG tablet Take 1 tablet (100 mg total) by mouth 2 (two) times daily. 08/09/18   Devoria Albe, MD    Family History Family History  Problem Relation Age of Onset  . COPD Mother   . Arthritis Mother   . Diabetes Father   . Heart disease Father   . Hypertension Brother   . Diabetes Paternal  Grandmother   . Diabetes Maternal Grandmother   . Dementia Paternal Grandfather     Social History Social History   Tobacco Use  . Smoking status: Current Some Day Smoker    Packs/day: 0.50    Years: 30.00    Pack years: 15.00    Types: Cigarettes  . Smokeless tobacco: Never Used  Substance Use Topics  . Alcohol use: Yes    Comment: occ  . Drug use: No  employed driving a fork lift   Allergies   Codeine and Sulfa antibiotics   Review of Systems Review of Systems  All other systems reviewed and are negative.    Physical Exam Updated Vital Signs BP 120/74   Pulse 75   Temp 98.2 F (36.8 C) (Oral)   Resp 20   Ht 5\' 9"  (1.753 m)   Wt 99.8 kg   LMP 07/09/2018 (Approximate)   SpO2 100%   BMI 32.49 kg/m   Vital signs normal    Physical Exam  Constitutional: She is oriented to person, place, and time. She appears well-developed and well-nourished.  Non-toxic appearance. She does not appear ill. She appears distressed.  HENT:  Head: Normocephalic and atraumatic.  Right Ear: External ear normal.  Left Ear: External ear normal.  Nose: Nose normal. No mucosal edema or rhinorrhea.  Mouth/Throat: Oropharynx is clear and moist and mucous membranes are normal. No dental abscesses or uvula swelling.  Eyes: Pupils are equal, round, and reactive to light. Conjunctivae and EOM are normal.  Neck: Normal range of motion and full passive range of motion without pain. Neck supple.  Cardiovascular: Normal rate, regular rhythm and normal heart sounds. Exam reveals no gallop and no friction rub.  No murmur heard. Pulmonary/Chest: Effort normal. No respiratory distress. She has decreased breath sounds. She has no wheezes. She has no rhonchi. She has no rales. She exhibits no tenderness and no crepitus.  Pt is clutching her chest Patient is tender in her right medial breast/costochondral area. Breath sounds are diminished because she cannot take a big deep breath    Abdominal:  Soft. Normal appearance and bowel sounds are normal. She exhibits no distension. There is no tenderness. There is no rebound and no guarding.  Musculoskeletal: Normal range of motion. She exhibits no edema or tenderness.  Moves all extremities well.   Neurological: She is alert and oriented to person, place, and time. She has normal strength. No cranial nerve deficit.  Skin: Skin is warm, dry and intact. No rash noted. No erythema. No pallor.  Psychiatric: Her speech is normal and behavior is normal. Her mood appears anxious.  Nursing note and vitals reviewed.    ED Treatments / Results  Labs (all labs ordered are listed, but only abnormal results are displayed) Results for orders placed or performed  during the hospital encounter of 08/09/18  Basic metabolic panel  Result Value Ref Range   Sodium 137 135 - 145 mmol/L   Potassium 3.5 3.5 - 5.1 mmol/L   Chloride 106 98 - 111 mmol/L   CO2 23 22 - 32 mmol/L   Glucose, Bld 339 (H) 70 - 99 mg/dL   BUN 6 6 - 20 mg/dL   Creatinine, Ser 4.090.55 0.44 - 1.00 mg/dL   Calcium 9.0 8.9 - 81.110.3 mg/dL   GFR calc non Af Amer >60 >60 mL/min   GFR calc Af Amer >60 >60 mL/min   Anion gap 8 5 - 15  CBC  Result Value Ref Range   WBC 10.8 (H) 4.0 - 10.5 K/uL   RBC 4.37 3.87 - 5.11 MIL/uL   Hemoglobin 13.7 12.0 - 15.0 g/dL   HCT 91.440.5 78.236.0 - 95.646.0 %   MCV 92.7 78.0 - 100.0 fL   MCH 31.4 26.0 - 34.0 pg   MCHC 33.8 30.0 - 36.0 g/dL   RDW 21.312.6 08.611.5 - 57.815.5 %   Platelets 225 150 - 400 K/uL  Troponin I  Result Value Ref Range   Troponin I <0.03 <0.03 ng/mL  POC urine preg, ED  Result Value Ref Range   Preg Test, Ur NEGATIVE NEGATIVE  CBG monitoring, ED  Result Value Ref Range   Glucose-Capillary 208 (H) 70 - 99 mg/dL   Laboratory interpretation all normal except hyperglycemia    EKG EKG Interpretation  Date/Time:  Sunday August 09 2018 02:33:16 EDT Ventricular Rate:  77 PR Interval:    QRS Duration: 92 QT Interval:  398 QTC Calculation: 451 R  Axis:   -71 Text Interpretation:  Sinus rhythm Abnormal R-wave progression, late transition Inferior infarct, old Baseline wander No significant change since last tracing 05 Feb 2015 Confirmed by Devoria AlbeKnapp, Calia Napp ([id number]) on 08/09/2018 3:18:52 AM   Radiology Dg Chest 2 View  Result Date: 08/09/2018 CLINICAL DATA:  Right-sided chest pain. Pain radiates down right arm. Shortness of breath. EXAM: CHEST - 2 VIEW COMPARISON:  CT 02/05/2015. FINDINGS: Lung volumes are low limiting assessment. Heart size is normal for technique. No confluent airspace disease, pleural effusion or pneumothorax. Limited assessment of pulmonary vasculature due to lung volumes. Possible peribronchial thickening. Surgical anchor in the right shoulder. IMPRESSION: Hypoventilatory chest.  Possible peribronchial thickening. Electronically Signed   By: Rubye OaksMelanie  Ehinger M.D.   On: 08/09/2018 03:27    Procedures Procedures (including critical care time)  Medications Ordered in ED Medications  ketorolac (TORADOL) 30 MG/ML injection 30 mg (30 mg Intravenous Given 08/09/18 0341)  sodium chloride 0.9 % bolus 1,000 mL (1,000 mLs Intravenous New Bag/Given 08/09/18 0343)  sodium chloride 0.9 % bolus 500 mL (500 mLs Intravenous New Bag/Given 08/09/18 0343)     Initial Impression / Assessment and Plan / ED Course  I have reviewed the triage vital signs and the nursing notes.  Pertinent labs & imaging results that were available during my care of the patient were reviewed by me and considered in my medical decision making (see chart for details).     When I review patient's last laboratory testing she has had a elevated glucose in the mid to high 200s for the past couple years.  She states her doctor has never discussed this with her however when I also look at her doctor's notes she has not seen her doctor since December 15, 2012.  We discussed that she needs to get a A1c done.  She was  given IV fluids to see if that would bring her blood  sugar down.  She was given IV Toradol for suspected chest wall pain.  Recheck 4:10 AM patient sleeping, she is still getting her IV fluids.  Recheck at 5:25 AM patient sleeping, her IV fluids have finished.  Repeat CBG was ordered.  After IV fluids her CBG improved to 208.  Patient states her pain is improved.  She states however it still hurts if she takes a very big deep breath however she no longer has the distress she had when she arrived.  We discussed looking at the diabetic diet and try to start following that.  She should follow-up of her primary care doctor to get a A1c and decide if she needs to start diabetes medications.  Final Clinical Impressions(s) / ED Diagnoses   Final diagnoses:  Chest wall pain  Hyperglycemia    ED Discharge Orders         Ordered    orphenadrine (NORFLEX) 100 MG tablet  2 times daily     08/09/18 0534        OTC ibuprofen and aleve  Plan discharge  Devoria Albe, MD, Concha Pyo, MD 08/09/18 [phone]

## 2018-08-09 NOTE — Discharge Instructions (Addendum)
Use ice and heat for comfort. Take ibuprofen 600 mg  4 times a day OR aleve 2 tabs twice a day for pain. You can also take the norflex for muscle soreness pain.  Recheck if you get a fever, cough, struggle to breathe. You need to see your primary care doctor about your elevated blood sugars. Look at the diabetic diet information and try to start following it.  Your blood sugar improved tonight just with IV fluids.

## 2018-08-09 NOTE — ED Triage Notes (Signed)
Pt reports chest pain (right sided) that awoke her from sleep. Radiation down right arm and shortness of breath. Pain described as sharp and shooting.

## 2018-11-04 ENCOUNTER — Emergency Department (HOSPITAL_COMMUNITY)
Admission: EM | Admit: 2018-11-04 | Discharge: 2018-11-04 | Disposition: A | Discharge status: Home / Self Care | Payer: BLUE CROSS/BLUE SHIELD | Attending: Emergency Medicine | Admitting: Emergency Medicine

## 2018-11-04 ENCOUNTER — Other Ambulatory Visit: Payer: Self-pay

## 2018-11-04 ENCOUNTER — Encounter (HOSPITAL_COMMUNITY): Payer: Self-pay | Admitting: Emergency Medicine

## 2018-11-04 DIAGNOSIS — M5442 Lumbago with sciatica, left side: Secondary | ICD-10-CM

## 2018-11-04 DIAGNOSIS — F17210 Nicotine dependence, cigarettes, uncomplicated: Secondary | ICD-10-CM | POA: Diagnosis not present

## 2018-11-04 DIAGNOSIS — Z79899 Other long term (current) drug therapy: Secondary | ICD-10-CM | POA: Insufficient documentation

## 2018-11-04 DIAGNOSIS — M545 Low back pain: Secondary | ICD-10-CM | POA: Diagnosis present

## 2018-11-04 MED ORDER — FENTANYL CITRATE (PF) 100 MCG/2ML IJ SOLN
50.0000 ug | Freq: Once | INTRAMUSCULAR | Status: AC
  Administered 2018-11-04: 50 ug via INTRAMUSCULAR
  Filled 2018-11-04: qty 2

## 2018-11-04 MED ORDER — METHOCARBAMOL 500 MG PO TABS: 500.0000 mg | ORAL_TABLET | Freq: Two times a day (BID) | ORAL | 0 refills | Status: DC

## 2018-11-04 MED ORDER — HYDROCODONE-ACETAMINOPHEN 5-325 MG PO TABS: 1.0000 | ORAL_TABLET | ORAL | 0 refills | Status: DC | PRN

## 2018-11-04 MED ORDER — KETOROLAC TROMETHAMINE 60 MG/2ML IM SOLN
60.0000 mg | Freq: Once | INTRAMUSCULAR | Status: AC
  Administered 2018-11-04: 60 mg via INTRAMUSCULAR
  Filled 2018-11-04: qty 2

## 2018-11-04 MED ORDER — METHYLPREDNISOLONE SODIUM SUCC 125 MG IJ SOLR
125.0000 mg | Freq: Once | INTRAMUSCULAR | Status: AC
  Administered 2018-11-04: 125 mg via INTRAMUSCULAR
  Filled 2018-11-04: qty 2

## 2018-11-04 NOTE — ED Triage Notes (Signed)
Patient reports back pain that started 2 days ago. Unknown cause. Low back pain with muscle spasms.

## 2018-11-04 NOTE — ED Provider Notes (Signed)
[facility] EMERGENCY DEPARTMENT Provider Note   CSN: [id number] Arrival date & time: 11/04/18  1900     History   Chief Complaint Chief Complaint  Patient presents with  . Back Pain    HPI Alexandria Mejia is a 47 y.o. female.  The history is provided by the patient. No language interpreter was used.  Back Pain   This is a new problem. The current episode started 2 days ago. The problem occurs constantly. The pain is associated with no known injury. The pain is present in the lumbar spine. The quality of the pain is described as aching. The pain is moderate. The symptoms are aggravated by bending and twisting. The pain is the same all the time. She has tried NSAIDs for the symptoms. The treatment provided no relief.   Pt has had 3 previous disc injuries.  Past Medical History:  Diagnosis Date  . Back pain   . Complication of anesthesia    patient states "with last surgery I was hard to wake up".  . GERD (gastroesophageal reflux disease)   . Hypotension   . Migraine     Patient Active Problem List   Diagnosis Date Noted  . H/O repair of right rotator cuff 10/24/2014  . Partial tear of rotator cuff 03/01/2014  . Shoulder injury 02/14/2014  . Rotator cuff tear, right 02/14/2014  . Routine general medical examination at a health care facility 09/17/2012  . Shoulder pain 09/17/2012  . Stress 09/17/2012  . Candidiasis, intertrigo 09/17/2012  . Obesity, unspecified 08/04/2012  . Chronic back pain 08/04/2012  . Nausea 08/04/2012  . Tobacco user 08/04/2012    Past Surgical History:  Procedure Laterality Date  . APPENDECTOMY    . BACK SURGERY     lumbar-herniated disc  . CHOLECYSTECTOMY    . FLEXOR TENOTOMY Right 10/12/2014   Procedure: BICEPS TENOTOMY;  Surgeon: Vickki HearingStanley E Harrison, MD;  Location: [facility];  Service: Orthopedics;  Laterality: Right;  . NECK SURGERY     decompression of 7 disc  . SHOULDER ARTHROSCOPY WITH ROTATOR CUFF REPAIR Right 05/27/2014   Procedure: SHOULDER ARTHROSCOPY WITH ROTATOR CUFF REPAIR, subscalpularis repair, open supraspinatus repair;  Surgeon: Vickki HearingStanley E Harrison, MD;  Location: [facility];  Service: Orthopedics;  Laterality: Right;  . SHOULDER OPEN ROTATOR CUFF REPAIR Right 10/12/2014   Procedure: OPEN ROTATOR CUFF REPAIR SHOULDER;  Surgeon: Vickki HearingStanley E Harrison, MD;  Location: [facility];  Service: Orthopedics;  Laterality: Right;  . ULNAR NERVE TRANSPOSITION Right 11/13/2015   Procedure: RIGHT ULNAR NERVE TRANSPOSITION;  Surgeon: Mack Hookavid Thompson, MD;  Location: [facility];  Service: Orthopedics;  Laterality: Right;     OB History   None      Home Medications    Prior to Admission medications   Medication Sig Start Date End Date Taking? Authorizing Provider  doxycycline (VIBRAMYCIN) 100 MG capsule Take 1 capsule (100 mg total) by mouth 2 (two) times daily. 02/26/18   Gilda CreasePollina, Christopher J, MD  HYDROcodone-acetaminophen (NORCO/VICODIN) 5-325 MG tablet Take 1 tablet by mouth every 4 (four) hours as needed for moderate pain. 02/26/18   Gilda CreasePollina, Christopher J, MD  ibuprofen (ADVIL,MOTRIN) 800 MG tablet TAKE 1 TABLET(800 MG) BY MOUTH THREE TIMES DAILY Patient taking differently: TAKE 1 TABLET(800 MG) BY MOUTH TWO TIMES DAILY 12/22/16   Vickki HearingHarrison, Stanley E, MD  omeprazole (PRILOSEC) 20 MG capsule Take 20 mg by mouth daily.    [provider]  orphenadrine (NORFLEX) 100 MG tablet Take 1  tablet (100 mg total) by mouth 2 (two) times daily. 08/09/18   Devoria Albe, MD  traMADol (ULTRAM) 50 MG tablet Take 50 mg by mouth every 6 (six) hours as needed for moderate pain.    [provider]    Family History Family History  Problem Relation Age of Onset  . COPD Mother   . Arthritis Mother   . Diabetes Father   . Heart disease Father   . Hypertension Brother   . Diabetes Paternal Grandmother   . Diabetes Maternal Grandmother   . Dementia Paternal Grandfather     Social History Social History    Tobacco Use  . Smoking status: Current Some Day Smoker    Packs/day: 0.50    Years: 30.00    Pack years: 15.00    Types: Cigarettes  . Smokeless tobacco: Never Used  Substance Use Topics  . Alcohol use: Yes    Comment: occ  . Drug use: No     Allergies   Codeine and Sulfa antibiotics   Review of Systems Review of Systems  Musculoskeletal: Positive for back pain.  All other systems reviewed and are negative.    Physical Exam Updated Vital Signs BP 102/87 (BP Location: Right Arm)   Pulse 84   Temp (!) 97.5 F (36.4 C) (Oral)   Resp 16   Ht 5\' 9"  (1.753 m)   Wt 99.8 kg   LMP 10/04/2018   SpO2 98%   BMI 32.49 kg/m   Physical Exam  Constitutional: She is oriented to person, place, and time. She appears well-developed and well-nourished.  HENT:  Head: Normocephalic.  Eyes: Pupils are equal, round, and reactive to light. EOM are normal.  Neck: Normal range of motion.  Cardiovascular: Normal rate.  Pulmonary/Chest: Effort normal.  Abdominal: She exhibits no distension.  Musculoskeletal: Normal range of motion.  Tender lower lumbar spine  Neurological: She is alert and oriented to person, place, and time.  Skin: Skin is warm.  Psychiatric: She has a normal mood and affect.  Nursing note and vitals reviewed.    ED Treatments / Results  Labs (all labs ordered are listed, but only abnormal results are displayed) Labs Reviewed - No data to display  EKG None  Radiology No results found.  Procedures Procedures (including critical care time)  Medications Ordered in ED Medications - No data to display   Initial Impression / Assessment and Plan / ED Course  I have reviewed the triage vital signs and the nursing notes.  Pertinent labs & imaging results that were available during my care of the patient were reviewed by me and considered in my medical decision making (see chart for details).     MDM  Pt given torodol, solumedrol and fentanyl for pain.    Pt given rx for robaxin and hydrocodone.  I advised call Dr.Harrison to be seen for evalution   Final Clinical Impressions(s) / ED Diagnoses   Final diagnoses:  Acute midline low back pain with left-sided sciatica    ED Discharge Orders         Ordered    HYDROcodone-acetaminophen (NORCO/VICODIN) 5-325 MG tablet  Every 4 hours PRN     11/04/18 1952    methocarbamol (ROBAXIN) 500 MG tablet  2 times daily     11/04/18 1952           Alexandria Mejia 11/04/18 Grayce Sessions, MD 11/05/18 [phone]

## 2019-01-11 ENCOUNTER — Encounter: Payer: Self-pay | Admitting: Orthopedic Surgery

## 2019-01-11 ENCOUNTER — Ambulatory Visit (INDEPENDENT_AMBULATORY_CARE_PROVIDER_SITE_OTHER): Payer: BLUE CROSS/BLUE SHIELD

## 2019-01-11 ENCOUNTER — Telehealth: Payer: Self-pay | Admitting: Radiology

## 2019-01-11 ENCOUNTER — Ambulatory Visit: Payer: BLUE CROSS/BLUE SHIELD | Admitting: Orthopedic Surgery

## 2019-01-11 VITALS — BP 111/74 | HR 66 | Ht 69.0 in | Wt 220.0 lb

## 2019-01-11 DIAGNOSIS — M23321 Other meniscus derangements, posterior horn of medial meniscus, right knee: Secondary | ICD-10-CM

## 2019-01-11 DIAGNOSIS — M25551 Pain in right hip: Secondary | ICD-10-CM

## 2019-01-11 DIAGNOSIS — M25561 Pain in right knee: Secondary | ICD-10-CM

## 2019-01-11 DIAGNOSIS — G8929 Other chronic pain: Secondary | ICD-10-CM

## 2019-01-11 DIAGNOSIS — Z9889 Other specified postprocedural states: Secondary | ICD-10-CM

## 2019-01-11 DIAGNOSIS — M1711 Unilateral primary osteoarthritis, right knee: Secondary | ICD-10-CM | POA: Diagnosis not present

## 2019-01-11 DIAGNOSIS — M5441 Lumbago with sciatica, right side: Secondary | ICD-10-CM | POA: Diagnosis not present

## 2019-01-11 MED ORDER — PREDNISONE 10 MG (48) PO TBPK: ORAL_TABLET | Freq: Every day | ORAL | 0 refills | Status: DC

## 2019-01-11 NOTE — Progress Notes (Signed)
NEW PATIENT OFFICE VISIT  Chief Complaint  Patient presents with  . Back Pain    Lower back pain going down right leg. Rt knee swelling, catching and pain.  . Hip Pain    48 year old female presents with a 4-week history of pain in her right hip and right knee with pain radiating from the right lower back and hip into the groin anterior thigh and right knee associated right knee swelling burning stabbing pain unrelieved by ibuprofen no history of trauma.  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  Gastrointestinal: Negative.   Genitourinary: Negative.   Musculoskeletal: Positive for back pain.  Neurological: Positive for tingling and sensory change. Negative for focal weakness and weakness.     Past Medical History:  Diagnosis Date  . Back pain   . Complication of anesthesia    patient states "with last surgery I was hard to wake up".  . GERD (gastroesophageal reflux disease)   . Hypotension   . Migraine     Past Surgical History:  Procedure Laterality Date  . APPENDECTOMY    . BACK SURGERY     lumbar-herniated disc  . CHOLECYSTECTOMY    . FLEXOR TENOTOMY Right 10/12/2014   Procedure: BICEPS TENOTOMY;  Surgeon: Vickki HearingStanley E Corley Maffeo, MD;  Location: [facility];  Service: Orthopedics;  Laterality: Right;  . NECK SURGERY     decompression of 7 disc  . SHOULDER ARTHROSCOPY WITH ROTATOR CUFF REPAIR Right 05/27/2014   Procedure: SHOULDER ARTHROSCOPY WITH ROTATOR CUFF REPAIR, subscalpularis repair, open supraspinatus repair;  Surgeon: Vickki HearingStanley E Dabria Wadas, MD;  Location: [facility];  Service: Orthopedics;  Laterality: Right;  . SHOULDER OPEN ROTATOR CUFF REPAIR Right 10/12/2014   Procedure: OPEN ROTATOR CUFF REPAIR SHOULDER;   Surgeon: Vickki HearingStanley E Dereka Lueras, MD;  Location: [facility];  Service: Orthopedics;  Laterality: Right;  . ULNAR NERVE TRANSPOSITION Right 11/13/2015   Procedure: RIGHT ULNAR NERVE TRANSPOSITION;  Surgeon: Mack Hookavid Thompson, MD;  Location: [facility];  Service: Orthopedics;  Laterality: Right;    Family History  Problem Relation Age of Onset  . COPD Mother   . Arthritis Mother   . Diabetes Father   . Heart disease Father   . Hypertension Brother   . Diabetes Paternal Grandmother   . Diabetes Maternal Grandmother   . Dementia Paternal Grandfather    Social History   Tobacco Use  . Smoking status: Current Some Day Smoker    Packs/day: 0.50    Years: 30.00    Pack years: 15.00    Types: Cigarettes  . Smokeless tobacco: Never Used  Substance Use Topics  . Alcohol use: Yes    Comment: occ  . Drug use: No    Allergies  Allergen Reactions  . Codeine Itching  . Sulfa Antibiotics Itching and Swelling    No outpatient medications have been marked as taking for the 01/11/19 encounter (Office Visit) with Vickki HearingHarrison, Jahvon Gosline E, MD.    BP 111/74   Pulse 66   Ht 5\' 9"  (1.753 m)   Wt 220 lb (99.8 kg)   BMI 32.49 kg/m   Physical Exam Vitals signs reviewed.  Constitutional:      Appearance: She  is well-developed.  Musculoskeletal:     Right knee: She exhibits no effusion.     Left knee: She exhibits no effusion.  Neurological:     Mental Status: She is alert and oriented to person, place, and time.  Psychiatric:        Attention and Perception: Attention normal.        Mood and Affect: Mood and affect normal.        Speech: Speech normal.        Behavior: Behavior normal.        Thought Content: Thought content normal.        Judgment: Judgment normal.     Right Knee Exam   Muscle Strength  The patient has normal right knee strength.  Tenderness  The patient is experiencing tenderness in the medial joint line.  Range of Motion  Extension:  10 normal   Flexion:  130 normal   Tests  McMurray:  Medial - positive Lateral - negative Varus: negative Valgus: negative Drawer:  Anterior - negative    Posterior - negative  Other  Erythema: absent Scars: absent Sensation: normal Pulse: present Swelling: none Effusion: no effusion present   Left Knee Exam   Muscle Strength  The patient has normal left knee strength.  Tenderness  The patient is experiencing no tenderness.   Range of Motion  Extension: normal  Flexion: normal   Tests  McMurray:  Medial - negative Lateral - negative Varus: negative Valgus: negative Drawer:  Anterior - negative     Posterior - negative  Other  Erythema: absent Scars: absent Sensation: normal Pulse: present Swelling: none Effusion: no effusion present    Back tenderness no hip joint pain with range of motion on either side the tenderness in the back is on the right side the straight leg raise is negative    MEDICAL DECISION SECTION  Xrays were done at [facility]   My independent reading of xrays:  Pelvis both hips look normal as is the entire pelvis  Lumbar 3 views of the lumbar spine show extensive joint space narrowing of the L4-5 disc space and also joint space narrowing in the L2-3 disc space with abnormal coronal and sagittal plane alignment   Knee joint space narrowing of the medial compartment with 3 degree tibiofemoral alignment     Encounter Diagnoses  Name Primary?  . Chronic midline low back pain with right-sided sciatica   . Primary osteoarthritis of right knee   . Chronic right hip pain   . Derangement of posterior horn of medial meniscus of right knee Yes  . History of back surgery     PLAN: (Rx., injectx, surgery, frx, mri/ct) Mri right knee  Phys therapy  L spine and give prednisone dose pack    Meds ordered this encounter  Medications  . predniSONE (STERAPRED UNI-PAK 48 TAB) 10 MG (48) TBPK tablet    Sig: Take by mouth daily.    Dispense:  48 tablet     Refill:  0    Fuller Canada, MD  01/11/2019 11:26 AM

## 2019-01-11 NOTE — Telephone Encounter (Signed)
Left message for patient, MRI has been approved for [facility], she can call to schedule, left phone number for her to call, then asked her to call back here to schedule a follow up .

## 2019-01-27 ENCOUNTER — Ambulatory Visit
Admission: RE | Admit: 2019-01-27 | Discharge: 2019-01-27 | Disposition: A | Discharge status: Home / Self Care | Payer: BLUE CROSS/BLUE SHIELD | Source: Ambulatory Visit | Attending: Orthopedic Surgery | Admitting: Orthopedic Surgery

## 2019-01-27 DIAGNOSIS — M23321 Other meniscus derangements, posterior horn of medial meniscus, right knee: Secondary | ICD-10-CM

## 2019-05-28 ENCOUNTER — Other Ambulatory Visit: Payer: Self-pay

## 2019-05-28 ENCOUNTER — Encounter (HOSPITAL_COMMUNITY): Payer: Self-pay

## 2019-05-28 ENCOUNTER — Observation Stay (HOSPITAL_COMMUNITY)
Admission: EM | Admit: 2019-05-28 | Discharge: 2019-05-29 | Disposition: A | Discharge status: Home / Self Care | Payer: Self-pay | Attending: Family Medicine | Admitting: Family Medicine

## 2019-05-28 DIAGNOSIS — E1110 Type 2 diabetes mellitus with ketoacidosis without coma: Secondary | ICD-10-CM | POA: Diagnosis present

## 2019-05-28 DIAGNOSIS — E1165 Type 2 diabetes mellitus with hyperglycemia: Secondary | ICD-10-CM

## 2019-05-28 DIAGNOSIS — Z881 Allergy status to other antibiotic agents status: Secondary | ICD-10-CM | POA: Insufficient documentation

## 2019-05-28 DIAGNOSIS — Z885 Allergy status to narcotic agent status: Secondary | ICD-10-CM | POA: Insufficient documentation

## 2019-05-28 DIAGNOSIS — F17210 Nicotine dependence, cigarettes, uncomplicated: Secondary | ICD-10-CM | POA: Insufficient documentation

## 2019-05-28 DIAGNOSIS — Z1159 Encounter for screening for other viral diseases: Secondary | ICD-10-CM | POA: Insufficient documentation

## 2019-05-28 DIAGNOSIS — K219 Gastro-esophageal reflux disease without esophagitis: Secondary | ICD-10-CM | POA: Diagnosis present

## 2019-05-28 DIAGNOSIS — L02211 Cutaneous abscess of abdominal wall: Principal | ICD-10-CM

## 2019-05-28 DIAGNOSIS — L03311 Cellulitis of abdominal wall: Secondary | ICD-10-CM | POA: Insufficient documentation

## 2019-05-28 DIAGNOSIS — E66811 Obesity, class 1: Secondary | ICD-10-CM | POA: Diagnosis present

## 2019-05-28 DIAGNOSIS — Z833 Family history of diabetes mellitus: Secondary | ICD-10-CM | POA: Insufficient documentation

## 2019-05-28 DIAGNOSIS — L0390 Cellulitis, unspecified: Secondary | ICD-10-CM | POA: Diagnosis present

## 2019-05-28 DIAGNOSIS — Z882 Allergy status to sulfonamides status: Secondary | ICD-10-CM | POA: Insufficient documentation

## 2019-05-28 DIAGNOSIS — T8131XA Disruption of external operation (surgical) wound, not elsewhere classified, initial encounter: Secondary | ICD-10-CM

## 2019-05-28 DIAGNOSIS — Z716 Tobacco abuse counseling: Secondary | ICD-10-CM | POA: Insufficient documentation

## 2019-05-28 DIAGNOSIS — M549 Dorsalgia, unspecified: Secondary | ICD-10-CM | POA: Insufficient documentation

## 2019-05-28 DIAGNOSIS — Z6832 Body mass index (BMI) 32.0-32.9, adult: Secondary | ICD-10-CM | POA: Insufficient documentation

## 2019-05-28 DIAGNOSIS — G8929 Other chronic pain: Secondary | ICD-10-CM | POA: Diagnosis present

## 2019-05-28 DIAGNOSIS — Z720 Tobacco use: Secondary | ICD-10-CM | POA: Diagnosis present

## 2019-05-28 DIAGNOSIS — D72829 Elevated white blood cell count, unspecified: Secondary | ICD-10-CM | POA: Diagnosis present

## 2019-05-28 DIAGNOSIS — E669 Obesity, unspecified: Secondary | ICD-10-CM | POA: Insufficient documentation

## 2019-05-28 DIAGNOSIS — L0291 Cutaneous abscess, unspecified: Secondary | ICD-10-CM | POA: Diagnosis present

## 2019-05-28 DIAGNOSIS — Z79899 Other long term (current) drug therapy: Secondary | ICD-10-CM | POA: Insufficient documentation

## 2019-05-28 HISTORY — DX: Type 2 diabetes mellitus with ketoacidosis without coma: E11.10

## 2019-05-28 LAB — BASIC METABOLIC PANEL
Anion gap: 16 — ABNORMAL HIGH (ref 5–15)
Anion gap: 10 (ref 5–15)
BUN: 7 mg/dL (ref 6–20)
BUN: 6 mg/dL (ref 6–20)
CO2: 18 mmol/L — ABNORMAL LOW (ref 22–32)
CO2: 23 mmol/L (ref 22–32)
Calcium: 9 mg/dL (ref 8.9–10.3)
Calcium: 8.2 mg/dL — ABNORMAL LOW (ref 8.9–10.3)
Chloride: 99 mmol/L (ref 98–111)
Chloride: 105 mmol/L (ref 98–111)
Creatinine, Ser: 0.71 mg/dL (ref 0.44–1.00)
Creatinine, Ser: 0.45 mg/dL (ref 0.44–1.00)
GFR calc Af Amer: >60 mL/min (ref >60)
GFR calc Af Amer: >60 mL/min (ref >60)
GFR calc non Af Amer: >60 mL/min (ref >60)
GFR calc non Af Amer: >60 mL/min (ref >60)
Glucose, Bld: 424 mg/dL — ABNORMAL HIGH (ref 70–99)
Glucose, Bld: 146 mg/dL — ABNORMAL HIGH (ref 70–99)
Potassium: 4.1 mmol/L (ref 3.5–5.1)
Potassium: 3.4 mmol/L — ABNORMAL LOW (ref 3.5–5.1)
Sodium: 133 mmol/L — ABNORMAL LOW (ref 135–145)
Sodium: 138 mmol/L (ref 135–145)

## 2019-05-28 LAB — RAPID URINE DRUG SCREEN, HOSP PERFORMED
Amphetamines: NOT DETECTED
Barbiturates: NOT DETECTED
Benzodiazepines: NOT DETECTED
Cocaine: NOT DETECTED
Opiates: NOT DETECTED
Tetrahydrocannabinol: NOT DETECTED

## 2019-05-28 LAB — GLUCOSE, CAPILLARY
Glucose-Capillary: 278 mg/dL — ABNORMAL HIGH (ref 70–99)
Glucose-Capillary: 254 mg/dL — ABNORMAL HIGH (ref 70–99)
Glucose-Capillary: 183 mg/dL — ABNORMAL HIGH (ref 70–99)
Glucose-Capillary: 167 mg/dL — ABNORMAL HIGH (ref 70–99)
Glucose-Capillary: 151 mg/dL — ABNORMAL HIGH (ref 70–99)
Glucose-Capillary: 147 mg/dL — ABNORMAL HIGH (ref 70–99)
Glucose-Capillary: 161 mg/dL — ABNORMAL HIGH (ref 70–99)
Glucose-Capillary: 206 mg/dL — ABNORMAL HIGH (ref 70–99)

## 2019-05-28 LAB — SARS CORONAVIRUS 2 BY RT PCR (HOSPITAL ORDER, PERFORMED IN ~~LOC~~ HOSPITAL LAB): SARS Coronavirus 2: NEGATIVE

## 2019-05-28 LAB — CBC
HCT: 42.9 % (ref 36.0–46.0)
Hemoglobin: 14.3 g/dL (ref 12.0–15.0)
MCH: 31.8 pg (ref 26.0–34.0)
MCHC: 33.3 g/dL (ref 30.0–36.0)
MCV: 95.5 fL (ref 80.0–100.0)
Platelets: 177 10*3/uL (ref 150–400)
RBC: 4.49 MIL/uL (ref 3.87–5.11)
RDW: 12 % (ref 11.5–15.5)
WBC: 11.7 10*3/uL — ABNORMAL HIGH (ref 4.0–10.5)
nRBC: 0 % (ref 0.0–0.2)

## 2019-05-28 LAB — URINALYSIS, ROUTINE W REFLEX MICROSCOPIC
Bilirubin Urine: NEGATIVE
Glucose, UA: >=500 mg/dL — AB
Hgb urine dipstick: NEGATIVE
Ketones, ur: NEGATIVE mg/dL
Leukocytes,Ua: NEGATIVE
Nitrite: NEGATIVE
Protein, ur: NEGATIVE mg/dL
Specific Gravity, Urine: 1.035 — ABNORMAL HIGH (ref 1.005–1.030)
pH: 6 (ref 5.0–8.0)

## 2019-05-28 LAB — MRSA PCR SCREENING: MRSA by PCR: NEGATIVE

## 2019-05-28 LAB — BETA-HYDROXYBUTYRIC ACID: Beta-Hydroxybutyric Acid: 0.06 mmol/L (ref 0.05–0.27)

## 2019-05-28 LAB — HEMOGLOBIN A1C
Hgb A1c MFr Bld: 10.4 % — ABNORMAL HIGH (ref 4.8–5.6)
Mean Plasma Glucose: 251.78 mg/dL

## 2019-05-28 LAB — POC URINE PREG, ED: Preg Test, Ur: NEGATIVE

## 2019-05-28 LAB — CBG MONITORING, ED: Glucose-Capillary: 310 mg/dL — ABNORMAL HIGH (ref 70–99)

## 2019-05-28 LAB — TSH: TSH: 3.176 u[IU]/mL (ref 0.350–4.500)

## 2019-05-28 MED ORDER — SODIUM CHLORIDE 0.9 % IV BOLUS: 500.0000 mL | Freq: Once | INTRAVENOUS | Status: DC

## 2019-05-28 MED ORDER — LIVING WELL WITH DIABETES BOOK
Freq: Once | Status: AC
  Administered 2019-05-28: 14:00:00

## 2019-05-28 MED ORDER — LIDOCAINE-EPINEPHRINE (PF) 2 %-1:200000 IJ SOLN
20.0000 mL | Freq: Once | INTRAMUSCULAR | Status: AC
  Administered 2019-05-28 (×2): 20 mL
  Filled 2019-05-28: qty 20

## 2019-05-28 MED ORDER — LIDOCAINE-EPINEPHRINE (PF) 2 %-1:200000 IJ SOLN
INTRAMUSCULAR | Status: AC
  Administered 2019-05-28: 20 mL
  Filled 2019-05-28: qty 10

## 2019-05-28 MED ORDER — INSULIN ASPART PROT & ASPART (70-30 MIX) 100 UNIT/ML ~~LOC~~ SUSP
25.0000 [IU] | Freq: Two times a day (BID) | SUBCUTANEOUS | Status: DC
  Administered 2019-05-28: 25 [IU] via SUBCUTANEOUS
  Filled 2019-05-28: qty 10

## 2019-05-28 MED ORDER — POVIDONE-IODINE 10 % EX SOLN
CUTANEOUS | Status: AC
  Filled 2019-05-28: qty 15

## 2019-05-28 MED ORDER — FAMOTIDINE IN NACL 20-0.9 MG/50ML-% IV SOLN
20.0000 mg | Freq: Two times a day (BID) | INTRAVENOUS | Status: DC
  Administered 2019-05-28 – 2019-05-29 (×3): 20 mg via INTRAVENOUS
  Filled 2019-05-28 (×3): qty 50

## 2019-05-28 MED ORDER — INSULIN ASPART 100 UNIT/ML ~~LOC~~ SOLN
0.0000 [IU] | Freq: Three times a day (TID) | SUBCUTANEOUS | Status: DC
  Administered 2019-05-28: 2 [IU] via SUBCUTANEOUS
  Administered 2019-05-29: 8 [IU] via SUBCUTANEOUS
  Administered 2019-05-29: 5 [IU] via SUBCUTANEOUS

## 2019-05-28 MED ORDER — SODIUM CHLORIDE 0.9% FLUSH
3.0000 mL | Freq: Two times a day (BID) | INTRAVENOUS | Status: DC
  Administered 2019-05-28: 3 mL via INTRAVENOUS

## 2019-05-28 MED ORDER — METFORMIN HCL ER 500 MG PO TB24
500.0000 mg | ORAL_TABLET | Freq: Two times a day (BID) | ORAL | Status: DC
  Administered 2019-05-28 – 2019-05-29 (×2): 500 mg via ORAL
  Filled 2019-05-28 (×2): qty 1

## 2019-05-28 MED ORDER — INSULIN ASPART 100 UNIT/ML ~~LOC~~ SOLN
0.0000 [IU] | Freq: Every day | SUBCUTANEOUS | Status: DC
  Administered 2019-05-28: 2 [IU] via SUBCUTANEOUS

## 2019-05-28 MED ORDER — ENOXAPARIN SODIUM 40 MG/0.4ML ~~LOC~~ SOLN
40.0000 mg | SUBCUTANEOUS | Status: DC
  Administered 2019-05-28: 40 mg via SUBCUTANEOUS
  Filled 2019-05-28: qty 0.4

## 2019-05-28 MED ORDER — INSULIN STARTER KIT- PEN NEEDLES (ENGLISH)
1.0000 | Freq: Once | Status: AC
  Administered 2019-05-28: 1
  Filled 2019-05-28: qty 1

## 2019-05-28 MED ORDER — SODIUM CHLORIDE 0.9 % IV SOLN
INTRAVENOUS | Status: DC
  Administered 2019-05-28: 12:00:00 via INTRAVENOUS

## 2019-05-28 MED ORDER — SODIUM CHLORIDE 0.9 % IV BOLUS
1000.0000 mL | Freq: Once | INTRAVENOUS | Status: AC
  Administered 2019-05-28: 1000 mL via INTRAVENOUS

## 2019-05-28 MED ORDER — FENTANYL CITRATE (PF) 100 MCG/2ML IJ SOLN
12.5000 ug | INTRAMUSCULAR | Status: DC | PRN
  Administered 2019-05-28 – 2019-05-29 (×3): 25 ug via INTRAVENOUS
  Filled 2019-05-28 (×3): qty 2

## 2019-05-28 MED ORDER — DEXTROSE-NACL 5-0.45 % IV SOLN
INTRAVENOUS | Status: DC
  Administered 2019-05-28: 14:00:00 via INTRAVENOUS

## 2019-05-28 MED ORDER — INSULIN REGULAR(HUMAN) IN NACL 100-0.9 UT/100ML-% IV SOLN
INTRAVENOUS | Status: DC
  Administered 2019-05-28: 2.5 [IU]/h via INTRAVENOUS
  Filled 2019-05-28: qty 100

## 2019-05-28 MED ORDER — SODIUM CHLORIDE 0.9 % IV SOLN
INTRAVENOUS | Status: DC
  Administered 2019-05-28: 13:00:00 via INTRAVENOUS

## 2019-05-28 MED ORDER — VANCOMYCIN HCL IN DEXTROSE 1-5 GM/200ML-% IV SOLN
1000.0000 mg | Freq: Three times a day (TID) | INTRAVENOUS | Status: DC
  Administered 2019-05-28 – 2019-05-29 (×3): 1000 mg via INTRAVENOUS
  Filled 2019-05-28 (×3): qty 200

## 2019-05-28 MED ORDER — DEXTROSE-NACL 5-0.45 % IV SOLN: INTRAVENOUS | Status: DC

## 2019-05-28 MED ORDER — POTASSIUM CHLORIDE CRYS ER 20 MEQ PO TBCR
40.0000 meq | EXTENDED_RELEASE_TABLET | Freq: Once | ORAL | Status: AC
  Administered 2019-05-28: 40 meq via ORAL
  Filled 2019-05-28: qty 2

## 2019-05-28 MED ORDER — HYDROMORPHONE HCL 1 MG/ML IJ SOLN
1.0000 mg | Freq: Once | INTRAMUSCULAR | Status: AC
  Administered 2019-05-28: 09:00:00 1 mg via INTRAVENOUS
  Filled 2019-05-28: qty 1

## 2019-05-28 MED ORDER — BUTALBITAL-APAP-CAFFEINE 50-325-40 MG PO TABS
2.0000 | ORAL_TABLET | Freq: Once | ORAL | Status: AC
  Administered 2019-05-29: 2 via ORAL
  Filled 2019-05-28: qty 2

## 2019-05-28 MED ORDER — NICOTINE 21 MG/24HR TD PT24
21.0000 mg | MEDICATED_PATCH | Freq: Every day | TRANSDERMAL | Status: DC | PRN
  Administered 2019-05-28: 21 mg via TRANSDERMAL
  Filled 2019-05-28: qty 1

## 2019-05-28 MED ORDER — POTASSIUM CHLORIDE 10 MEQ/100ML IV SOLN
10.0000 meq | INTRAVENOUS | Status: AC
  Administered 2019-05-28 (×2): 10 meq via INTRAVENOUS
  Filled 2019-05-28: qty 100

## 2019-05-28 MED ORDER — VANCOMYCIN HCL 1.5 G IV SOLR
1500.0000 mg | Freq: Two times a day (BID) | INTRAVENOUS | Status: DC
  Filled 2019-05-28 (×6): qty 1500

## 2019-05-28 MED ORDER — INSULIN REGULAR(HUMAN) IN NACL 100-0.9 UT/100ML-% IV SOLN: INTRAVENOUS | Status: AC

## 2019-05-28 MED ORDER — CHLORHEXIDINE GLUCONATE CLOTH 2 % EX PADS
6.0000 | MEDICATED_PAD | Freq: Every day | CUTANEOUS | Status: DC
  Administered 2019-05-29: 6 via TOPICAL

## 2019-05-28 MED ORDER — VANCOMYCIN HCL IN DEXTROSE 1-5 GM/200ML-% IV SOLN
1000.0000 mg | INTRAVENOUS | Status: AC
  Administered 2019-05-28 (×2): 1000 mg via INTRAVENOUS
  Filled 2019-05-28 (×2): qty 200

## 2019-05-28 NOTE — H&P (Signed)
History and Physical  Alexandria PeltMichelle M Hessler ZOX:[id number] DOB: 1971/07/29 DOA: 05/28/2019  Referring physician: Adriana Simasook, MD  PCP: Patient, No Pcp Per   Chief Complaint: skin infection  HPI: Alexandria Mejia is a 48 y.o. female smoker with chronic back pain, GERD, and no prior known history of diabetes mellitus presented to ER with concerns about and abdominal skin abscess that has been getting worse for the past several days.  She says that she gets these frequently but they normally spontaneously drain and they resolve on their own.  This time the lesion continue to worsen and became very painful.  The patient denies fever and chills nausea vomiting diarrhea.  The patient denies cough.  The patient denies any known COVID-19 exposure.  Patient says that she has a family history of diabetes mellitus but has not been diagnosed.    ED course: The patient was found to have a skin abscess in the lower abdomen that required incision and drainage that was performed successfully in the ED.  The patient was noted to be hyperglycemic with a blood sugar greater than 400 and elevated anion gap and findings of metabolic acidosis.  The patient was started on IV antibiotics for treatment of the superficial skin cellulitis in the abdomen and started on IV fluids and IV insulin for treatment of a mild diabetic ketoacidosis.  Review of Systems: All systems reviewed and apart from history of presenting illness, are negative.  Past Medical History:  Diagnosis Date  . Back pain   . Complication of anesthesia    patient states "with last surgery I was hard to wake up".  . GERD (gastroesophageal reflux disease)   . Hypotension   . Migraine    Past Surgical History:  Procedure Laterality Date  . APPENDECTOMY    . BACK SURGERY     lumbar-herniated disc  . CHOLECYSTECTOMY    . FLEXOR TENOTOMY Right 10/12/2014   Procedure: BICEPS TENOTOMY;  Surgeon: Vickki HearingStanley E Harrison, MD;  Location: [facility];  Service:  Orthopedics;  Laterality: Right;  . NECK SURGERY     decompression of 7 disc  . SHOULDER ARTHROSCOPY WITH ROTATOR CUFF REPAIR Right 05/27/2014   Procedure: SHOULDER ARTHROSCOPY WITH ROTATOR CUFF REPAIR, subscalpularis repair, open supraspinatus repair;  Surgeon: Vickki HearingStanley E Harrison, MD;  Location: [facility];  Service: Orthopedics;  Laterality: Right;  . SHOULDER OPEN ROTATOR CUFF REPAIR Right 10/12/2014   Procedure: OPEN ROTATOR CUFF REPAIR SHOULDER;  Surgeon: Vickki HearingStanley E Harrison, MD;  Location: [facility];  Service: Orthopedics;  Laterality: Right;  . ULNAR NERVE TRANSPOSITION Right 11/13/2015   Procedure: RIGHT ULNAR NERVE TRANSPOSITION;  Surgeon: Mack Hookavid Thompson, MD;  Location: [facility];  Service: Orthopedics;  Laterality: Right;   Social History:  reports that she has been smoking cigarettes. She has a 15.00 pack-year smoking history. She has never used smokeless tobacco. She reports current alcohol use. She reports that she does not use drugs.  Allergies  Allergen Reactions  . Codeine Itching  . Sulfa Antibiotics Itching and Swelling    Family History  Problem Relation Age of Onset  . COPD Mother   . Arthritis Mother   . Diabetes Father   . Heart disease Father   . Hypertension Brother   . Diabetes Paternal Grandmother   . Diabetes Maternal Grandmother   . Dementia Paternal Grandfather     Prior to Admission medications   Medication Sig Start Date End Date Taking? Authorizing Provider  omeprazole (PRILOSEC) 20 MG  capsule Take 20 mg by mouth daily.    [provider]   Physical Exam: Vitals:   05/28/19 0748 05/28/19 0900 05/28/19 1000  BP: 129/63 (!) 128/105 (!) 164/98  Pulse: 82 75 83  Resp: 16 13 12   Temp: 99.2 F (37.3 C)    TempSrc: Oral    SpO2: 96% 97% 100%  Weight: 99.8 kg    Height: 5\' 9"  (1.753 m)       General exam: Moderately built and nourished patient, lying comfortably supine on the gurney in no obvious distress.  Head, eyes and ENT:  Nontraumatic and normocephalic. Pupils equally reacting to light and accommodation. Oral mucosa dry.  Neck: Supple. No JVD, carotid bruit or thyromegaly.  Lymphatics: No lymphadenopathy.  Respiratory system: Clear to auscultation. No increased work of breathing.  Cardiovascular system: S1 and S2 heard, RRR. No JVD, murmurs, gallops, clicks or pedal edema.  Gastrointestinal system: Abdomen is nondistended, soft and nontender. Normal bowel sounds heard. No organomegaly or masses appreciated.  Central nervous system: Alert and oriented. No focal neurological deficits.  Extremities: Symmetric 5 x 5 power. Peripheral pulses symmetrically felt.   Skin: cellulitis seen on left lower abdominal wall and resolving abscess, tender to palpation with cloudy drainage from open skin lesion s/p I&D with gauze packing.   Musculoskeletal system: Negative exam.  Psychiatry: Pleasant and cooperative.  Labs on Admission:  Basic Metabolic Panel: Recent Labs  Lab 05/28/19 0803  NA 133*  K 4.1  CL 99  CO2 18*  GLUCOSE 424*  BUN 7  CREATININE 0.71  CALCIUM 9.0   Liver Function Tests: No results for input(s): AST, ALT, ALKPHOS, BILITOT, PROT, ALBUMIN in the last 168 hours. No results for input(s): LIPASE, AMYLASE in the last 168 hours. No results for input(s): AMMONIA in the last 168 hours. CBC: Recent Labs  Lab 05/28/19 0803  WBC 11.7*  HGB 14.3  HCT 42.9  MCV 95.5  PLT 177   Cardiac Enzymes: No results for input(s): CKTOTAL, CKMB, CKMBINDEX, TROPONINI in the last 168 hours.  BNP (last 3 results) No results for input(s): PROBNP in the last 8760 hours. CBG: Recent Labs  Lab 05/28/19 0958  GLUCAP 310*    Radiological Exams on Admission: No results found.  Assessment/Plan Principal Problem:   DKA (diabetic ketoacidoses) (HCC) Active Problems:   Obesity, unspecified   Chronic back pain   Tobacco user   Skin abscess   GERD (gastroesophageal reflux disease)   Cellulitis    Leukocytosis   1. Mild DKA -likely this is secondary to the abscess and cellulitis of the abdominal wall which has been treated.  Continue IV insulin via the glucose stabilizer protocol and IV fluid hydration, replace potassium.  Continue DKA protocol.  I suspect she can likely transition to subcutaneous insulin later in the day.  Admit to the stepdown unit. 2. Type 2 diabetes mellitus- check hemoglobin A1c.  Treating DKA as noted above.  Based on A1c results will make a decision regarding the need to teach patient about home insulin administration.  I have asked the diabetes coordinators to start intensive inpatient diabetes training.  In addition, I have asked the care management team to assist with outpatient follow-up and medications.  The patient will need glucose meter and testing supplies.  Hopefully she will be able to discharge on oral metformin ER. 3. Tobacco abuse-I have ordered a nicotine patch to use in the hospital and have encouraged and counseled patient to stop all tobacco use. 4.  GERD-famotidine ordered IV while patient is n.p.o. on the IV insulin infusion. 5. Leukocytosis-secondary to cellulitis and abscess-this is been treated with IV antibiotics and incision and drainage. 6. Chronic back pain- holding home ibuprofen.  IV meds ordered as needed.  DVT Prophylaxis: Lovenox Code Status: Full Family Communication: Patient updated at bedside Disposition Plan: Observation management in the stepdown unit, hopeful to discharge home tomorrow if stable  Critical Care Time spent: 71 mins  Jasiah Buntin Wynetta Emery, MD [facility] How to contact the [facility] Attending or Consulting provider [place] or covering provider during after hours [place], for this patient?  1. Check the care team in [facility] and look for a) attending/consulting [facility] provider listed and b) the [facility] team listed 2. Log into [email or web address] and use [place]'s universal password to access. If you do not have the password, please  contact the hospital operator. 3. Locate the [facility] provider you are looking for under [facility] and page to a number that you can be directly reached. 4. If you still have difficulty reaching the provider, please page the [facility] (Director on Call) for the Hospitalists listed on amion for assistance.

## 2019-05-28 NOTE — Progress Notes (Addendum)
Inpatient Diabetes Program Recommendations  AACE/ADA: New Consensus Statement on Inpatient Glycemic Control (2015)  Target Ranges:  Prepandial:   less than 140 mg/dL      Peak postprandial:   less than 180 mg/dL (1-2 hours)      Critically ill patients:  140 - 180 mg/dL   Lab Results  Component Value Date   GLUCAP 278 (H) 05/28/2019   HGBA1C 5.9 (H) 08/05/2012    Review of Glycemic Control  Diabetes history: No prior hx DM Current orders for Inpatient glycemic control: IV insulin  Inpatient Diabetes Program Recommendations:   Noted patient admitted with new onset DM. Ordered Living Well With Diabetes, patient education videos, and dietician consult. Noted patient currently on IV insulin. When patient ready for transition to subcutaneous insulin, Please give basal insulin 2 hrs prior to discontinuation of IV insulin and cover CBG with correction @ time of stopping IV insulin drip. Nurses, please educate patient on diabetes as appropriate. 12:45 Spoke with patient by phone (DM Coordinator on [place]). 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 Spoke with pt about new diagnosis. Discussed A1C results with them and explained what an A1C is, basic pathophysiology of DM Type 2, basic home care, basic diabetes diet nutrition principles, importance of checking CBGs and maintaining good CBG control to prevent long-term and short-term complications. Reviewed signs and symptoms of hyperglycemia and hypoglycemia and how to treat hypoglycemia at home. Also reviewed blood sugar goals at home.  RNs to provide ongoing basic DM education at bedside with this patient. Have ordered educational booklet, insulin starter kit, and DM videos. Have also placed RD consult for DM diet education for this patient.  Discussed with patient If she is prescribed insulin on discharge if she preferred syringes or pen needles. Patient states she prefers pen needles  and reviewed lower cost of Novolin insulin and Relion meter and strips @ [facility] is available.  Thank you, Nani Gasser. Knolan Simien, RN, MSN, CDE  Diabetes Coordinator Inpatient Glycemic Control Team Team Pager [phone] (8am-5pm) 05/28/2019 12:54 PM

## 2019-05-28 NOTE — Progress Notes (Signed)
Pharmacy Antibiotic Note  Alexandria Mejia is a 48 y.o. female admitted on 05/28/2019 with cellulitis and abscess.  Pharmacy has been consulted for Vancomycin dosing.  Plan: Vancomycin 2000 mg IV x 1 dose. Vancomycin 1000 mg IV every 8 hours.  Goal trough 15-20 mcg/mL.  Monitor labs, c/s, and vanco levels as indicated.  Height: 5\' 9"  (175.3 cm) Weight: 220 lb (99.8 kg) IBW/kg (Calculated) : 66.2  Temp (24hrs), Avg:99.2 F (37.3 C), Min:99.2 F (37.3 C), Max:99.2 F (37.3 C)  Recent Labs  Lab 05/28/19 0803  WBC 11.7*  CREATININE 0.71    Estimated Creatinine Clearance: 109.2 mL/min (by C-G formula based on SCr of 0.71 mg/dL).    Allergies  Allergen Reactions  . Codeine Itching  . Sulfa Antibiotics Itching and Swelling    Antimicrobials this admission: Vanco 6/12 >>     Dose adjustments this admission: N/A  Microbiology results: N/A  Thank you for allowing pharmacy to be a part of this patient's care.  Ramond Craver 05/28/2019 11:23 AM

## 2019-05-28 NOTE — ED Provider Notes (Signed)
[facility] EMERGENCY DEPARTMENT Provider Note   CSN: [id number] Arrival date & time: 05/28/19  0725    History   Chief Complaint Chief Complaint  Patient presents with  . Abscess    HPI Alexandria Mejia is a 48 y.o. female.     Skin irritation on left lower abdomen for several days, getting worse.  She is tried hot compresses without success.  No history of diabetes.  Severity is moderate to severe.  Area is tender to palpation.     Past Medical History:  Diagnosis Date  . Back pain   . Complication of anesthesia    patient states "with last surgery I was hard to wake up".  . GERD (gastroesophageal reflux disease)   . Hypotension   . Migraine     Patient Active Problem List   Diagnosis Date Noted  . H/O repair of right rotator cuff 10/24/2014  . Partial tear of rotator cuff 03/01/2014  . Shoulder injury 02/14/2014  . Rotator cuff tear, right 02/14/2014  . Routine general medical examination at a health care facility 09/17/2012  . Shoulder pain 09/17/2012  . Stress 09/17/2012  . Candidiasis, intertrigo 09/17/2012  . Obesity, unspecified 08/04/2012  . Chronic back pain 08/04/2012  . Nausea 08/04/2012  . Tobacco user 08/04/2012    Past Surgical History:  Procedure Laterality Date  . APPENDECTOMY    . BACK SURGERY     lumbar-herniated disc  . CHOLECYSTECTOMY    . FLEXOR TENOTOMY Right 10/12/2014   Procedure: BICEPS TENOTOMY;  Surgeon: Vickki HearingStanley E Harrison, MD;  Location: [facility];  Service: Orthopedics;  Laterality: Right;  . NECK SURGERY     decompression of 7 disc  . SHOULDER ARTHROSCOPY WITH ROTATOR CUFF REPAIR Right 05/27/2014   Procedure: SHOULDER ARTHROSCOPY WITH ROTATOR CUFF REPAIR, subscalpularis repair, open supraspinatus repair;  Surgeon: Vickki HearingStanley E Harrison, MD;  Location: [facility];  Service: Orthopedics;  Laterality: Right;  . SHOULDER OPEN ROTATOR CUFF REPAIR Right 10/12/2014   Procedure: OPEN ROTATOR CUFF REPAIR SHOULDER;  Surgeon: Vickki HearingStanley E  Harrison, MD;  Location: [facility];  Service: Orthopedics;  Laterality: Right;  . ULNAR NERVE TRANSPOSITION Right 11/13/2015   Procedure: RIGHT ULNAR NERVE TRANSPOSITION;  Surgeon: Mack Hookavid Thompson, MD;  Location: [facility];  Service: Orthopedics;  Laterality: Right;     OB History   No obstetric history on file.      Home Medications    Prior to Admission medications   Medication Sig Start Date End Date Taking? Authorizing Provider  HYDROcodone-acetaminophen (NORCO/VICODIN) 5-325 MG tablet Take 1 tablet by mouth every 4 (four) hours as needed. Patient not taking: Reported on 01/11/2019 11/04/18   Elson AreasSofia, Leslie K, PA-C  methocarbamol (ROBAXIN) 500 MG tablet Take 1 tablet (500 mg total) by mouth 2 (two) times daily. Patient not taking: Reported on 01/11/2019 11/04/18   Elson AreasSofia, Leslie K, PA-C  omeprazole (PRILOSEC) 20 MG capsule Take 20 mg by mouth daily.    [provider]  predniSONE (STERAPRED UNI-PAK 48 TAB) 10 MG (48) TBPK tablet Take by mouth daily. 01/11/19   Vickki HearingHarrison, Stanley E, MD    Family History Family History  Problem Relation Age of Onset  . COPD Mother   . Arthritis Mother   . Diabetes Father   . Heart disease Father   . Hypertension Brother   . Diabetes Paternal Grandmother   . Diabetes Maternal Grandmother   . Dementia Paternal Grandfather     Social History Social History  Tobacco Use  . Smoking status: Current Some Day Smoker    Packs/day: 0.50    Years: 30.00    Pack years: 15.00    Types: Cigarettes  . Smokeless tobacco: Never Used  Substance Use Topics  . Alcohol use: Yes    Comment: occ  . Drug use: No     Allergies   Codeine and Sulfa antibiotics   Review of Systems Review of Systems  All other systems reviewed and are negative.    Physical Exam Updated Vital Signs BP (!) 128/105   Pulse 75   Temp 99.2 F (37.3 C) (Oral)   Resp 13   Ht 5\' 9"  (1.753 m)   Wt 99.8 kg   LMP 05/09/2019   SpO2 97%   BMI 32.49  kg/m   Physical Exam Vitals signs and nursing note reviewed.  Constitutional:      Appearance: She is well-developed.  HENT:     Head: Normocephalic and atraumatic.  Eyes:     Conjunctiva/sclera: Conjunctivae normal.  Neck:     Musculoskeletal: Neck supple.  Cardiovascular:     Rate and Rhythm: Normal rate and regular rhythm.  Pulmonary:     Effort: Pulmonary effort is normal.     Breath sounds: Normal breath sounds.  Abdominal:     General: Bowel sounds are normal.     Palpations: Abdomen is soft.  Musculoskeletal: Normal range of motion.  Skin:    Comments: Left lower abdomen: Area of erythema approximately 20 cm in diameter with central abscess.  Neurological:     Mental Status: She is alert and oriented to person, place, and time.  Psychiatric:        Behavior: Behavior normal.      ED Treatments / Results  Labs (all labs ordered are listed, but only abnormal results are displayed) Labs Reviewed  CBC - Abnormal; Notable for the following components:      Result Value   WBC 11.7 (*)    All other components within normal limits  BASIC METABOLIC PANEL - Abnormal; Notable for the following components:   Sodium 133 (*)    CO2 18 (*)    Glucose, Bld 424 (*)    Anion gap 16 (*)    All other components within normal limits  CBG MONITORING, ED - Abnormal; Notable for the following components:   Glucose-Capillary 310 (*)    All other components within normal limits  POC URINE PREG, ED    EKG None  Radiology No results found.  Procedures .Marland Kitchen.Incision and Drainage  Date/Time: 05/28/2019 9:15 AM Performed by: Donnetta Hutchingook, Kindred Heying, MD Authorized by: Donnetta Hutchingook, Kashena Novitski, MD   Consent:    Consent obtained:  Verbal   Consent given by:  Patient   Risks discussed:  Pain Location:    Type:  Abscess   Size:  3.0 cm   Location:  Trunk   Trunk location:  Abdomen Pre-procedure details:    Skin preparation:  Betadine Anesthesia (see MAR for exact dosages):    Anesthesia method:   Local infiltration   Local anesthetic:  Lidocaine 2% WITH epi Procedure type:    Complexity:  Complex Procedure details:    Needle aspiration: no     Incision types:  Stab incision   Incision depth:  Subcutaneous   Scalpel blade:  11   Wound management:  Probed and deloculated   Drainage:  Purulent   Drainage amount:  Moderate   Wound treatment:  Drain placed   Packing materials:  1/4 in iodoform gauze Post-procedure details:    Patient tolerance of procedure:  Tolerated well, no immediate complications   (including critical care time)  Medications Ordered in ED Medications  vancomycin (VANCOCIN) IVPB 1000 mg/200 mL premix (1,000 mg Intravenous New Bag/Given 05/28/19 0904)  povidone-iodine (BETADINE) 10 % external solution (has no administration in time range)  dextrose 5 %-0.45 % sodium chloride infusion (has no administration in time range)  insulin regular, human (MYXREDLIN) 100 units/ 100 mL infusion (has no administration in time range)  HYDROmorphone (DILAUDID) injection 1 mg (1 mg Intravenous Given 05/28/19 0901)  lidocaine-EPINEPHrine (XYLOCAINE W/EPI) 2 %-1:200000 (PF) injection 20 mL (20 mLs Infiltration Given 05/28/19 0903)  sodium chloride 0.9 % bolus 1,000 mL (1,000 mLs Intravenous New Bag/Given 05/28/19 0904)     Initial Impression / Assessment and Plan / ED Course  I have reviewed the triage vital signs and the nursing notes.  Pertinent labs & imaging results that were available during my care of the patient were reviewed by me and considered in my medical decision making (see chart for details).        Patient presents with abdominal wall abscess/cellulitis.  Additionally she is in new mild DKA.  Incision and drainage procedure per examiner.  IV fluids, IV insulin, admit.   CRITICAL CARE Performed by: Nat Christen Total critical care time: 30 minutes Critical care time was exclusive of separately billable procedures and treating other patients. Critical care  was necessary to treat or prevent imminent or life-threatening deterioration. Critical care was time spent personally by me on the following activities: development of treatment plan with patient and/or surrogate as well as nursing, discussions with consultants, evaluation of patient's response to treatment, examination of patient, obtaining history from patient or surrogate, ordering and performing treatments and interventions, ordering and review of laboratory studies, ordering and review of radiographic studies, pulse oximetry and re-evaluation of patient's condition.  Final Clinical Impressions(s) / ED Diagnoses   Final diagnoses:  Abdominal wall abscess  Type 2 diabetes mellitus with hyperglycemia, without long-term current use of insulin ([place])  Open abdominal incision with drainage, initial encounter    ED Discharge Orders    None       Nat Christen, MD 05/28/19 1003

## 2019-05-28 NOTE — Progress Notes (Signed)
Consult request has been received. CSW attempting to follow up at present time  Sharetta Ricchio M. Alwin Lanigan LCSWA Transitions of Care  Clinical Social Worker  Ph: [phone] 

## 2019-05-28 NOTE — Progress Notes (Signed)
Dietary at bedside.

## 2019-05-28 NOTE — Plan of Care (Signed)
Nutrition Education Note  RD consulted for diet education regarding new onset DM2. Pt reports she is already fairly familiar with a DM diet/lifestyle. She says her father died from DM-related complications and she is motivated to not follow in his footsteps.   Lab Results  Component Value Date   HGBA1C 10.4 (H) 05/28/2019   Took diet recall. Patient readily admits to only eating one small a deal (usually a microwave burrito or sandwich). She also drinks several types of sugar sweetened beverages, namely Dr. Malachi Bonds (2 per day), Crangrape juice (2 per day) and "half/half" sweet tea.   Because of her experiences with her parents DM, she is relatively familiar with the diet recommendations and what foods will increase BG. She shares that several years ago she made several healthy changes, giving up regular sodas and exercising more frequently. She lost weight, but says when she finally achieved her goal she lost the motivation to continue following her healthy habits and went back to her unhealthy ones.   Recommendations RD advocated for "life-long changes". A few simple changes will allow her to both lose weight and improve her glycemic control. First and foremost, she needs to break her habit of only eating 1 small meal a day. RD explained how this counter intuitively will increase her body's glycemic response to meals and lead to BG peaks/valleys. Unsurprisingly, she says she is not hungry in mornings or in evenings. Detailed how her body has adapted to this disorganized eating pattern and she will have to make herself eat until her body's routine is realigned. Emphasized how this will become even more important if she is d/cd on insulin, as meal skipping will run the risk of hypoglycemic episodes.   Secondly, while not excessive, eliminating her intake of Sugar sweetened beverages will greatly improve her glycemic control and also help her to lose weight. RD recommended switching back to diet soda,  for good this time, and to decrease her juice intake to 1x a day. She was agreeable.   We reviewed the plate method and the ideal food group ratios at meals. Her meals should be comprised of 25% carb/starch and 75% protein/veg. We reviewed the starchy vegetables; she denied eating any of these w/ any type of frequency.   Summary of Recommendations: Eat 3x a day atleast. Do NOT skip meals. You may need to force yourself to eat until your body readapts to a normal eating routine. Eliminate or drastically reduce sugary beverage intake. Switch to diet soda and aim for only 1 cup of juice a day Follow the plate method. As discussed, try to make 75% of your meals either protein or non-starchy vegetables. Never eat a carb by itself! Exercise will both directly and indirectly (via weight loss) improve your BG control   Expect Good compliance. There does not sound to be a knowledge gap. Pt knows what habits she needs to change; its just a matter of doing and maintaining them, as she had made healthy changes in the past only to relapse.   No further nutrition interventions warranted at this time. If additional nutrition issues arise, please re-consult RD.  Burtis Junes RD, LDN, CNSC Clinical Nutrition Available Tues-Sat via Pager: [phone] 05/28/2019 4:49 PM

## 2019-05-28 NOTE — ED Triage Notes (Signed)
Pt has an abscess to the upper left portion of vagina. Tried hot compresses to alleviate pain and swelling, but did not help. Has had chills. No fever present while in ED.

## 2019-05-29 DIAGNOSIS — L02211 Cutaneous abscess of abdominal wall: Secondary | ICD-10-CM

## 2019-05-29 LAB — BASIC METABOLIC PANEL
Anion gap: 7 (ref 5–15)
BUN: 7 mg/dL (ref 6–20)
CO2: 26 mmol/L (ref 22–32)
Calcium: 8.7 mg/dL — ABNORMAL LOW (ref 8.9–10.3)
Chloride: 107 mmol/L (ref 98–111)
Creatinine, Ser: 0.48 mg/dL (ref 0.44–1.00)
GFR calc Af Amer: >60 mL/min (ref >60)
GFR calc non Af Amer: >60 mL/min (ref >60)
Glucose, Bld: 184 mg/dL — ABNORMAL HIGH (ref 70–99)
Potassium: 3.7 mmol/L (ref 3.5–5.1)
Sodium: 140 mmol/L (ref 135–145)

## 2019-05-29 LAB — HIV ANTIBODY (ROUTINE TESTING W REFLEX): HIV Screen 4th Generation wRfx: NONREACTIVE

## 2019-05-29 LAB — GLUCOSE, CAPILLARY
Glucose-Capillary: 192 mg/dL — ABNORMAL HIGH (ref 70–99)
Glucose-Capillary: 202 mg/dL — ABNORMAL HIGH (ref 70–99)
Glucose-Capillary: 294 mg/dL — ABNORMAL HIGH (ref 70–99)

## 2019-05-29 LAB — MAGNESIUM: Magnesium: 1.8 mg/dL (ref 1.7–2.4)

## 2019-05-29 LAB — VITAMIN D 25 HYDROXY (VIT D DEFICIENCY, FRACTURES): Vit D, 25-Hydroxy: 36 ng/mL (ref 30.0–100.0)

## 2019-05-29 MED ORDER — BLOOD GLUCOSE METER KIT: PACK | 0 refills | Status: DC

## 2019-05-29 MED ORDER — DOXYCYCLINE HYCLATE 100 MG PO CAPS: 100.0000 mg | ORAL_CAPSULE | Freq: Two times a day (BID) | ORAL | 0 refills | Status: AC

## 2019-05-29 MED ORDER — METFORMIN HCL ER 500 MG PO TB24: ORAL_TABLET | ORAL | 0 refills | Status: DC

## 2019-05-29 MED ORDER — INSULIN NPH ISOPHANE & REGULAR (70-30) 100 UNIT/ML ~~LOC~~ SUSP: 20.0000 [IU] | Freq: Two times a day (BID) | SUBCUTANEOUS | 1 refills | Status: DC

## 2019-05-29 MED ORDER — VITAMIN D 50 MCG (2000 UT) PO TABS: 2000.0000 [IU] | ORAL_TABLET | Freq: Every day | ORAL | Status: DC

## 2019-05-29 MED ORDER — INSULIN PEN NEEDLE 31G X 8 MM MISC: 1.0000 [IU] | 1 refills | Status: DC

## 2019-05-29 MED ORDER — INSULIN ASPART PROT & ASPART (70-30 MIX) 100 UNIT/ML ~~LOC~~ SUSP
20.0000 [IU] | Freq: Two times a day (BID) | SUBCUTANEOUS | Status: DC
  Administered 2019-05-29: 20 [IU] via SUBCUTANEOUS
  Filled 2019-05-29: qty 10

## 2019-05-29 NOTE — Progress Notes (Signed)
Per MD Wynetta Emery verbal order, packing was removed from LLQ and replaced with approx 8.5 inches of 0.25" packing. Dry, non-stick gauze placed over packing and secured with non-stick tape. Pt educated on removing packing tomorrow and placing sterile dressings to site. At this time pt was educated on DM, medication changes, and follow up care. Pt wished to have her mother on the phone during d/c instructions. Both mother and pt states understanding, denies any questions at this time. IVs removed and dressing placed. Pt's clothing, wallet, cell phone, and charger returned.

## 2019-05-29 NOTE — Discharge Instructions (Signed)
You met with a dietitian during your hospitalization. Here is a Summary of Recommendations from your discussion: 1. Eat 3x a day atleast. Do NOT skip meals. You may need to force yourself to eat until your body readapts to a normal eating routine. 2. Eliminate or drastically reduce sugary beverage intake. Switch to diet soda and aim for only 1 cup of juice a day 3. Follow the plate method. As discussed, try to make 75% of your meals either protein or non-starchy vegetables. Never eat a carb by itself! 4. Exercise will both directly and indirectly (via weight loss) improve your BG control    PLEASE CHECK BLOOD SUGARS 4 TIMES PER DAY.  REDUCE INSULIN DOSE BY 5 UNITS IF YOU HAVE ANY BLOOD SUGARS LESS THAN 100.   STOP TAKING INSULIN IF YOU HAVE ANY BLOOD SUGAR LESS THAN 80 AND CALL YOUR PRIMARY CARE DOCTOR.   DO NOT TAKE INSULIN IF YOU DO NOT EAT.  ONLY TAKE INSULIN IF YOU ARE EATING A MEAL.     Diabetes Mellitus and Nutrition, Adult When you have diabetes (diabetes mellitus), it is very important to have healthy eating habits because your blood sugar (glucose) levels are greatly affected by what you eat and drink. Eating healthy foods in the appropriate amounts, at about the same times every day, can help you:  Control your blood glucose.  Lower your risk of heart disease.  Improve your blood pressure.  Reach or maintain a healthy weight. Every person with diabetes is different, and each person has different needs for a meal plan. Your health care provider may recommend that you work with a diet and nutrition specialist (dietitian) to make a meal plan that is best for you. Your meal plan may vary depending on factors such as:  The calories you need.  The medicines you take.  Your weight.  Your blood glucose, blood pressure, and cholesterol levels.  Your activity level.  Other health conditions you have, such as heart or kidney disease. How do carbohydrates affect me? Carbohydrates,  also called carbs, affect your blood glucose level more than any other type of food. Eating carbs naturally raises the amount of glucose in your blood. Carb counting is a method for keeping track of how many carbs you eat. Counting carbs is important to keep your blood glucose at a healthy level, especially if you use insulin or take certain oral diabetes medicines. It is important to know how many carbs you can safely have in each meal. This is different for every person. Your dietitian can help you calculate how many carbs you should have at each meal and for each snack. Foods that contain carbs include:  Bread, cereal, rice, pasta, and crackers.  Potatoes and corn.  Peas, beans, and lentils.  Milk and yogurt.  Fruit and juice.  Desserts, such as cakes, cookies, ice cream, and candy. How does alcohol affect me? Alcohol can cause a sudden decrease in blood glucose (hypoglycemia), especially if you use insulin or take certain oral diabetes medicines. Hypoglycemia can be a life-threatening condition. Symptoms of hypoglycemia (sleepiness, dizziness, and confusion) are similar to symptoms of having too much alcohol. If your health care provider says that alcohol is safe for you, follow these guidelines:  Limit alcohol intake to no more than 1 drink per day for nonpregnant women and 2 drinks per day for men. One drink equals 12 oz of beer, 5 oz of wine, or 1 oz of hard liquor.  Do not drink on  an empty stomach.  Keep yourself hydrated with water, diet soda, or unsweetened iced tea.  Keep in mind that regular soda, juice, and other mixers may contain a lot of sugar and must be counted as carbs. What are tips for following this plan?  Reading food labels  Start by checking the serving size on the "Nutrition Facts" label of packaged foods and drinks. The amount of calories, carbs, fats, and other nutrients listed on the label is based on one serving of the item. Many items contain more than  one serving per package.  Check the total grams (g) of carbs in one serving. You can calculate the number of servings of carbs in one serving by dividing the total carbs by 15. For example, if a food has 30 g of total carbs, it would be equal to 2 servings of carbs.  Check the number of grams (g) of saturated and trans fats in one serving. Choose foods that have low or no amount of these fats.  Check the number of milligrams (mg) of salt (sodium) in one serving. Most people should limit total sodium intake to less than 2,300 mg per day.  Always check the nutrition information of foods labeled as "low-fat" or "nonfat". These foods may be higher in added sugar or refined carbs and should be avoided.  Talk to your dietitian to identify your daily goals for nutrients listed on the label. Shopping  Avoid buying canned, premade, or processed foods. These foods tend to be high in fat, sodium, and added sugar.  Shop around the outside edge of the grocery store. This includes fresh fruits and vegetables, bulk grains, fresh meats, and fresh dairy. Cooking  Use low-heat cooking methods, such as baking, instead of high-heat cooking methods like deep frying.  Cook using healthy oils, such as olive, canola, or sunflower oil.  Avoid cooking with butter, cream, or high-fat meats. Meal planning  Eat meals and snacks regularly, preferably at the same times every day. Avoid going long periods of time without eating.  Eat foods high in fiber, such as fresh fruits, vegetables, beans, and whole grains. Talk to your dietitian about how many servings of carbs you can eat at each meal.  Eat 4-6 ounces (oz) of lean protein each day, such as lean meat, chicken, fish, eggs, or tofu. One oz of lean protein is equal to: ? 1 oz of meat, chicken, or fish. ? 1 egg. ?  cup of tofu.  Eat some foods each day that contain healthy fats, such as avocado, nuts, seeds, and fish. Lifestyle  Check your blood glucose  regularly.  Exercise regularly as told by your health care provider. This may include: ? 150 minutes of moderate-intensity or vigorous-intensity exercise each week. This could be brisk walking, biking, or water aerobics. ? Stretching and doing strength exercises, such as yoga or weightlifting, at least 2 times a week.  Take medicines as told by your health care provider.  Do not use any products that contain nicotine or tobacco, such as cigarettes and e-cigarettes. If you need help quitting, ask your health care provider.  Work with a Social worker or diabetes educator to identify strategies to manage stress and any emotional and social challenges. Questions to ask a health care provider  Do I need to meet with a diabetes educator?  Do I need to meet with a dietitian?  What number can I call if I have questions?  When are the best times to check my blood glucose?  Where to find more information:  American Diabetes Association: diabetes.org  Academy of Nutrition and Dietetics: www.eatright.[employer] of Diabetes and Digestive and Kidney Diseases (NIH): [employer] Summary  A healthy meal plan will help you control your blood glucose and maintain a healthy lifestyle.  Working with a diet and nutrition specialist (dietitian) can help you make a meal plan that is best for you.  Keep in mind that carbohydrates (carbs) and alcohol have immediate effects on your blood glucose levels. It is important to count carbs and to use alcohol carefully. This information is not intended to replace advice given to you by your health care provider. Make sure you discuss any questions you have with your health care provider. Document Released: 08/29/2005 Document Revised: 07/02/2017 Document Reviewed: 01/06/2017 Elsevier Interactive Patient Education  2019 [place].   Blood Glucose Monitoring, Adult Monitoring your blood sugar (glucose) is an important part of managing your  diabetes (diabetes mellitus). Blood glucose monitoring involves checking your blood glucose as often as directed and keeping a record (log) of your results over time. Checking your blood glucose regularly and keeping a blood glucose log can:  Help you and your health care provider adjust your diabetes management plan as needed, including your medicines or insulin.  Help you understand how food, exercise, illnesses, and medicines affect your blood glucose.  Let you know what your blood glucose is at any time. You can quickly find out if you have low blood glucose (hypoglycemia) or high blood glucose (hyperglycemia). Your health care provider will set individualized treatment goals for you. Your goals will be based on your age, other medical conditions you have, and how you respond to diabetes treatment. Generally, the goal of treatment is to maintain the following blood glucose levels:  Before meals (preprandial): 80-130 mg/dL (4.4-7.2 mmol/L).  After meals (postprandial): below 180 mg/dL (10 mmol/L).  A1c level: less than 7%. Supplies needed:  Blood glucose meter.  Test strips for your meter. Each meter has its own strips. You must use the strips that came with your meter.  A needle to prick your finger (lancet). Do not use a lancet more than one time.  A device that holds the lancet (lancing device).  A journal or log book to write down your results. How to check your blood glucose  1. Wash your hands with soap and water. 2. Prick the side of your finger (not the tip) with the lancet. Use a different finger each time. 3. Gently rub the finger until a small drop of blood appears. 4. Follow instructions that come with your meter for inserting the test strip, applying blood to the strip, and using your blood glucose meter. 5. Write down your result and any notes. Some meters allow you to use areas of your body other than your finger (alternative sites) to test your blood. The most  common alternative sites are:  Forearm.  Thigh.  Palm of the hand. If you think you may have hypoglycemia, or if you have a history of not knowing when your blood glucose is getting low (hypoglycemia unawareness), do not use alternative sites. Use your finger instead. Alternative sites may not be as accurate as the fingers, because blood flow is slower in these areas. This means that the result you get may be delayed, and it may be different from the result that you would get from your finger. Follow these instructions at home: Blood glucose log   Every time you check your blood  glucose, write down your result. Also write down any notes about things that may be affecting your blood glucose, such as your diet and exercise for the day. This information can help you and your health care provider: ? Look for patterns in your blood glucose over time. ? Adjust your diabetes management plan as needed.  Check if your meter allows you to download your records to a computer. Most glucose meters store a record of glucose readings in the meter. If you have type 1 diabetes:  Check your blood glucose 2 or more times a day.  Also check your blood glucose: ? Before every insulin injection. ? Before and after exercise. ? Before meals. ? 2 hours after a meal. ? Occasionally between 2:00 a.m. and 3:00 a.m., as directed. ? Before potentially dangerous tasks, like driving or using heavy machinery. ? At bedtime.  You may need to check your blood glucose more often, up to 6-10 times a day, if you: ? Use an insulin pump. ? Need multiple daily injections (MDI). ? Have diabetes that is not well-controlled. ? Are ill. ? Have a history of severe hypoglycemia. ? Have hypoglycemia unawareness. If you have type 2 diabetes:  If you take insulin or other diabetes medicines, check your blood glucose 2 or more times a day.  If you are on intensive insulin therapy, check your blood glucose 4 or more times a  day. Occasionally, you may also need to check between 2:00 a.m. and 3:00 a.m., as directed.  Also check your blood glucose: ? Before and after exercise. ? Before potentially dangerous tasks, like driving or using heavy machinery.  You may need to check your blood glucose more often if: ? Your medicine is being adjusted. ? Your diabetes is not well-controlled. ? You are ill. General tips  Always keep your supplies with you.  If you have questions or need help, all blood glucose meters have a 24-hour "hotline" phone number that you can call. You may also contact your health care provider.  After you use a few boxes of test strips, adjust (calibrate) your blood glucose meter by following instructions that came with your meter. Contact a health care provider if:  Your blood glucose is at or above 240 mg/dL (13.3 mmol/L) for 2 days in a row.  You have been sick or have had a fever for 2 days or longer, and you are not getting better.  You have any of the following problems for more than 6 hours: ? You cannot eat or drink. ? You have nausea or vomiting. ? You have diarrhea. Get help right away if:  Your blood glucose is lower than 54 mg/dL (3 mmol/L).  You become confused or you have trouble thinking clearly.  You have difficulty breathing.  You have moderate or large ketone levels in your urine. Summary  Monitoring your blood sugar (glucose) is an important part of managing your diabetes (diabetes mellitus).  Blood glucose monitoring involves checking your blood glucose as often as directed and keeping a record (log) of your results over time.  Your health care provider will set individualized treatment goals for you. Your goals will be based on your age, other medical conditions you have, and how you respond to diabetes treatment.  Every time you check your blood glucose, write down your result. Also write down any notes about things that may be affecting your blood glucose,  such as your diet and exercise for the day. This information is not intended  to replace advice given to you by your health care provider. Make sure you discuss any questions you have with your health care provider. Document Released: 12/05/2003 Document Revised: 10/13/2017 Document Reviewed: 05/13/2016 Elsevier Interactive Patient Education  2019 [place].   Diabetes Mellitus and Sick Day Management Blood sugar (glucose) can be difficult to control when you are sick. Common illnesses that can cause problems for people with diabetes (diabetes mellitus) include colds, fever, flu (influenza), nausea, vomiting, and diarrhea. These illnesses can cause stress and loss of body fluids (dehydration), and those issues can cause blood glucose levels to increase. Because of this, it is very important to take your insulin and diabetes medicines and eat some form of carbohydrate when you are sick. You should make a plan for days when you are sick (sick day plan) as part of your diabetes management plan. You and your health care provider should make this plan in advance. The following guidelines are intended to help you manage an illness that lasts for about 24 hours or less. Your health care provider may also give you more specific instructions. What do I need to do to manage my blood glucose?   Check your blood glucose every 2-4 hours, or as often as told by your health care provider.  Know your sick day treatment goals. Your target blood glucose levels may be different when you are sick.  If you use insulin, take your usual dose. ? If your blood glucose continues to be too high, you may need to take an additional insulin dose as told by your health care provider.  If you use oral diabetes medicine, you may need to stop taking it if you are not able to eat or drink normally. Ask your health care provider about whether you need to stop taking these medicines while you are sick.  If you use injectable  hormone medicines other than insulin to control your diabetes, ask your health care provider about whether you need to stop taking these medicines while you are sick. What else can I do to manage my diabetes when I am sick? Check your ketones  If you have type 1 diabetes, check your urine ketones every 4 hours.  If you have type 2 diabetes, check your urine ketones as often as told by your health care provider. Drink fluids  Drink enough fluid to keep your urine clear or pale yellow. This is especially important if you have a fever, vomiting, or diarrhea. Those symptoms can lead to dehydration.  Follow any instructions from your health care provider about beverages to avoid. ? Do not drink alcohol, caffeine, or drinks that contain a lot of sugar. Take medicines as directed  Take-over-the-counter and prescription medicines only as told by your health care provider.  Check medicine labels for added sugars. Some medicines may contain sugar or types of sugars that can raise your blood glucose level. What foods can I eat when I am sick?  You need to eat some form of carbohydrates when you are sick. You should eat 45-50 grams (45-50 g) of carbohydrates every 3-4 hours until you feel better. All of the food choices below contain about 15 g of carbohydrates. Plan ahead and keep some of these foods around so you have them if you get sick.  4-6 oz (120-177 mL) carbonated beverage that contains sugar, such as regular (not diet) soda. You may be able to drink carbonated beverages more easily if you open the beverage and let it sit  at room temperature for a few minutes before drinking.   of a twin frozen ice pop.  4 oz (120 g) regular gelatin.  4 oz (120 mL) fruit juice.  4 oz (120 g) ice cream or frozen yogurt.  2 oz (60 g) sherbet.  8 oz (240 mL) clear broth or soup.  4 oz (120 g) regular custard.  4 oz (120 g) regular pudding.  8 oz (240 g) plain yogurt.  1 slice bread or  toast.  6 saltine crackers.  5 vanilla wafers. Questions to ask your health care provider Consider asking the following questions so you know what to do on days when you are sick:  Should I adjust my diabetes medicines?  How often do I need to check my blood glucose?  What supplies do I need to manage my diabetes at home when I am sick?  What number can I call if I have questions?  What foods and drinks should I avoid? Contact a health care provider if:  You develop symptoms of diabetic ketoacidosis, such as: ? Fatigue. ? Weight loss. ? Excessive thirst. ? Light-headedness. ? Fruity or sweet-smelling breath. ? Excessive urination. ? Vision changes. ? Confusion or irritability. ? Nausea. ? Vomiting. ? Rapid breathing. ? Pain in the abdomen. ? Feeling flushed.  You are unable to drink fluids without vomiting.  You have any of the following for more than 6 hours: ? Nausea. ? Vomiting. ? Diarrhea.  Your blood glucose is at or above 240 mg/dL (13.3 mmol/L), even after you take an additional insulin dose.  You have a change in how you think, feel, or act (mental status).  You develop another serious illness.  You have been sick or have had a fever for 2 days or longer and you are not getting better. Get help right away if:  Your blood glucose is lower than 54 mg/dL (3.0 mmol/L).  You have difficulty breathing.  You have moderate or high ketone levels in your urine.  You used emergency glucagon to treat low blood glucose. Summary  Blood sugar (glucose) can be difficult to control when you are sick. Common illnesses that can cause problems for people with diabetes (diabetes mellitus) include colds, fever, flu (influenza), nausea, vomiting, and diarrhea.  Illnesses can cause stress and loss of body fluids (dehydration), and those issues can cause blood glucose levels to increase.  Make a plan for days when you are sick (sick day plan) as part of your diabetes  management plan. You and your health care provider should make this plan in advance.  It is very important to take your insulin and diabetes medicines and to eat some form of carbohydrate when you are sick.  Contact your health care provider if have problems managing your blood glucose levels when you are sick, or if you have been sick or had a fever for 2 days or longer and are not getting better. This information is not intended to replace advice given to you by your health care provider. Make sure you discuss any questions you have with your health care provider. Document Released: 12/05/2003 Document Revised: 08/30/2016 Document Reviewed: 08/30/2016 Elsevier Interactive Patient Education  2019 [place].   Diabetes Mellitus and Exercise Exercising regularly is important for your overall health, especially when you have diabetes (diabetes mellitus). Exercising is not only about losing weight. It has many other health benefits, such as increasing muscle strength and bone density and reducing body fat and stress. This leads to  improved fitness, flexibility, and endurance, all of which result in better overall health. Exercise has additional benefits for people with diabetes, including:  Reducing appetite.  Helping to lower and control blood glucose.  Lowering blood pressure.  Helping to control amounts of fatty substances (lipids) in the blood, such as cholesterol and triglycerides.  Helping the body to respond better to insulin (improving insulin sensitivity).  Reducing how much insulin the body needs.  Decreasing the risk for heart disease by: ? Lowering cholesterol and triglyceride levels. ? Increasing the levels of good cholesterol. ? Lowering blood glucose levels. What is my activity plan? Your health care provider or certified diabetes educator can help you make a plan for the type and frequency of exercise (activity plan) that works for you. Make sure that you:  Do at  least 150 minutes of moderate-intensity or vigorous-intensity exercise each week. This could be brisk walking, biking, or water aerobics. ? Do stretching and strength exercises, such as yoga or weightlifting, at least 2 times a week. ? Spread out your activity over at least 3 days of the week.  Get some form of physical activity every day. ? Do not go more than 2 days in a row without some kind of physical activity. ? Avoid being inactive for more than 30 minutes at a time. Take frequent breaks to walk or stretch.  Choose a type of exercise or activity that you enjoy, and set realistic goals.  Start slowly, and gradually increase the intensity of your exercise over time. What do I need to know about managing my diabetes?   Check your blood glucose before and after exercising. ? If your blood glucose is 240 mg/dL (13.3 mmol/L) or higher before you exercise, check your urine for ketones. If you have ketones in your urine, do not exercise until your blood glucose returns to normal. ? If your blood glucose is 100 mg/dL (5.6 mmol/L) or lower, eat a snack containing 15-20 grams of carbohydrate. Check your blood glucose 15 minutes after the snack to make sure that your level is above 100 mg/dL (5.6 mmol/L) before you start your exercise.  Know the symptoms of low blood glucose (hypoglycemia) and how to treat it. Your risk for hypoglycemia increases during and after exercise. Common symptoms of hypoglycemia can include: ? Hunger. ? Anxiety. ? Sweating and feeling clammy. ? Confusion. ? Dizziness or feeling light-headed. ? Increased heart rate or palpitations. ? Blurry vision. ? Tingling or numbness around the mouth, lips, or tongue. ? Tremors or shakes. ? Irritability.  Keep a rapid-acting carbohydrate snack available before, during, and after exercise to help prevent or treat hypoglycemia.  Avoid injecting insulin into areas of the body that are going to be exercised. For example, avoid  injecting insulin into: ? The arms, when playing tennis. ? The legs, when jogging.  Keep records of your exercise habits. Doing this can help you and your health care provider adjust your diabetes management plan as needed. Write down: ? Food that you eat before and after you exercise. ? Blood glucose levels before and after you exercise. ? The type and amount of exercise you have done. ? When your insulin is expected to peak, if you use insulin. Avoid exercising at times when your insulin is peaking.  When you start a new exercise or activity, work with your health care provider to make sure the activity is safe for you, and to adjust your insulin, medicines, or food intake as needed.  Drink plenty  of water while you exercise to prevent dehydration or heat stroke. Drink enough fluid to keep your urine clear or pale yellow. Summary  Exercising regularly is important for your overall health, especially when you have diabetes (diabetes mellitus).  Exercising has many health benefits, such as increasing muscle strength and bone density and reducing body fat and stress.  Your health care provider or certified diabetes educator can help you make a plan for the type and frequency of exercise (activity plan) that works for you.  When you start a new exercise or activity, work with your health care provider to make sure the activity is safe for you, and to adjust your insulin, medicines, or food intake as needed. This information is not intended to replace advice given to you by your health care provider. Make sure you discuss any questions you have with your health care provider. Document Released: 02/22/2004 Document Revised: 06/12/2017 Document Reviewed: 05/13/2016 Elsevier Interactive Patient Education  2019 [employer]     Diabetic Ketoacidosis Diabetic ketoacidosis is a serious complication of diabetes. This condition develops when there is not enough insulin in the body. Insulin is an  hormone that regulates blood sugar levels in the body. Normally, insulin allows glucose to enter the cells in the body. The cells break down glucose for energy. Without enough insulin, the body cannot break down glucose, so it breaks down fats instead. This leads to high blood glucose levels in the body and the production of acids that are called ketones. Ketones are poisonous at high levels. If diabetic ketoacidosis is not treated, it can cause severe dehydration and can lead to a coma or death. What are the causes? This condition develops when a lack of insulin causes the body to break down fats instead of glucose. This may be triggered by:  Stress on the body. This stress is brought on by an illness.  Infection.  Medicines that raise blood glucose levels.  Not taking diabetes medicine.  New onset of type 1 diabetes mellitus. What are the signs or symptoms? Symptoms of this condition include:  Fatigue.  Weight loss.  Excessive thirst.  Light-headedness.  Fruity or sweet-smelling breath.  Excessive urination.  Vision changes.  Confusion or irritability.  Nausea.  Vomiting.  Rapid breathing.  Abdominal pain.  Feeling flushed. How is this diagnosed? This condition is diagnosed based on your medical history, a physical exam, and blood tests. You may also have a urine test to check for ketones. How is this treated? This condition may be treated with:  Fluid replacement. This may be done to correct dehydration.  Insulin injections. These may be given through the skin or through an IV tube.  Electrolyte replacement. Electrolytes are minerals in your blood. Electrolytes such as potassium and sodium may be given in pill form or through an IV tube.  Antibiotic medicines. These may be prescribed if your condition was caused by an infection. Diabetic ketoacidosis is a serious medical condition. You may need emergency treatment in the hospital to monitor your  condition. Follow these instructions at home: Eating and drinking  Drink enough fluids to keep your urine clear or pale yellow.  If you are not able to eat, drink clear fluids in small amounts as you are able. Clear fluids include water, ice chips, fruit juice with water added (diluted), and low-calorie sports drinks. You may also have sugar-free jello or popsicles.  If you are able to eat, follow your usual diet and drink sugar-free liquids, such as  water. Medicines  Take over-the-counter and prescription medicines only as told by your health care provider.  Continue to take insulin and other diabetes medicines as told by your health care provider.  If you were prescribed an antibiotic, take it as told by your health care provider. Do not stop taking the antibiotic even if you start to feel better. General instructions   Check your urine for ketones when you are ill and as told by your health care provider. ? If your blood glucose is 240 mg/dL (13.3 mmol/L) or higher, check your urine ketones every 4-6 hours.  Check your blood glucose every day, as often as told by your health care provider. ? If your blood glucose is high, drink plenty of fluids. This helps to flush out ketones. ? If your blood glucose is above your target for 2 tests in a row, contact your health care provider.  Carry a medical alert card or wear medical alert jewelry that says that you have diabetes.  Rest and exercise only as told by your health care provider. Do not exercise when your blood glucose is high and you have ketones in your urine.  If you get sick, call your health care provider and begin treatment quickly. Your body often needs extra insulin to fight an illness. Check your blood glucose every 4-6 hours when you are sick.  Keep all follow-up visits as told by your health care provider. This is important. Contact a health care provider if:  Your blood glucose level is higher than 240 mg/dL (13.3  mmol/L) for 2 days in a row.  You have moderate or large ketones in your urine.  You have a fever.  You cannot eat or drink without vomiting.  You have been vomiting for more than 2 hours.  You continue to have symptoms of diabetic ketoacidosis.  You develop new symptoms. Get help right away if:  Your blood glucose monitor reads high even when you are taking insulin.  You faint.  You have chest pain.  You have trouble breathing.  You have sudden trouble speaking or swallowing.  You have vomiting or diarrhea that gets worse after 3 hours.  You are unable to stay awake.  You have trouble thinking.  You are severely dehydrated. Symptoms of severe dehydration include: ? Extreme thirst. ? Dry mouth. ? Rapid breathing. These symptoms may represent a serious problem that is an emergency. Do not wait to see if the symptoms will go away. Get medical help right away. Call your local emergency services (911 in the [place] Do not drive yourself to the hospital. Summary  Diabetic ketoacidosis is a serious complication of diabetes. This condition develops when there is not enough insulin in the body.  This condition is diagnosed based on your medical history, a physical exam, and blood tests. You may also have a urine test to check for ketones.  Diabetic ketoacidosis is a serious medical condition. You may need emergency treatment in the hospital to monitor your condition.  Contact your health care provider if your blood glucose is higher than 240 mg/dl for 2 days in a row or if you have moderate or large ketones in your urine. This information is not intended to replace advice given to you by your health care provider. Make sure you discuss any questions you have with your health care provider. Document Released: 11/29/2000 Document Revised: 01/06/2017 Document Reviewed: 01/06/2017 Elsevier Interactive Patient Education  2019 [employer].   Hypoglycemia Hypoglycemia is when  the sugar (glucose) level in your blood is too low. Signs of low blood sugar may include:  Feeling: ? Hungry. ? Worried or nervous (anxious). ? Sweaty and clammy. ? Confused. ? Dizzy. ? Sleepy. ? Sick to your stomach (nauseous).  Having: ? A fast heartbeat. ? A headache. ? A change in your vision. ? Tingling or no feeling (numbness) around your mouth, lips, or tongue. ? Jerky movements that you cannot control (seizure).  Having trouble with: ? Moving (coordination). ? Sleeping. ? Passing out (fainting). ? Getting upset easily (irritability). Low blood sugar can happen to people who have diabetes and people who do not have diabetes. Low blood sugar can happen quickly, and it can be an emergency. Treating low blood sugar Low blood sugar is often treated by eating or drinking something sugary right away, such as:  Fruit juice, 4-6 oz (120-150 mL).  Regular soda (not diet soda), 4-6 oz (120-150 mL).  Low-fat milk, 4 oz (120 mL).  Several pieces of hard candy.  Sugar or honey, 1 Tbsp (15 mL). Treating low blood sugar if you have diabetes If you can think clearly and swallow safely, follow the 15:15 rule:  Take 15 grams of a fast-acting carb (carbohydrate). Talk with your doctor about how much you should take.  Always keep a source of fast-acting carb with you, such as: ? Sugar tablets (glucose pills). Take 3-4 pills. ? 6-8 pieces of hard candy. ? 4-6 oz (120-150 mL) of fruit juice. ? 4-6 oz (120-150 mL) of regular (not diet) soda. ? 1 Tbsp (15 mL) honey or sugar.  Check your blood sugar 15 minutes after you take the carb.  If your blood sugar is still at or below 70 mg/dL (3.9 mmol/L), take 15 grams of a carb again.  If your blood sugar does not go above 70 mg/dL (3.9 mmol/L) after 3 tries, get help right away.  After your blood sugar goes back to normal, eat a meal or a snack within 1 hour.  Treating very low blood sugar If your blood sugar is at or below 54 mg/dL  (3 mmol/L), you have very low blood sugar (severe hypoglycemia). This may also cause:  Passing out.  Jerky movements you cannot control (seizure).  Losing consciousness (coma). This is an emergency. Do not wait to see if the symptoms will go away. Get medical help right away. Call your local emergency services (911 in the [place] Do not drive yourself to the hospital. If you have very low blood sugar and you cannot eat or drink, you may need a glucagon shot (injection). A family member or friend should learn how to check your blood sugar and how to give you a glucagon shot. Ask your doctor if you need to have a glucagon shot kit at home. Follow these instructions at home: General instructions  Take over-the-counter and prescription medicines only as told by your doctor.  Stay aware of your blood sugar as told by your doctor.  Limit alcohol intake to no more than 1 drink a day for nonpregnant women and 2 drinks a day for men. One drink equals 12 oz of beer (355 mL), 5 oz of wine (148 mL), or 1 oz of hard liquor (44 mL).  Keep all follow-up visits as told by your doctor. This is important. If you have diabetes:   Follow your diabetes care plan as told by your doctor. Make sure you: ? Know the signs of low blood sugar. ? Take your medicines  as told. ? Follow your exercise and meal plan. ? Eat on time. Do not skip meals. ? Check your blood sugar as often as told by your doctor. Always check it before and after exercise. ? Follow your sick day plan when you cannot eat or drink normally. Make this plan ahead of time with your doctor.  Share your diabetes care plan with: ? Your work or school. ? People you live with.  Check your pee (urine) for ketones: ? When you are sick. ? As told by your doctor.  Carry a card or wear jewelry that says you have diabetes. Contact a doctor if:  You have trouble keeping your blood sugar in your target range.  You have low blood sugar often. Get  help right away if:  You still have symptoms after you eat or drink something sugary.  Your blood sugar is at or below 54 mg/dL (3 mmol/L).  You have jerky movements that you cannot control.  You pass out. These symptoms may be an emergency. Do not wait to see if the symptoms will go away. Get medical help right away. Call your local emergency services (911 in the [place] Do not drive yourself to the hospital. Summary  Hypoglycemia happens when the level of sugar (glucose) in your blood is too low.  Low blood sugar can happen to people who have diabetes and people who do not have diabetes. Low blood sugar can happen quickly, and it can be an emergency.  Make sure you know the signs of low blood sugar and know how to treat it.  Always keep a source of sugar (fast-acting carb) with you to treat low blood sugar. This information is not intended to replace advice given to you by your health care provider. Make sure you discuss any questions you have with your health care provider. Document Released: 02/26/2010 Document Revised: 05/26/2018 Document Reviewed: 01/05/2016 Elsevier Interactive Patient Education  2019 [employer].    Insulin Injection Instructions, Using Insulin Pens, Adult A subcutaneous injection is a shot of medicine that is injected into the layer of fat and tissue between skin and muscle. People with type 1 diabetes must take insulin because their bodies do not make it. People with type 2 diabetes may need to take insulin. There are many different types of insulin. The type of insulin that you take may determine how many injections you give yourself and when you need to give the injections. Supplies needed:  Soap and water to wash hands.  Your insulin pen.  A new, unused needle.  Alcohol wipes.  A disposal container that is meant for sharp items (sharps container), such as an empty plastic bottle with a cover. How to choose a site for injection The body absorbs  insulin differently, depending on where the insulin is injected (injection site). It is best to inject insulin into the same body area each time (for example, always in the abdomen), but you should use a different spot in that area for each injection. Do not inject the insulin in the same spot each time. There are five main areas that can be used for injecting. These areas include:  Abdomen. This is the preferred area.  Front of thigh.  Upper, outer side of thigh.  Upper, outer side of arm.  Upper, outer part of buttock. How to use an insulin pen  First, follow the steps for Get ready, then continue with the steps for Inject the insulin. Get ready 1. Wash your hands  with soap and water. If soap and water are not available, use hand sanitizer. 2. Before you give yourself an insulin injection, be sure to test your blood sugar level (blood glucose level) and write down that number. Follow any instructions from your health care provider about what to do if your blood glucose level is higher or lower than your normal range. 3. Check the expiration date and the type of insulin that is in the pen. 4. If you are using CLEAR insulin, check to see that it is clear and free of clumps. 5. If you are using CLOUDY insulin, do not shake the pen to get the injection ready. Instead, get it ready in one of these ways: ? Gently roll the pen between your palms several times. ? Tip the pen up and down several times. 6. Remove the cap from the insulin pen. 7. Use an alcohol wipe to clean the rubber tip of the pen. 8. Remove the protective paper tab from the disposable needle. Do not let the needle touch anything. 9. Screw a new, unused needle onto the pen. 10. Remove the outer plastic needle cover. Do not throw away the outer plastic cover yet. ? If the pen uses a special safety needle, leave the inner needle shield in place. ? If the pen does not use a special safety needle, remove the inner plastic cover  from the needle. 11. Follow the manufacturer's instructions to prime the insulin pen with the volume of insulin needed. Hold the pen with the needle pointing up, and push the button on the opposite end of the pen until a drop of insulin appears at the needle tip. If no insulin appears, repeat this step. 12. Turn the button (dial) to the number of units of insulin that you will be injecting. Inject the insulin 1. Use an alcohol wipe to clean the site where you will be injecting the needle. Let the site air-dry. 2. Hold the pen in the palm of your writing hand like a pencil. 3. If directed by your health care provider, use your other hand to pinch and hold about an inch (2.5 cm) of skin at the injection site. Do not directly touch the cleaned part of the skin. 4. Gently but quickly, use your writing hand to put the needle straight into the skin. The needle should be at a 90-degree angle (perpendicular) to the skin. 5. When the needle is completely inserted into the skin, use your thumb or index finger of your writing hand to push the top button of the pen down all the way to inject the insulin. 6. Let go of the skin that you are pinching. Continue to hold the pen in place with your writing hand. 7. Wait 10 seconds, then pull the needle straight out of the skin. This will allow all of the insulin to go from the pen and needle into your body. 8. Carefully put the larger (outer) plastic cover of the needle back over the needle, then unscrew the capped needle and discard it in a sharps container, such as an empty plastic bottle with a cover. 9. Put the plastic cap back on the insulin pen. How to throw away supplies  Discard all used needles in a puncture-proof sharps disposal container. You can ask your local pharmacy about where you can get this kind of disposal container, or you can use an empty plastic liquid laundry detergent bottle that has a cover.  Follow the disposal regulations for the area where  you live. Do not use any needle more than one time.  Throw away empty disposable pens in the regular trash. Questions to ask your health care provider  How often should I be taking insulin?  How often should I check my blood glucose?  What amount of insulin should I be taking at each time?  What are the side effects?  What should I do if my blood glucose is too high?  What should I do if my blood glucose is too low?  What should I do if I forget to take my insulin?  What number should I call if I have questions? Where to find more information  American Diabetes Association (ADA): www.diabetes.org  American Association of Diabetes Educators (AADE) Patient Resources: https://www.diabeteseducator.org Summary  A subcutaneous injection is a shot of medicine that is injected into the layer of fat and tissue between skin and muscle.  Before you give yourself an insulin injection, be sure to test your blood sugar level (blood glucose level) and write down that number.  Check the expiration date and the type of insulin that is in the pen. The type of insulin that you take may determine how many injections you give yourself and when you need to give the injections.  It is best to inject insulin into the same body area each time (for example, always in the abdomen), but you should use a different spot in that area for each injection. This information is not intended to replace advice given to you by your health care provider. Make sure you discuss any questions you have with your health care provider. Document Released: 01/05/2016 Document Revised: 12/22/2017 Document Reviewed: 01/05/2016 Elsevier Interactive Patient Education  2019 [employer].

## 2019-05-29 NOTE — Discharge Summary (Signed)
Physician Discharge Summary  Alexandria Mejia:[id number] DOB: 09-15-1971 DOA: 05/28/2019  PCP: Alycia Rossetti, MD  Admit date: 05/28/2019 Discharge date: 05/29/2019  Admitted From:  Home  Disposition: Home   Recommendations for Outpatient Follow-up:  1. Follow up with PCP in 1 weeks 2. Please monitor blood sugar 4 times per day. 3. Reduce insulin by 5 units for any blood sugar less than 100.  4. Stop insulin for any blood sugar less than 80 and notify PCP.  5. Increase metformin ER over next 2 weeks to 2 tabs (1000 mg) po BID with meals 6. Please obtain BMP/CBC in 1-2 weeks  Discharge Condition: STABLE   CODE STATUS: FULL    Brief Hospitalization Summary: Please see all hospital notes, images, labs for full details of the hospitalization. HPI: Alexandria Mejia is a 48 y.o. female smoker with chronic back pain, GERD, and no prior known history of diabetes mellitus presented to ER with concerns about and abdominal skin abscess that has been getting worse for the past several days.  She says that she gets these frequently but they normally spontaneously drain and they resolve on their own.  This time the lesion continue to worsen and became very painful.  The patient denies fever and chills nausea vomiting diarrhea.  The patient denies cough.  The patient denies any known COVID-19 exposure.  Patient says that she has a family history of diabetes mellitus but has not been diagnosed.    ED course: The patient was found to have a skin abscess in the lower abdomen that required incision and drainage that was performed successfully in the ED.  The patient was noted to be hyperglycemic with a blood sugar greater than 400 and elevated anion gap and findings of metabolic acidosis.  The patient was started on IV antibiotics for treatment of the superficial skin cellulitis in the abdomen and started on IV fluids and IV insulin for treatment of a mild diabetic ketoacidosis.  The patient was  admitted with a mild diabetic ketoacidosis likely secondary to abscess and cellulitis of the abdominal wall which has been treated with incision and drainage and she has responded well to IV vancomycin.  Her MRSA screen has been negative.  The infection is improving and the cellulitis is much better.  She will be discharged on oral doxycycline for the next 5 days to complete course of therapy.  Her DKA has resolved and her blood sugars are much better controlled.  She has been transitioned off the IV insulin infusion and is now on subcutaneous 70/30 insulin.  She has been started on oral metformin ER 500 mg twice daily with instructions to increase to 2 tabs p.o. twice daily over the next 2 weeks.  The patient has been started on intensive inpatient diabetes education and has been given written information and video information.  The patient says she is familiar with giving insulin due to the fact that both parents were on insulin at some point.  The patient was seen by the inpatient dietitian who has also counseled her.  The patient does not have medical insurance but reports that she will be able to get her medications filled at [facility].  We have prescribed for ReliOn 70/30 insulin flex pen per her request.  She has been given a glucose meter and testing supply prescription with instructions.  The patient had normal renal function and normal TSH.  Her hemoglobin A1c was 10.4% which is indicative of poorly controlled diabetes mellitus with  high risk for acute and chronic complications.  The patient was counseled on the importance of outpatient follow-up.  The patient says that she plans to follow-up with Dr. Buelah Manis in [facility] who she has seen in the past.  I asked the patient to monitor her blood sugar 3-4 times per day and to reduce insulin dose by 5 units for any occurrence of a blood glucose less than 100.  She was instructed to discontinue insulin for any blood glucose less than 80 and to notify primary  care provider.  Patient verbalized understanding.  The patient was also counseled to discontinue all tobacco use.  The patient was offered a nicotine patch while in the hospital.  Discharge Diagnoses:  Principal Problem:   DKA (diabetic ketoacidoses) ([place]) Active Problems:   Obesity, unspecified   Chronic back pain   Tobacco user   Skin abscess   GERD (gastroesophageal reflux disease)   Cellulitis   Leukocytosis   Discharge Instructions: Discharge Instructions    Call MD for:  difficulty breathing, headache or visual disturbances   Complete by: As directed    Call MD for:  persistant dizziness or light-headedness   Complete by: As directed    Call MD for:  persistant nausea and vomiting   Complete by: As directed    Diet - low sodium heart healthy   Complete by: As directed    Increase activity slowly   Complete by: As directed      Allergies as of 05/29/2019      Reactions   Codeine Itching   Sulfa Antibiotics Itching, Swelling      Medication List    TAKE these medications   blood glucose meter kit and supplies Dispense based on patient and insurance preference. Use up to four times daily as directed. (FOR ICD-10 E10.9, E11.9).   doxycycline 100 MG capsule Commonly known as: VIBRAMYCIN Take 1 capsule (100 mg total) by mouth 2 (two) times daily for 5 days.   ibuprofen 800 MG tablet Commonly known as: ADVIL Take 800 mg by mouth every 8 (eight) hours as needed for moderate pain.   insulin NPH-regular Human (70-30) 100 UNIT/ML injection Inject 20 Units into the skin 2 (two) times daily with a meal. OR AS DIRECTED.  REDUCE DOSE BY 5 UNITS IF YOU HAVE ANY BLOOD SUGAR LESS THAN 100.   Insulin Pen Needle 31G X 8 MM Misc 1 Units by Does not apply route as directed.   metFORMIN 500 MG 24 hr tablet Commonly known as: GLUCOPHAGE-XR Take 1 po BID with meals x 1 week, then increase to 2 po BID with meals   omeprazole 20 MG capsule Commonly known as: PRILOSEC Take 20  mg by mouth daily.   Vitamin D 50 MCG (2000 UT) tablet Take 1 tablet (2,000 Units total) by mouth daily.      Follow-up Information    Tuckerman, Modena Nunnery, MD. Schedule an appointment as soon as possible for a visit in 1 week(s).   Specialty: Family Medicine Why: Hospital Follow Up  Contact information: [place] [address] [phone]          Allergies  Allergen Reactions  . Codeine Itching  . Sulfa Antibiotics Itching and Swelling   Allergies as of 05/29/2019      Reactions   Codeine Itching   Sulfa Antibiotics Itching, Swelling      Medication List    TAKE these medications   blood glucose meter kit  and supplies Dispense based on patient and insurance preference. Use up to four times daily as directed. (FOR ICD-10 E10.9, E11.9).   doxycycline 100 MG capsule Commonly known as: VIBRAMYCIN Take 1 capsule (100 mg total) by mouth 2 (two) times daily for 5 days.   ibuprofen 800 MG tablet Commonly known as: ADVIL Take 800 mg by mouth every 8 (eight) hours as needed for moderate pain.   insulin NPH-regular Human (70-30) 100 UNIT/ML injection Inject 20 Units into the skin 2 (two) times daily with a meal. OR AS DIRECTED.  REDUCE DOSE BY 5 UNITS IF YOU HAVE ANY BLOOD SUGAR LESS THAN 100.   Insulin Pen Needle 31G X 8 MM Misc 1 Units by Does not apply route as directed.   metFORMIN 500 MG 24 hr tablet Commonly known as: GLUCOPHAGE-XR Take 1 po BID with meals x 1 week, then increase to 2 po BID with meals   omeprazole 20 MG capsule Commonly known as: PRILOSEC Take 20 mg by mouth daily.   Vitamin D 50 MCG (2000 UT) tablet Take 1 tablet (2,000 Units total) by mouth daily.       Procedures/Studies: No results found.   Subjective: Pt says she is feeling much better today and wants to go home. She feels satisfied with her diabetes education and is willing to follow up with Dr. Buelah Manis in [facility] who she has seen in the past.  She denies any  problems with taking the metformin.  She says she can give her insulin injections.    Discharge Exam: Vitals:   05/29/19 0757 05/29/19 0900  BP:  111/65  Pulse:  66  Resp:  18  Temp: 97.6 F (36.4 C)   SpO2:  96%   Vitals:   05/29/19 0007 05/29/19 0437 05/29/19 0757 05/29/19 0900  BP:    111/65  Pulse:    66  Resp:    18  Temp: 98.7 F (37.1 C) 97.6 F (36.4 C) 97.6 F (36.4 C)   TempSrc:   Oral   SpO2:    96%  Weight:      Height:       General: Pt is alert, awake, not in acute distress Cardiovascular: RRR, S1/S2 +, no rubs, no gallops Respiratory: CTA bilaterally, no wheezing, no rhonchi Abdominal: Soft, NT, ND, bowel sounds + Extremities: no edema, no cyanosis Skin: abscess much improved, draining, surrounding cellulitis is much improved.   The results of significant diagnostics from this hospitalization (including imaging, microbiology, ancillary and laboratory) are listed below for reference.     Microbiology: Recent Results (from the past 240 hour(s))  SARS Coronavirus 2 (CEPHEID - Performed in [place] hospital lab), Hosp Order     Status: None   Collection Time: 05/28/19 11:03 AM   Specimen: Nasopharyngeal Swab  Result Value Ref Range Status   SARS Coronavirus 2 NEGATIVE NEGATIVE Final    Comment: (NOTE) If result is NEGATIVE SARS-CoV-2 target nucleic acids are NOT DETECTED. The SARS-CoV-2 RNA is generally detectable in upper and lower  respiratory specimens during the acute phase of infection. The lowest  concentration of SARS-CoV-2 viral copies this assay can detect is 250  copies / mL. A negative result does not preclude SARS-CoV-2 infection  and should not be used as the sole basis for treatment or other  patient management decisions.  A negative result may occur with  improper specimen collection / handling, submission of specimen other  than nasopharyngeal swab, presence of viral mutation(s) within  the  areas targeted by this assay, and  inadequate number of viral copies  (<250 copies / mL). A negative result must be combined with clinical  observations, patient history, and epidemiological information. If result is POSITIVE SARS-CoV-2 target nucleic acids are DETECTED. The SARS-CoV-2 RNA is generally detectable in upper and lower  respiratory specimens dur ing the acute phase of infection.  Positive  results are indicative of active infection with SARS-CoV-2.  Clinical  correlation with patient history and other diagnostic information is  necessary to determine patient infection status.  Positive results do  not rule out bacterial infection or co-infection with other viruses. If result is PRESUMPTIVE POSTIVE SARS-CoV-2 nucleic acids MAY BE PRESENT.   A presumptive positive result was obtained on the submitted specimen  and confirmed on repeat testing.  While 2019 novel coronavirus  (SARS-CoV-2) nucleic acids may be present in the submitted sample  additional confirmatory testing may be necessary for epidemiological  and / or clinical management purposes  to differentiate between  SARS-CoV-2 and other Sarbecovirus currently known to infect humans.  If clinically indicated additional testing with an alternate test  methodology [phone]) is advised. The SARS-CoV-2 RNA is generally  detectable in upper and lower respiratory sp ecimens during the acute  phase of infection. The expected result is Negative. Fact Sheet for Patients:  StrictlyIdeas.no Fact Sheet for Healthcare Providers: BankingDealers.co.za This test is not yet approved or cleared by the [place] FDA and has been authorized for detection and/or diagnosis of SARS-CoV-2 by FDA under an Emergency Use Authorization (EUA).  This EUA will remain in effect (meaning this test can be used) for the duration of the COVID-19 declaration under Section 564(b)(1) of the Act, 21 U.S.C. section 360bbb-3(b)(1), unless the  authorization is terminated or revoked sooner. Performed at [facility], [address], [place], [place] [address]   MRSA PCR Screening     Status: None   Collection Time: 05/28/19 11:36 AM   Specimen: Nasal Mucosa; Nasopharyngeal  Result Value Ref Range Status   MRSA by PCR NEGATIVE NEGATIVE Final    Comment:        The GeneXpert MRSA Assay (FDA approved for NASAL specimens only), is one component of a comprehensive MRSA colonization surveillance program. It is not intended to diagnose MRSA infection nor to guide or monitor treatment for MRSA infections. Performed at [facility], [address], [place], [place] [address]      Labs: BNP (last 3 results) No results for input(s): BNP in the last 8760 hours. Basic Metabolic Panel: Recent Labs  Lab 05/28/19 0803 05/28/19 1452 05/29/19 0414  NA 133* 138 140  K 4.1 3.4* 3.7  CL 99 105 107  CO2 18* 23 26  GLUCOSE 424* 146* 184*  BUN _0 CREATININE 0.71 0.45 0.48  CALCIUM 9.0 8.2* 8.7*  MG  --   --  1.8   Liver Function Tests: No results for input(s): AST, ALT, ALKPHOS, BILITOT, PROT, ALBUMIN in the last 168 hours. No results for input(s): LIPASE, AMYLASE in the last 168 hours. No results for input(s): AMMONIA in the last 168 hours. CBC: Recent Labs  Lab 05/28/19 0803  WBC 11.7*  HGB 14.3  HCT 42.9  MCV 95.5  PLT 177   Cardiac Enzymes: No results for input(s): CKTOTAL, CKMB, CKMBINDEX, TROPONINI in the last 168 hours. BNP: Invalid input(s): POCBNP CBG: Recent Labs  Lab 05/28/19 1643 05/28/19 1746 05/28/19 2113 05/29/19 0251 05/29/19 0758  GLUCAP 147* 161*  206* 192* 202*   D-Dimer No results for input(s): DDIMER in the last 72 hours. Hgb A1c Recent Labs    05/28/19 1059  HGBA1C 10.4*   Lipid Profile No results for input(s): CHOL, HDL, LDLCALC, TRIG, CHOLHDL, LDLDIRECT in the last 72 hours. Thyroid function studies Recent Labs    05/28/19 1059  TSH 3.176   Anemia work up No results for  input(s): VITAMINB12, FOLATE, FERRITIN, TIBC, IRON, RETICCTPCT in the last 72 hours. Urinalysis    Component Value Date/Time   COLORURINE YELLOW 05/28/2019 1047   APPEARANCEUR HAZY (A) 05/28/2019 1047   LABSPEC 1.035 (H) 05/28/2019 1047   PHURINE 6.0 05/28/2019 1047   GLUCOSEU >=500 (A) 05/28/2019 1047   HGBUR NEGATIVE 05/28/2019 [place] 05/28/2019 1047   KETONESUR NEGATIVE 05/28/2019 1047   PROTEINUR NEGATIVE 05/28/2019 1047   UROBILINOGEN 0.2 09/06/2013 1950   NITRITE NEGATIVE 05/28/2019 1047   LEUKOCYTESUR NEGATIVE 05/28/2019 1047   Sepsis Labs Invalid input(s): PROCALCITONIN,  WBC,  LACTICIDVEN Microbiology Recent Results (from the past 240 hour(s))  SARS Coronavirus 2 (CEPHEID - Performed in [place] hospital lab), Hosp Order     Status: None   Collection Time: 05/28/19 11:03 AM   Specimen: Nasopharyngeal Swab  Result Value Ref Range Status   SARS Coronavirus 2 NEGATIVE NEGATIVE Final    Comment: (NOTE) If result is NEGATIVE SARS-CoV-2 target nucleic acids are NOT DETECTED. The SARS-CoV-2 RNA is generally detectable in upper and lower  respiratory specimens during the acute phase of infection. The lowest  concentration of SARS-CoV-2 viral copies this assay can detect is 250  copies / mL. A negative result does not preclude SARS-CoV-2 infection  and should not be used as the sole basis for treatment or other  patient management decisions.  A negative result may occur with  improper specimen collection / handling, submission of specimen other  than nasopharyngeal swab, presence of viral mutation(s) within the  areas targeted by this assay, and inadequate number of viral copies  (<250 copies / mL). A negative result must be combined with clinical  observations, patient history, and epidemiological information. If result is POSITIVE SARS-CoV-2 target nucleic acids are DETECTED. The SARS-CoV-2 RNA is generally detectable in upper and lower   respiratory specimens dur ing the acute phase of infection.  Positive  results are indicative of active infection with SARS-CoV-2.  Clinical  correlation with patient history and other diagnostic information is  necessary to determine patient infection status.  Positive results do  not rule out bacterial infection or co-infection with other viruses. If result is PRESUMPTIVE POSTIVE SARS-CoV-2 nucleic acids MAY BE PRESENT.   A presumptive positive result was obtained on the submitted specimen  and confirmed on repeat testing.  While 2019 novel coronavirus  (SARS-CoV-2) nucleic acids may be present in the submitted sample  additional confirmatory testing may be necessary for epidemiological  and / or clinical management purposes  to differentiate between  SARS-CoV-2 and other Sarbecovirus currently known to infect humans.  If clinically indicated additional testing with an alternate test  methodology [phone]) is advised. The SARS-CoV-2 RNA is generally  detectable in upper and lower respiratory sp ecimens during the acute  phase of infection. The expected result is Negative. Fact Sheet for Patients:  StrictlyIdeas.no Fact Sheet for Healthcare Providers: BankingDealers.co.za This test is not yet approved or cleared by the [place] FDA and has been authorized for detection and/or diagnosis of SARS-CoV-2 by FDA under an Emergency Use Authorization (  EUA).  This EUA will remain in effect (meaning this test can be used) for the duration of the COVID-19 declaration under Section 564(b)(1) of the Act, 21 U.S.C. section 360bbb-3(b)(1), unless the authorization is terminated or revoked sooner. Performed at [facility], [address] [place], [place] [address]   MRSA PCR Screening     Status: None   Collection Time: 05/28/19 11:36 AM   Specimen: Nasal Mucosa; Nasopharyngeal  Result Value Ref Range Status   MRSA by PCR NEGATIVE NEGATIVE  Final    Comment:        The GeneXpert MRSA Assay (FDA approved for NASAL specimens only), is one component of a comprehensive MRSA colonization surveillance program. It is not intended to diagnose MRSA infection nor to guide or monitor treatment for MRSA infections. Performed at [facility], [address], [place], [place] [address]    Time coordinating discharge:   SIGNED:  Irwin Brakeman, MD  [facility] 05/29/2019, 9:37 AM How to contact the [facility] Attending or Consulting provider [place] or covering provider during after hours [place], for this patient?  1. Check the care team in [facility] and look for a) attending/consulting [facility] provider listed and b) the [facility] team listed 2. Log into [email or web address] and use [place]'s universal password to access. If you do not have the password, please contact the hospital operator. 3. Locate the [facility] provider you are looking for under [facility] and page to a number that you can be directly reached. 4. If you still have difficulty reaching the provider, please page the [facility] (Director on Call) for the Hospitalists listed on amion for assistance.

## 2019-05-29 NOTE — Progress Notes (Signed)
Dr. Olevia Bowens notified of periods of sinus bradycardia while sleeping, low of 46. B/P 110/68. MAP 82.

## 2019-05-30 ENCOUNTER — Emergency Department (HOSPITAL_COMMUNITY)
Admission: EM | Admit: 2019-05-30 | Discharge: 2019-05-30 | Disposition: A | Discharge status: Home / Self Care | Payer: Self-pay | Attending: Emergency Medicine | Admitting: Emergency Medicine

## 2019-05-30 ENCOUNTER — Other Ambulatory Visit: Payer: Self-pay

## 2019-05-30 ENCOUNTER — Encounter (HOSPITAL_COMMUNITY): Payer: Self-pay | Admitting: Emergency Medicine

## 2019-05-30 DIAGNOSIS — R350 Frequency of micturition: Secondary | ICD-10-CM

## 2019-05-30 DIAGNOSIS — N390 Urinary tract infection, site not specified: Secondary | ICD-10-CM | POA: Insufficient documentation

## 2019-05-30 DIAGNOSIS — Z79899 Other long term (current) drug therapy: Secondary | ICD-10-CM | POA: Insufficient documentation

## 2019-05-30 DIAGNOSIS — E1165 Type 2 diabetes mellitus with hyperglycemia: Secondary | ICD-10-CM | POA: Insufficient documentation

## 2019-05-30 DIAGNOSIS — Z794 Long term (current) use of insulin: Secondary | ICD-10-CM | POA: Insufficient documentation

## 2019-05-30 HISTORY — DX: Type 2 diabetes mellitus without complications: E11.9

## 2019-05-30 LAB — CBC WITH DIFFERENTIAL/PLATELET
Abs Immature Granulocytes: 0.01 10*3/uL (ref 0.00–0.07)
Basophils Absolute: 0 10*3/uL (ref 0.0–0.1)
Basophils Relative: 0 %
Eosinophils Absolute: 0.2 10*3/uL (ref 0.0–0.5)
Eosinophils Relative: 3 %
HCT: 43.9 % (ref 36.0–46.0)
Hemoglobin: 14.4 g/dL (ref 12.0–15.0)
Immature Granulocytes: 0 %
Lymphocytes Relative: 35 %
Lymphs Abs: 2.6 10*3/uL (ref 0.7–4.0)
MCH: 31 pg (ref 26.0–34.0)
MCHC: 32.8 g/dL (ref 30.0–36.0)
MCV: 94.6 fL (ref 80.0–100.0)
Monocytes Absolute: 0.5 10*3/uL (ref 0.1–1.0)
Monocytes Relative: 7 %
Neutro Abs: 4.1 10*3/uL (ref 1.7–7.7)
Neutrophils Relative %: 55 %
Platelets: 257 10*3/uL (ref 150–400)
RBC: 4.64 MIL/uL (ref 3.87–5.11)
RDW: 11.9 % (ref 11.5–15.5)
WBC: 7.4 10*3/uL (ref 4.0–10.5)
nRBC: 0 % (ref 0.0–0.2)

## 2019-05-30 LAB — BASIC METABOLIC PANEL
Anion gap: 10 (ref 5–15)
BUN: 16 mg/dL (ref 6–20)
CO2: 24 mmol/L (ref 22–32)
Calcium: 9.3 mg/dL (ref 8.9–10.3)
Chloride: 103 mmol/L (ref 98–111)
Creatinine, Ser: 0.48 mg/dL (ref 0.44–1.00)
GFR calc Af Amer: >60 mL/min (ref >60)
GFR calc non Af Amer: >60 mL/min (ref >60)
Glucose, Bld: 186 mg/dL — ABNORMAL HIGH (ref 70–99)
Potassium: 3.7 mmol/L (ref 3.5–5.1)
Sodium: 137 mmol/L (ref 135–145)

## 2019-05-30 LAB — URINALYSIS, ROUTINE W REFLEX MICROSCOPIC
Bilirubin Urine: NEGATIVE
Glucose, UA: NEGATIVE mg/dL
Hgb urine dipstick: NEGATIVE
Ketones, ur: NEGATIVE mg/dL
Nitrite: NEGATIVE
Protein, ur: NEGATIVE mg/dL
Specific Gravity, Urine: 1.008 (ref 1.005–1.030)
pH: 5 (ref 5.0–8.0)

## 2019-05-30 LAB — CBG MONITORING, ED: Glucose-Capillary: 181 mg/dL — ABNORMAL HIGH (ref 70–99)

## 2019-05-30 MED ORDER — SODIUM CHLORIDE 0.9 % IV BOLUS
1000.0000 mL | Freq: Once | INTRAVENOUS | Status: AC
  Administered 2019-05-30: 1000 mL via INTRAVENOUS

## 2019-05-30 MED ORDER — CEPHALEXIN 500 MG PO CAPS: 500.0000 mg | ORAL_CAPSULE | Freq: Four times a day (QID) | ORAL | 0 refills | Status: DC

## 2019-05-30 MED ORDER — DIPHENHYDRAMINE HCL 50 MG/ML IJ SOLN
12.5000 mg | Freq: Once | INTRAMUSCULAR | Status: AC
  Administered 2019-05-30: 18:00:00 12.5 mg via INTRAVENOUS
  Filled 2019-05-30: qty 1

## 2019-05-30 MED ORDER — PROCHLORPERAZINE EDISYLATE 10 MG/2ML IJ SOLN
10.0000 mg | Freq: Once | INTRAMUSCULAR | Status: AC
  Administered 2019-05-30: 18:00:00 10 mg via INTRAVENOUS
  Filled 2019-05-30: qty 2

## 2019-05-30 NOTE — ED Provider Notes (Signed)
[facility] EMERGENCY DEPARTMENT Provider Note   CSN: [id number] Arrival date & time: 05/30/19  1547    History   Chief Complaint Chief Complaint  Patient presents with   Hyperglycemia    HPI Alexandria Mejia is a 48 y.o. female with a hx of chronic back pain, GERD, migraine headaches, insulin-dependent diabetes presents to the Emergency Department complaining of gradual, persistent, progressively worsening fruity taste in her mouth with associated generalized and throbbing headache along with urinary frequency.  Patient reports she was recently discharged from the hospital after new diagnosis of diabetes and DKA.  Patient reports that she was able to bring home and insulin pen from the hospital but was unable to fill her insulin prescription yesterday after discharge due to the pharmacy being closed.  He reports taking 20 units of insulin last night but did not have enough for this morning.  She developed the fruity taste in her mouth and headache this morning, prompting her return.  No specific aggravating or alleviating factors.  She reports her abscess is healing well.  She denies neck pain, neck stiffness, fever, chills, chest pain, shortness of breath, abdominal pain, nausea, vomiting, diarrhea, weakness, dizziness, syncope.  Record review shows that patient was evaluated on 05/28/2019 for skin abscess.  She was found to have new onset diabetes and DKA at that time.  She was treated with IV vancomycin and discharged on doxycycline.  DKA was resolved and patient is now taking subcutaneous 70/30 insulin.  Additionally she was started on oral metformin ER 500 mg twice daily with instructions to increase to 2 tabs p.o. twice daily over the next 2 weeks.     The history is provided by the patient and medical records. No language interpreter was used.    Past Medical History:  Diagnosis Date   Back pain    Complication of anesthesia    patient states "with last surgery I was hard to  wake up".   Diabetes mellitus without complication ([place])    DKA (diabetic ketoacidoses) ([facility])    GERD (gastroesophageal reflux disease)    Hypotension    Migraine     Patient Active Problem List   Diagnosis Date Noted   Abdominal wall abscess 05/29/2019   DKA (diabetic ketoacidoses) ([place]) 05/28/2019   Skin abscess 05/28/2019   GERD (gastroesophageal reflux disease)    Cellulitis    Leukocytosis    H/O repair of right rotator cuff 10/24/2014   Partial tear of rotator cuff 03/01/2014   Shoulder injury 02/14/2014   Rotator cuff tear, right 02/14/2014   Routine general medical examination at a health care facility 09/17/2012   Shoulder pain 09/17/2012   Stress 09/17/2012   Candidiasis, intertrigo 09/17/2012   Obesity, unspecified 08/04/2012   Chronic back pain 08/04/2012   Nausea 08/04/2012   Tobacco user 08/04/2012    Past Surgical History:  Procedure Laterality Date   APPENDECTOMY     BACK SURGERY     lumbar-herniated disc   CHOLECYSTECTOMY     FLEXOR TENOTOMY Right 10/12/2014   Procedure: BICEPS TENOTOMY;  Surgeon: Carole Civil, MD;  Location: [facility];  Service: Orthopedics;  Laterality: Right;   NECK SURGERY     decompression of 7 disc   SHOULDER ARTHROSCOPY WITH ROTATOR CUFF REPAIR Right 05/27/2014   Procedure: SHOULDER ARTHROSCOPY WITH ROTATOR CUFF REPAIR, subscalpularis repair, open supraspinatus repair;  Surgeon: Carole Civil, MD;  Location: [facility];  Service: Orthopedics;  Laterality: Right;   SHOULDER OPEN ROTATOR  CUFF REPAIR Right 10/12/2014   Procedure: OPEN ROTATOR CUFF REPAIR SHOULDER;  Surgeon: Carole Civil, MD;  Location: [facility];  Service: Orthopedics;  Laterality: Right;   ULNAR NERVE TRANSPOSITION Right 11/13/2015   Procedure: RIGHT ULNAR NERVE TRANSPOSITION;  Surgeon: Milly Jakob, MD;  Location: [place];  Service: Orthopedics;  Laterality: Right;     OB History   No obstetric history on  file.      Home Medications    Prior to Admission medications   Medication Sig Start Date End Date Taking? Authorizing Provider  Cholecalciferol (VITAMIN D) 50 MCG (2000 UT) tablet Take 1 tablet (2,000 Units total) by mouth daily. 05/29/19  Yes Johnson, Clanford L, MD  doxycycline (VIBRAMYCIN) 100 MG capsule Take 1 capsule (100 mg total) by mouth 2 (two) times daily for 5 days. 05/29/19 06/03/19 Yes Johnson, Clanford L, MD  ibuprofen (ADVIL) 800 MG tablet Take 800 mg by mouth every 8 (eight) hours as needed for moderate pain.   Yes [provider]  insulin NPH-regular Human (70-30) 100 UNIT/ML injection Inject 20 Units into the skin 2 (two) times daily with a meal. OR AS DIRECTED.  REDUCE DOSE BY 5 UNITS IF YOU HAVE ANY BLOOD SUGAR LESS THAN 100. 05/29/19  Yes Johnson, Clanford L, MD  metFORMIN (GLUCOPHAGE-XR) 500 MG 24 hr tablet Take 1 po BID with meals x 1 week, then increase to 2 po BID with meals 05/29/19  Yes Johnson, Clanford L, MD  omeprazole (PRILOSEC) 20 MG capsule Take 20 mg by mouth daily.   Yes [provider]  blood glucose meter kit and supplies Dispense based on patient and insurance preference. Use up to four times daily as directed. (FOR ICD-10 E10.9, E11.9). 05/29/19   Johnson, Clanford L, MD  cephALEXin (KEFLEX) 500 MG capsule Take 1 capsule (500 mg total) by mouth 4 (four) times daily. 05/30/19   Euna Armon, Jarrett Soho, PA-C  Insulin Pen Needle 31G X 8 MM MISC 1 Units by Does not apply route as directed. 05/29/19   Murlean Iba, MD    Family History Family History  Problem Relation Age of Onset   COPD Mother    Arthritis Mother    Diabetes Father    Heart disease Father    Hypertension Brother    Diabetes Paternal Grandmother    Diabetes Maternal Grandmother    Dementia Paternal Grandfather     Social History Social History   Tobacco Use   Smoking status: Current Some Day Smoker    Packs/day: 0.50    Years: 30.00    Pack years: 15.00     Types: Cigarettes   Smokeless tobacco: Never Used  Substance Use Topics   Alcohol use: Yes    Comment: occ   Drug use: No     Allergies   Codeine and Sulfa antibiotics   Review of Systems Review of Systems  Constitutional: Negative for appetite change, diaphoresis, fatigue, fever and unexpected weight change.  HENT: Negative for mouth sores.   Eyes: Negative for visual disturbance.  Respiratory: Negative for cough, chest tightness, shortness of breath and wheezing.   Cardiovascular: Negative for chest pain.  Gastrointestinal: Negative for abdominal pain, constipation, diarrhea, nausea and vomiting.  Endocrine: Positive for polydipsia and polyuria. Negative for polyphagia.  Genitourinary: Negative for dysuria, frequency, hematuria and urgency.  Musculoskeletal: Negative for back pain and neck stiffness.  Skin: Positive for wound. Negative for rash.  Allergic/Immunologic: Negative for immunocompromised state.  Neurological: Positive for headaches.  Negative for syncope and light-headedness.  Hematological: Does not bruise/bleed easily.  Psychiatric/Behavioral: Negative for sleep disturbance. The patient is not nervous/anxious.      Physical Exam Updated Vital Signs BP 116/66    Pulse 62 Comment: Reassessed.    Temp 97.9 F (36.6 C) (Oral)    Resp 16    LMP 05/09/2019    SpO2 96%   Physical Exam Vitals signs and nursing note reviewed.  Constitutional:      General: She is not in acute distress.    Appearance: She is not diaphoretic.  HENT:     Head: Normocephalic.     Mouth/Throat:     Mouth: Mucous membranes are dry.  Eyes:     General: No scleral icterus.    Conjunctiva/sclera: Conjunctivae normal.  Neck:     Musculoskeletal: Normal range of motion.  Cardiovascular:     Rate and Rhythm: Normal rate and regular rhythm.     Pulses: Normal pulses.          Radial pulses are 2+ on the right side and 2+ on the left side.  Pulmonary:     Effort: No tachypnea,  accessory muscle usage, prolonged expiration, respiratory distress or retractions.     Breath sounds: No stridor.     Comments: Equal chest rise. No increased work of breathing. Abdominal:     General: There is no distension.     Palpations: Abdomen is soft.     Tenderness: There is no abdominal tenderness. There is no guarding or rebound.     Comments: Area of mild induration and mild erythema.  Site of I&D is open and draining.  Packing has been removed  Musculoskeletal:     Comments: Moves all extremities equally and without difficulty.  Skin:    General: Skin is warm and dry.     Capillary Refill: Capillary refill takes less than 2 seconds.  Neurological:     Mental Status: She is alert.     GCS: GCS eye subscore is 4. GCS verbal subscore is 5. GCS motor subscore is 6.     Comments: Speech is clear and goal oriented.  Psychiatric:        Mood and Affect: Mood normal.      ED Treatments / Results  Labs (all labs ordered are listed, but only abnormal results are displayed) Labs Reviewed  BASIC METABOLIC PANEL - Abnormal; Notable for the following components:      Result Value   Glucose, Bld 186 (*)    All other components within normal limits  URINALYSIS, ROUTINE W REFLEX MICROSCOPIC - Abnormal; Notable for the following components:   APPearance CLOUDY (*)    Leukocytes,Ua LARGE (*)    Bacteria, UA RARE (*)    All other components within normal limits  CBG MONITORING, ED - Abnormal; Notable for the following components:   Glucose-Capillary 181 (*)    All other components within normal limits  URINE CULTURE  CBC WITH DIFFERENTIAL/PLATELET     Procedures Procedures (including critical care time)  Medications Ordered in ED Medications  sodium chloride 0.9 % bolus 1,000 mL (0 mLs Intravenous Stopped 05/30/19 1815)  sodium chloride 0.9 % bolus 1,000 mL (0 mLs Intravenous Stopped 05/30/19 2003)  prochlorperazine (COMPAZINE) injection 10 mg (10 mg Intravenous Given  05/30/19 1816)  diphenhydrAMINE (BENADRYL) injection 12.5 mg (12.5 mg Intravenous Given 05/30/19 1816)     Initial Impression / Assessment and Plan / ED Course  I have reviewed the triage  vital signs and the nursing notes.  Pertinent labs & imaging results that were available during my care of the patient were reviewed by me and considered in my medical decision making (see chart for details).        Patient presents for concerns of hyperglycemia.  Glucose 181 with normal anion gap of 10.  No leukocytosis.  Patient is afebrile without tachycardia or hypotension.  Site of abscess appears to be healing well.  Urinalysis is without ketones.  No evidence of DKA.  Patient does have large leukocytes with white blood cells and some bacteria.  Patient is reporting urinary frequency and some discomfort with urination.  Will treat for urinary tract infection.  Urine culture pending. She is well-appearing and has tolerated p.o. here in the department without difficulty.  Patient also reports that her headache has resolved.  Long discussion with patient about her insulin.  She does report that she has plenty of her 7030 at home and metformin.  After record review.  It appears that these are the only 2 medications she is supposed to be taking for her diabetes at this time.  I have asked her to confirm this with her primary care physician but to continue taking these medications as prescribed.  Also discussed reasons to return immediately to the emergency department.  Patient states understanding and is in agreement with the plan.    Final Clinical Impressions(s) / ED Diagnoses   Final diagnoses:  Urinary frequency  Urinary tract infection without hematuria, site unspecified    ED Discharge Orders         Ordered    cephALEXin (KEFLEX) 500 MG capsule  4 times daily     05/30/19 2015           Mychaela Lennartz, Gwenlyn Perking 05/30/19 2022    Virgel Manifold, MD 05/31/19 1614

## 2019-05-30 NOTE — ED Triage Notes (Addendum)
Patient discharged from here yesterday after being admitted on 6/12 for new onset diabetes with DKA. Patient states unable to take full dose of insulin due to inability to get prescription filled in a timely manner. Patient states was told to come back to hospital if she had "juicy fruit flavor in mouth." Patient reports tasting sweet flavor in mouth, jittery, extreme thirst, and has severe headache. Patient states when she checked blood sugar at home it was 227. Patient reports using insulin pen at home, 20 units prior to coming to ED

## 2019-05-30 NOTE — Discharge Instructions (Addendum)
1. Medications: Keflex, usual home medications 2. Treatment: rest, drink plenty of fluids,  3. Follow Up: Please followup with your primary doctor in 2 days for discussion of your diagnoses and further evaluation after today's visit; if you do not have a primary care doctor use the resource guide provided to find one; Please return to the ER for return of "fruity taste" in mouth, fevers, vomiting, abd pain, weakness, high blood sugars or other concerns.

## 2019-06-01 LAB — URINE CULTURE: Culture: NO GROWTH

## 2019-06-17 ENCOUNTER — Ambulatory Visit (INDEPENDENT_AMBULATORY_CARE_PROVIDER_SITE_OTHER): Payer: Self-pay | Admitting: Family Medicine

## 2019-06-17 ENCOUNTER — Other Ambulatory Visit: Payer: Self-pay

## 2019-06-17 ENCOUNTER — Encounter: Payer: Self-pay | Admitting: Family Medicine

## 2019-06-17 VITALS — BP 120/70 | HR 74 | Temp 97.7°F | Resp 12 | Ht 69.0 in | Wt 213.1 lb

## 2019-06-17 DIAGNOSIS — Z6831 Body mass index (BMI) 31.0-31.9, adult: Secondary | ICD-10-CM

## 2019-06-17 DIAGNOSIS — E119 Type 2 diabetes mellitus without complications: Secondary | ICD-10-CM

## 2019-06-17 DIAGNOSIS — K219 Gastro-esophageal reflux disease without esophagitis: Secondary | ICD-10-CM

## 2019-06-17 DIAGNOSIS — Z720 Tobacco use: Secondary | ICD-10-CM

## 2019-06-17 DIAGNOSIS — E6609 Other obesity due to excess calories: Secondary | ICD-10-CM

## 2019-06-17 DIAGNOSIS — L02211 Cutaneous abscess of abdominal wall: Secondary | ICD-10-CM

## 2019-06-17 MED ORDER — INSULIN NPH ISOPHANE & REGULAR (70-30) 100 UNIT/ML ~~LOC~~ SUSP: 20.0000 [IU] | Freq: Two times a day (BID) | SUBCUTANEOUS | 1 refills | Status: DC

## 2019-06-17 NOTE — Progress Notes (Signed)
Subjective:     Patient ID: Alexandria Mejia, female   DOB: 02-Mar-1971, 48 y.o.   MRN: [id number]  Alexandria Mejia presents for New Patient (Initial Visit) (establish care)  Alexandria Mejia is a 48 year old female patient who presents to [facility] to establish care today secondary to finding out that she has diabetes.  Was a previous patient of Dr. Buelah Manis.  Has not really been seen by her doctor in a year or so.  Has a significant history of diabetes, GERD, obesity, tobacco abuse, shoulder injuries and surgeries.  Recently was in the hospital secondary to a abdominal skin abscess that had been getting worse over the last several days.  She went to the hospital on June 12 of 2020.  She had reported that occasionally she will get the skin abscesses/boils and they would usually resolve on their own after spontaneously draining.  She reported that that time the lesion was worse and was becoming very painful.  She denied having any fevers or chills or any other signs or symptoms of infection systemically.  Denied having a cough and denied having recent exposure to COVID-19. Prior to this she had no history of diabetes.  Though she has a significant family history of diabetes she was found to have an elevated blood sugar in the 400s with an elevated anion gap.  Skin abscess required a incision and drainage and was performed in the emergency room successfully without issue.  She was started on IV antibiotics for the treatment of superficial skin cellulitis and abdomen.  And started on IV fluids and IV insulin to treat mild diabetic ketoacidosis.  She was started on 70/30 insulin.  And started on oral metformin ER 500 mg twice daily with the instructions to increase to 2 tabs twice daily over the next 2 weeks.  She reports that she has been taking her blood sugar and her medications as directed.  Today she says that she has had a little less blood sugar of 96-110's.  Reports that she has been  changing her diet.  And has been reducing her sugar and alcohol intake.  Reports that she does drink up to 12 alcoholic drinks on Friday evenings.  But she is a question whether or not this is appropriate for her to drinking more.  Her father was a diabetic and she is very familiar with the signs and symptoms of diabetes.  Socially she is a smoker about half a pack over 4 days.  Roughly 4 cigarettes a day.  Would like to quit last year she was smoking to a pack a day and a half.  Does report alcohol use as stated above.  Denies having any illegal or prescription drug use that are not hers.  Reports sexually active female partners only.  Reports not being exposed to any sexually transmitted diseases that she knows of.  Currently living with her mother and helping take care of her since her father passed last summer in 2019 secondary to stopping dialysis.  Overall she reports that her diet is much better than it was.  She enjoys to eat meat and does have extra sugar at times.  But is trying to adjusted now that she knows that she has diabetes.  She reports she does not really drink a lot of caffeine.  And has been trying to get a or more cups of water daily since finding out about the diabetes.   She does not report that at times she  has felt palpitations or fluttering sensations in her chest.  Had it worked up many years ago she does not really recall ever being diagnosed with atrial fib.  But she reports that since she has been monitoring her blood sugar this is gotten much better.  Health maintenance wise she is not sure if she is ever had pneumonia vaccine, foot exam, ophthalmology exam, urine albumin tested.  As she did not have a history of diabetes.  Has not had a Pap smear and she does not remember how longer when.  Will make a decision as to whether or not she wants to have Pap smears done [facility] versus going to a GYN.  She is unsure if she has been tested for hepatitis C or HIV  as well.  Overall today she is feeling much better she has improved energy she is about to go on a trip to the beach with her friends.  Also make sure that it is okay for her to drink some alcohol.  And she also know more about her diet for diabetes.  Is very interested in making sure that she gets this under control if she wants her dad died from not taking care of himself with this.  Today patient denies signs and symptoms of COVID 19 infection including fever, chills, cough, shortness of breath, and headache.    Past Medical, Surgical, Social History, Allergies, and Medications have been Reviewed.  Past Medical History:  Diagnosis Date   Back pain    Complication of anesthesia    patient states "with last surgery I was hard to wake up".   Diabetes mellitus without complication ([facility])    DKA (diabetic ketoacidoses) (HCC)    GERD (gastroesophageal reflux disease)    Hypotension    Migraine    Past Surgical History:  Procedure Laterality Date   APPENDECTOMY     BACK SURGERY     lumbar-herniated disc   CHOLECYSTECTOMY     FLEXOR TENOTOMY Right 10/12/2014   Procedure: BICEPS TENOTOMY;  Surgeon: Carole Civil, MD;  Location: [facility];  Service: Orthopedics;  Laterality: Right;   NECK SURGERY     decompression of 7 disc   SHOULDER ARTHROSCOPY WITH ROTATOR CUFF REPAIR Right 05/27/2014   Procedure: SHOULDER ARTHROSCOPY WITH ROTATOR CUFF REPAIR, subscalpularis repair, open supraspinatus repair;  Surgeon: Carole Civil, MD;  Location: [facility];  Service: Orthopedics;  Laterality: Right;   SHOULDER OPEN ROTATOR CUFF REPAIR Right 10/12/2014   Procedure: OPEN ROTATOR CUFF REPAIR SHOULDER;  Surgeon: Carole Civil, MD;  Location: [facility];  Service: Orthopedics;  Laterality: Right;   ULNAR NERVE TRANSPOSITION Right 11/13/2015   Procedure: RIGHT ULNAR NERVE TRANSPOSITION;  Surgeon: Milly Jakob, MD;  Location: [facility];  Service: Orthopedics;   Laterality: Right;   Social History   Socioeconomic History   Marital status: Single    Spouse name: Not on file   Number of children: Not on file   Years of education: Not on file   Highest education level: Not on file  Occupational History   Not on file  Social Needs   Financial resource strain: Not very hard   Food insecurity    Worry: Sometimes true    Inability: Sometimes true   Transportation needs    Medical: No    Non-medical: No  Tobacco Use   Smoking status: Current Some Day Smoker    Packs/day: 0.50    Years: 30.00  Pack years: 15.00    Types: Cigarettes   Smokeless tobacco: Never Used  Substance and Sexual Activity   Alcohol use: Yes    Comment: occ   Drug use: No   Sexual activity: Yes    Birth control/protection: None    Comment: female partners only  Lifestyle   Physical activity    Days per week: Patient refused    Minutes per session: Patient refused   Stress: To some extent  Relationships   Social connections    Talks on phone: Patient refused    Gets together: Patient refused    Attends religious service: Patient refused    Active member of club or organization: Patient refused    Attends meetings of clubs or organizations: Patient refused    Relationship status: Patient refused   Intimate partner violence    Fear of current or ex partner: No    Emotionally abused: No    Physically abused: No    Forced sexual activity: No  Other Topics Concern   Not on file  Social History Narrative   Not on file    Outpatient Encounter Medications as of 06/17/2019  Medication Sig   blood glucose meter kit and supplies Dispense based on patient and insurance preference. Use up to four times daily as directed. (FOR ICD-10 E10.9, E11.9).   Cholecalciferol (VITAMIN D) 50 MCG (2000 UT) tablet Take 1 tablet (2,000 Units total) by mouth daily.   ibuprofen (ADVIL) 800 MG tablet Take 800 mg by mouth every 8 (eight) hours as needed for  moderate pain.   insulin NPH-regular Human (70-30) 100 UNIT/ML injection Inject 20 Units into the skin 2 (two) times daily with a meal. OR AS DIRECTED.  REDUCE DOSE BY 5 UNITS IF YOU HAVE ANY BLOOD SUGAR LESS THAN 100.   Insulin Pen Needle 31G X 8 MM MISC 1 Units by Does not apply route as directed.   metFORMIN (GLUCOPHAGE) 500 MG tablet Take 1,000 mg by mouth 2 (two) times daily with a meal.   omeprazole (PRILOSEC) 20 MG capsule Take 20 mg by mouth daily.   [DISCONTINUED] cephALEXin (KEFLEX) 500 MG capsule Take 1 capsule (500 mg total) by mouth 4 (four) times daily. (Patient not taking: Reported on 06/17/2019)   [DISCONTINUED] metFORMIN (GLUCOPHAGE-XR) 500 MG 24 hr tablet Take 1 po BID with meals x 1 week, then increase to 2 po BID with meals (Patient not taking: Reported on 06/17/2019)   No facility-administered encounter medications on file as of 06/17/2019.    Allergies  Allergen Reactions   Codeine Itching   Sulfa Antibiotics Itching and Swelling    Review of Systems  Constitutional: Negative for activity change, appetite change, chills and fever.  HENT: Negative.   Eyes: Negative for visual disturbance.  Respiratory: Negative for cough and shortness of breath.   Cardiovascular: Negative for chest pain, palpitations and leg swelling.  Gastrointestinal: Negative.   Endocrine: Negative for polydipsia, polyphagia and polyuria.  Genitourinary: Negative.   Musculoskeletal: Negative.   Skin: Negative.   Neurological: Negative for dizziness and headaches.  Hematological: Negative.   Psychiatric/Behavioral: Negative.   All other systems reviewed and are negative.      Objective:     BP 120/70    Pulse 74    Temp 97.7 F (36.5 C) (Temporal)    Resp 12    Ht 5' 9"  (1.753 m)    Wt 213 lb 1.9 oz (96.7 kg)    SpO2 97%  BMI 31.47 kg/m   Physical Exam Vitals signs and nursing note reviewed.  Constitutional:      Appearance: Normal appearance. She is well-developed and  well-groomed. She is obese.  HENT:     Head: Normocephalic and atraumatic.     Right Ear: External ear normal.     Left Ear: External ear normal.     Nose: Nose normal.  Eyes:     General:        Right eye: No discharge.     Conjunctiva/sclera: Conjunctivae normal.  Neck:     Musculoskeletal: Normal range of motion and neck supple.  Cardiovascular:     Rate and Rhythm: Normal rate and regular rhythm.     Pulses: Normal pulses.     Heart sounds: Normal heart sounds.  Pulmonary:     Effort: Pulmonary effort is normal.     Breath sounds: Normal breath sounds.  Musculoskeletal: Normal range of motion.  Skin:    General: Skin is warm.     Capillary Refill: Capillary refill takes less than 2 seconds.     Comments: Noted tattooing on the left arm.  Neurological:     Mental Status: She is alert and oriented to person, place, and time.  Psychiatric:        Mood and Affect: Mood normal.        Behavior: Behavior normal. Behavior is cooperative.        Thought Content: Thought content normal.        Judgment: Judgment normal.        Assessment and Plan       1. Type 2 diabetes mellitus without complication, unspecified whether long term insulin use (HCC) Uncontrolled, recent A1c was 10.4.  All other labs were within range.  We will be looking to assess A1c in the future. Provided with diabetic education today.  Patient is receptive and eager to make sure that she can get her diabetes under and would like to maybe possibly in the future stop using the insulin if she can do this with diet and lifestyle changes. - Hemoglobin A1c  2. Class 1 obesity due to excess calories with serious comorbidity and body mass index (BMI) of 31.0 to 31.9 in adult Uncontrolled, reports that she is doing much better and that was 1 time over 300 pounds.  She has been trying to lose weight.  But when the gym is closed she has not been able to exercise.  He has diabetes she will be making sure that she takes  better care of herself.  Is already tried to initiate diet changes.  Educated again on proper diet for both diabetes and weight loss.  In addition to making sure that she gets some form of exercise even if it is just walking 30 minutes on most days of the week.  3. Nicotine abuse Would like to quit in the near future but is not ready at this time.  Would like to focus on diabetes getting it under control. Asked about quitting: confirms they are currently smokes cigarettes Advise to quit smoking: Educated about QUITTING to reduce the risk of cancer, cardio and cerebrovascular disease. Assess willingness: Unwilling to quit at this time, but is working on cutting back. Assist with counseling and pharmacotherapy: Counseled for 5 minutes and literature provided. Arrange for follow up: not quitting follow up in 3 months and continue to offer help.    4. Gastroesophageal reflux disease, esophagitis presence not specified Controlled, continue to take  omeprazole.  Educated on the impact of alcohol and foods diet causing excessive heartburn.  5. Cutaneous abscess of abdominal wall Completed all antibiotics prescribed.  Abscess has healed since being in the emergency room.  Not having any other issues or other skin abscesses at this time.   Follow Up: 3 months, labs 1 week prior to appointment  Perlie Mayo, DNP, AGNP-BC [place], [place], [place] [address] Office Hours: Mon-Thurs 8 am-5 pm; Fri 8 am-12 pm Office Phone:  [phone]  Office Fax: [phone]

## 2019-06-17 NOTE — Patient Instructions (Addendum)
   [place]  Thank you for coming into the office today. I appreciate the opportunity to provide you with the care for your health and wellness. Today we discussed:  Diabetes  FOLLOW UP: 3 months  Labs 1 week prior to appt  CONTINUE THE REDUCTION OF SMOKING :)  Continue diabetes control with blood sugar checks, diet control, exercise as you can.  Please continue to practice social distancing to keep you, your family, and our community safe.  If you must go out, please wear a Mask and practice good handwashing.  [place] YOUR HANDS WELL AND FREQUENTLY. AVOID TOUCHING YOUR FACE, UNLESS YOUR HANDS ARE FRESHLY WASHED.  GET FRESH AIR DAILY. STAY HYDRATED WITH WATER.   It was a pleasure to see you and I look forward to continuing to work together on your health and well-being. Please do not hesitate to call the office if you need care or have questions about your care.  Have a wonderful day and weekend.  With Gratitude,  Cherly Beach, DNP, AGNP-BC

## 2019-06-29 ENCOUNTER — Encounter: Payer: Self-pay | Admitting: Family Medicine

## 2019-06-29 ENCOUNTER — Other Ambulatory Visit: Payer: Self-pay

## 2019-06-29 ENCOUNTER — Ambulatory Visit (HOSPITAL_COMMUNITY)
Admission: RE | Admit: 2019-06-29 | Discharge: 2019-06-29 | Disposition: A | Discharge status: Home / Self Care | Payer: Self-pay | Source: Ambulatory Visit | Attending: Family Medicine | Admitting: Family Medicine

## 2019-06-29 ENCOUNTER — Ambulatory Visit (INDEPENDENT_AMBULATORY_CARE_PROVIDER_SITE_OTHER): Payer: Self-pay | Admitting: Family Medicine

## 2019-06-29 VITALS — BP 132/80 | HR 79 | Temp 98.6°F | Ht 69.0 in | Wt 218.1 lb

## 2019-06-29 DIAGNOSIS — E119 Type 2 diabetes mellitus without complications: Secondary | ICD-10-CM

## 2019-06-29 DIAGNOSIS — M7989 Other specified soft tissue disorders: Secondary | ICD-10-CM | POA: Insufficient documentation

## 2019-06-29 DIAGNOSIS — I83812 Varicose veins of left lower extremities with pain: Secondary | ICD-10-CM | POA: Insufficient documentation

## 2019-06-29 MED ORDER — METFORMIN HCL 1000 MG PO TABS: 1000.0000 mg | ORAL_TABLET | Freq: Two times a day (BID) | ORAL | 1 refills | Status: DC

## 2019-06-29 NOTE — Patient Instructions (Signed)
Thank you for coming into the office today. I appreciate the opportunity to provide you with the care for your health and wellness. Today we discussed: leg pain and swelling  [facility] and get doppler of leg.  Once we have this a plan can be made on treatments, if needed. I will update you once I can.  I have also referred you to a Vein Specialist. They can help identify causes might not be seen on doppler.  Keep leg elevated when sitting.  Please continue to practice social distancing to keep you, your family, and our community safe.  If you must go out, please wear a Mask and practice good handwashing.   WASH YOUR HANDS WELL AND FREQUENTLY. AVOID TOUCHING YOUR FACE, UNLESS YOUR HANDS ARE FRESHLY WASHED.   GET FRESH AIR DAILY. STAY HYDRATED WITH WATER.   It was a pleasure to see you and I look forward to continuing to work together on your health and well-being. Please do not hesitate to call the office if you need care or have questions about your care.  Have a wonderful day and week.  With Gratitude,  Tereasa CoopHannah Reise Gladney, DNP, AGNP-BC   Varicose Veins Varicose veins are veins that have become enlarged, bulged, and twisted. They most often appear in the legs. What are the causes? This condition is caused by damage to the valves in the vein. These valves help blood return to your heart. When they are damaged and they stop working properly, blood may flow backward and back up in the veins near the skin, causing the veins to get larger and appear twisted. The condition can result from any issue that causes blood to back up, like pregnancy, prolonged standing, or obesity. What increases the risk? This condition is more likely to develop in people who are:  On their feet a lot.  Pregnant.  Overweight. What are the signs or symptoms? Symptoms of this condition include:  Bulging, twisted, and bluish veins.  A feeling of heaviness. This may be worse at the end  of the day.  Leg pain. This may be worse at the end of the day.  Swelling in the leg.  Changes in skin color over the veins. How is this diagnosed? This condition may be diagnosed based on your symptoms, a physical exam, and an ultrasound test. How is this treated? Treatment for this condition may involve:  Avoiding sitting or standing in one position for long periods of time.  Wearing compression stockings. These stockings help to prevent blood clots and reduce swelling in the legs.  Raising (elevating) the legs when resting.  Losing weight.  Exercising regularly. If you have persistent symptoms or want to improve the way your varicose veins look, you may choose to have a procedure to close the varicose veins off or to remove them. Treatments to close off the veins include:  Sclerotherapy. In this treatment, a solution is injected into a vein to close it off.  Laser treatment. In this treatment, the vein is heated with a laser to close it off.  Radiofrequency vein ablation. In this treatment, an electrical current produced by radio waves is used to close off the vein. Treatments to remove the veins include:  Phlebectomy. In this treatment, the veins are removed through small incisions made over the veins.  Vein ligation and stripping. In this treatment, incisions are made over the veins. The veins are then removed after being tied (ligated) with stitches (sutures). Follow  these instructions at home: Activity  Walk as much as possible. Walking increases blood flow. This helps blood return to the heart and takes pressure off your veins. It also increases your cardiovascular strength.  Follow your health care provider's instructions about exercising.  Do not stand or sit in one position for a long period of time.  Do not sit with your legs crossed.  Rest with your legs raised during the day. General instructions   Follow any diet instructions given to you by your health  care provider.  Wear compression stockings as directed by your health care provider. Do not wear other kinds of tight clothing around your legs, pelvis, or waist.  Elevate your legs at night to above the level of your heart.  If you get a cut in the skin over the varicose vein and the vein bleeds: ? Lie down with your leg raised. ? Apply firm pressure to the cut with a clean cloth until the bleeding stops. ? Place a bandage (dressing) on the cut. Contact a health care provider if:  The skin around your varicose veins starts to break down.  You have pain, redness, tenderness, or hard swelling over a vein.  You are uncomfortable because of pain.  You get a cut in the skin over a varicose vein and it will not stop bleeding. Summary  Varicose veins are veins that have become enlarged, bulged, and twisted. They most often appear in the legs.  This condition is caused by damage to the valves in the vein. These valves help blood return to your heart.  Treatment for this condition includes frequent movements, wearing compression stockings, losing weight, and exercising regularly. In some cases, procedures are done to close off or remove the veins.  Treatment for this condition may include wearing compression stockings, elevating the legs, losing weight, and engaging in regular activity. In some cases, procedures are done to close off or remove the veins. This information is not intended to replace advice given to you by your health care provider. Make sure you discuss any questions you have with your health care provider. Document Released: 09/11/2005 Document Revised: 01/28/2019 Document Reviewed: 12/25/2016 Elsevier Patient Education  2020 [employer].

## 2019-06-29 NOTE — Progress Notes (Signed)
Subjective:     Patient ID: Alexandria Mejia, female   DOB: 12-May-1971, 48 y.o.   MRN: [id number]  Alexandria Mejia presents for Knee Pain (left) and Foot Pain (left)  Alexandria Mejia is a 48 year old patient of mine.  Who presents back today secondary to having new onset knee pain on the left side along with leg swelling.  Significant history includes previous back pain, diabetes without complication, acid reflux, migraine.  Was just seen by me back on July 2 for establishment of care for DKA that was found during an emergency room visit. At that time there were no irregularities or issues with the left leg.  Today she reports that after having a [place] last week to the beach she developed leg pain as she got out of the car.  Reported that she needed to walk it off and question whether or not if it was cramping.  Is pretty active and moving around.  Denied having any other periods where she has been sitting for long periods of time outside the drive. Location is behind the left knee a few inches superior traveling down the knee around to the medial side of the leg.  Noted to have some swelling, color change-redness, reported "firecracker hot", bumpy areas as well.  Denies having any sensation change.  Only reported ongoing pain and tenderness.  Denies having any previous varicose veins or leg swelling.  Denies having any previous swelling or concurrent swelling of the right leg.  Additionally she ended up getting a cut by seashell on the top of her left foot.  This has a scab.  No noted changes in the foot.  No discoloration noted.  Though she did note that her mother thought that her leg was turning a little bit purplish black at one time.  Has not really tried anything for this except for trying to stay off of it, or elevated and did take some NSAIDs but this did not provide any relief.  She denies having any chest pain, headaches, vision changes, dizziness, changes in her energy level.   Reports overall feeling just as she did prior to going to the beach outside of her leg discomfort and pain as described above.  Today patient denies signs and symptoms of COVID 19 infection including fever, chills, cough, shortness of breath, and headache.  Past Medical, Surgical, Social History, Allergies, and Medications have been Reviewed.   Past Medical History:  Diagnosis Date  . Back pain   . Complication of anesthesia    patient states "with last surgery I was hard to wake up".  . Diabetes mellitus without complication ([place])   . DKA (diabetic ketoacidoses) ([place])   . GERD (gastroesophageal reflux disease)   . Hypotension   . Migraine    Past Surgical History:  Procedure Laterality Date  . APPENDECTOMY    . BACK SURGERY     lumbar-herniated disc  . CHOLECYSTECTOMY    . FLEXOR TENOTOMY Right 10/12/2014   Procedure: BICEPS TENOTOMY;  Surgeon: Carole Civil, MD;  Location: [facility];  Service: Orthopedics;  Laterality: Right;  . NECK SURGERY     decompression of 7 disc  . SHOULDER ARTHROSCOPY WITH ROTATOR CUFF REPAIR Right 05/27/2014   Procedure: SHOULDER ARTHROSCOPY WITH ROTATOR CUFF REPAIR, subscalpularis repair, open supraspinatus repair;  Surgeon: Carole Civil, MD;  Location: [facility];  Service: Orthopedics;  Laterality: Right;  . SHOULDER OPEN ROTATOR CUFF REPAIR Right 10/12/2014   Procedure: OPEN  ROTATOR CUFF REPAIR SHOULDER;  Surgeon: Carole Civil, MD;  Location: [facility];  Service: Orthopedics;  Laterality: Right;  . ULNAR NERVE TRANSPOSITION Right 11/13/2015   Procedure: RIGHT ULNAR NERVE TRANSPOSITION;  Surgeon: Milly Jakob, MD;  Location: [facility];  Service: Orthopedics;  Laterality: Right;   Social History   Socioeconomic History  . Marital status: Single    Spouse name: Not on file  . Number of children: Not on file  . Years of education: Not on file  . Highest education level: High school graduate  Occupational History  . Not  on file  Social Needs  . Financial resource strain: Not very hard  . Food insecurity    Worry: Sometimes true    Inability: Sometimes true  . Transportation needs    Medical: No    Non-medical: No  Tobacco Use  . Smoking status: Current Some Day Smoker    Packs/day: 0.50    Years: 30.00    Pack years: 15.00    Types: Cigarettes  . Smokeless tobacco: Never Used  Substance and Sexual Activity  . Alcohol use: Yes    Comment: occ  . Drug use: No  . Sexual activity: Yes    Birth control/protection: None    Comment: female partners only  Lifestyle  . Physical activity    Days per week: 0 days    Minutes per session: 0 min  . Stress: To some extent  Relationships  . Social connections    Talks on phone: More than three times a week    Gets together: More than three times a week    Attends religious service: Never    Active member of club or organization: No    Attends meetings of clubs or organizations: Never    Relationship status: Never married  . Intimate partner violence    Fear of current or ex partner: No    Emotionally abused: No    Physically abused: No    Forced sexual activity: No  Other Topics Concern  . Not on file  Social History Narrative   Living with mom   No pets      Enjoy: fish, karaoke-turn the page by bob seger, hanging with friends      Diet: overall good, drinks, extra sugar, chips, meat   Caffeine: not really drinking it   Water: 8 cups plus daily      Wears seatbelt   Wears Suncreen   Smoke detectors carbon monoxide   Does not use phone while driving             Outpatient Encounter Medications as of 06/29/2019  Medication Sig  . blood glucose meter kit and supplies Dispense based on patient and insurance preference. Use up to four times daily as directed. (FOR ICD-10 E10.9, E11.9).  Marland Kitchen Cholecalciferol (VITAMIN D) 50 MCG (2000 UT) tablet Take 1 tablet (2,000 Units total) by mouth daily.  Marland Kitchen ibuprofen (ADVIL) 800 MG tablet Take 800 mg by  mouth every 8 (eight) hours as needed for moderate pain.  Marland Kitchen insulin NPH-regular Human (70-30) 100 UNIT/ML injection Inject 20 Units into the skin 2 (two) times daily with a meal. OR AS DIRECTED.  REDUCE DOSE BY 5 UNITS IF YOU HAVE ANY BLOOD SUGAR LESS THAN 100.  . Insulin Pen Needle 31G X 8 MM MISC 1 Units by Does not apply route as directed.  . metFORMIN (GLUCOPHAGE) 500 MG tablet Take 1,000 mg by mouth 2 (two) times  daily with a meal.  . omeprazole (PRILOSEC) 20 MG capsule Take 20 mg by mouth daily.   No facility-administered encounter medications on file as of 06/29/2019.    Allergies  Allergen Reactions  . Codeine Itching  . Sulfa Antibiotics Itching and Swelling    Review of Systems  Constitutional: Negative for activity change, appetite change, chills and fever.  HENT: Negative.   Eyes: Negative.   Respiratory: Negative.   Cardiovascular: Positive for leg swelling.       Left side leg swelling, see HPI  Gastrointestinal: Negative.   Endocrine: Negative.   Genitourinary: Negative.   Musculoskeletal: Negative.   Skin: Positive for color change. Negative for pallor, rash and wound.  Allergic/Immunologic: Negative.   Neurological: Negative.   Hematological: Negative.   Psychiatric/Behavioral: Negative.   All other systems reviewed and are negative.      Objective:     BP 132/80 (BP Location: Right Arm, Patient Position: Sitting, Cuff Size: Normal)   Pulse 79   Temp 98.6 F (37 C) (Oral)   Ht _0  (1.753 m)   Wt 218 lb 1.3 oz (98.9 kg)   SpO2 98%   BMI 32.20 kg/m   Physical Exam Vitals signs and nursing note reviewed.  Constitutional:      Appearance: Normal appearance. She is obese.  HENT:     Head: Normocephalic and atraumatic.     Right Ear: External ear normal.     Left Ear: External ear normal.     Nose: Nose normal.  Eyes:     General:        Right eye: No discharge.        Left eye: No discharge.     Conjunctiva/sclera: Conjunctivae normal.  Neck:      Musculoskeletal: Normal range of motion and neck supple.  Cardiovascular:     Rate and Rhythm: Normal rate and regular rhythm.     Pulses: Normal pulses.          Dorsalis pedis pulses are 2+ on the left side.       Posterior tibial pulses are 2+ on the left side.     Heart sounds: Normal heart sounds.     Comments: Noted new onset of varicose vein in left leg/knee Swelling, non pitting.   Pulmonary:     Effort: Pulmonary effort is normal.     Breath sounds: Normal breath sounds.  Abdominal:     General: Abdomen is flat. Bowel sounds are normal.     Palpations: Abdomen is soft.  Musculoskeletal:     Left lower leg: Edema present.  Skin:    General: Skin is warm and dry.     Findings: Erythema present.          Comments: New varicose vein of leg leg/knee Not noted at last visit Swelling of left leg, warmth and redness present  Neurological:     General: No focal deficit present.     Mental Status: She is alert and oriented to person, place, and time.  Psychiatric:        Mood and Affect: Mood normal.        Behavior: Behavior normal.        Thought Content: Thought content normal.        Judgment: Judgment normal.        Assessment and Plan        1. Varicose veins of left lower extremity with pain Aside from DVT differential, highly suspect varicose  veins being the culprit of this leg swelling. Will r/o DVT and refer to Vascular sx.  Appreciate collaboration in her care.  - Ambulatory referral to Vascular Surgery - US Venous Img Lower Unilateral Left  2. Left leg swelling New onset, will rule out clot/DVT and refer to Vascular. Appreciate collaboration in her care.   - Ambulatory referral to Vascular Surgery - US Venous Img Lower Unilateral Left  3. Type 2 diabetes mellitus without complication, unspecified whether long term insulin use ([place]) Refilled needed and provided.  - metFORMIN (GLUCOPHAGE) 1000 MG tablet; Take 1 tablet (1,000 mg total) by mouth  2 (two) times daily with a meal.  Dispense: 90 tablet; Refill: 1  Follow Up: 1 month    Alexandria Mayo, DNP, AGNP-BC [place], [address] [place], [place] [address] Office Hours: Mon-Thurs 8 am-5 pm; Fri 8 am-12 pm Office Phone:  [phone]  Office Fax: [phone]

## 2019-07-01 ENCOUNTER — Telehealth: Payer: Self-pay | Admitting: *Deleted

## 2019-07-01 NOTE — Telephone Encounter (Signed)
Please give recommendation

## 2019-07-01 NOTE — Telephone Encounter (Signed)
Called Dr. Donnetta Hutching office made an appt for 07-08-19 at [facility] in [place]. Called the pt and gave her Jarrett Soho recommendations as well as the appt information.

## 2019-07-01 NOTE — Telephone Encounter (Signed)
Pt called said she was seen by Jarrett Soho and she went and had the scan done on her leg but it is still swollen and hurting really bad. She cannot hardly walk on it. Wanted to know what to do about the pain as she has taken tylenol and ibuprofen and been resting it and nothing is helping.

## 2019-07-01 NOTE — Telephone Encounter (Signed)
Can you call this pt please

## 2019-07-02 ENCOUNTER — Other Ambulatory Visit: Payer: Self-pay

## 2019-07-02 DIAGNOSIS — I82409 Acute embolism and thrombosis of unspecified deep veins of unspecified lower extremity: Secondary | ICD-10-CM

## 2019-07-08 ENCOUNTER — Ambulatory Visit (HOSPITAL_COMMUNITY)
Admission: RE | Admit: 2019-07-08 | Discharge: 2019-07-08 | Disposition: A | Discharge status: Home / Self Care | Payer: Self-pay | Source: Ambulatory Visit | Attending: Vascular Surgery | Admitting: Vascular Surgery

## 2019-07-08 ENCOUNTER — Ambulatory Visit (INDEPENDENT_AMBULATORY_CARE_PROVIDER_SITE_OTHER): Payer: Self-pay | Admitting: Vascular Surgery

## 2019-07-08 ENCOUNTER — Encounter: Payer: Self-pay | Admitting: Vascular Surgery

## 2019-07-08 ENCOUNTER — Other Ambulatory Visit: Payer: Self-pay

## 2019-07-08 VITALS — BP 100/69 | HR 65 | Temp 98.0°F | Resp 18 | Ht 68.0 in | Wt 205.2 lb

## 2019-07-08 DIAGNOSIS — I82409 Acute embolism and thrombosis of unspecified deep veins of unspecified lower extremity: Secondary | ICD-10-CM | POA: Insufficient documentation

## 2019-07-08 DIAGNOSIS — M7989 Other specified soft tissue disorders: Secondary | ICD-10-CM
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
## 2019-07-13 ENCOUNTER — Other Ambulatory Visit: Payer: Self-pay | Admitting: Vascular Surgery

## 2019-07-13 DIAGNOSIS — M7989 Other specified soft tissue disorders: Secondary | ICD-10-CM

## 2019-07-13 DIAGNOSIS — I82409 Acute embolism and thrombosis of unspecified deep veins of unspecified lower extremity: Secondary | ICD-10-CM

## 2019-07-20 ENCOUNTER — Encounter: Payer: Self-pay | Admitting: Family Medicine

## 2019-07-20 ENCOUNTER — Other Ambulatory Visit: Payer: Self-pay

## 2019-07-20 ENCOUNTER — Ambulatory Visit (INDEPENDENT_AMBULATORY_CARE_PROVIDER_SITE_OTHER): Payer: Self-pay | Admitting: Family Medicine

## 2019-07-20 VITALS — BP 110/64 | HR 80 | Temp 97.9°F | Resp 12 | Ht 69.0 in | Wt 217.0 lb

## 2019-07-20 DIAGNOSIS — M62838 Other muscle spasm: Secondary | ICD-10-CM

## 2019-07-20 DIAGNOSIS — N3000 Acute cystitis without hematuria: Secondary | ICD-10-CM

## 2019-07-20 DIAGNOSIS — R300 Dysuria: Secondary | ICD-10-CM

## 2019-07-20 DIAGNOSIS — E119 Type 2 diabetes mellitus without complications: Secondary | ICD-10-CM | POA: Insufficient documentation

## 2019-07-20 DIAGNOSIS — M79605 Pain in left leg: Secondary | ICD-10-CM

## 2019-07-20 LAB — POCT URINALYSIS DIP (CLINITEK)
Blood, UA: NEGATIVE
Glucose, UA: 100 mg/dL — AB
Nitrite, UA: NEGATIVE
POC PROTEIN,UA: NEGATIVE
Spec Grav, UA: >=1.03 — AB (ref 1.010–1.025)
Urobilinogen, UA: 0.2 E.U./dL
pH, UA: 5.5 (ref 5.0–8.0)

## 2019-07-20 MED ORDER — TRAMADOL HCL 50 MG PO TABS: 50.0000 mg | ORAL_TABLET | Freq: Four times a day (QID) | ORAL | 0 refills | Status: AC | PRN

## 2019-07-20 MED ORDER — NITROFURANTOIN MONOHYD MACRO 100 MG PO CAPS: 100.0000 mg | ORAL_CAPSULE | Freq: Two times a day (BID) | ORAL | 0 refills | Status: AC

## 2019-07-20 MED ORDER — CYCLOBENZAPRINE HCL 7.5 MG PO TABS: 7.5000 mg | ORAL_TABLET | Freq: Three times a day (TID) | ORAL | 0 refills | Status: DC | PRN

## 2019-07-20 NOTE — Patient Instructions (Addendum)
    Thank you for coming into the office today. I appreciate the opportunity to provide you with the care for your health and wellness. Today we discussed: leg pain  Follow Up: 09/17/2019 (in system already)  UA in office today-some signs of infection, will send out for a culture and cover you with an  antibiotic to help with symptoms you are experience. Please try to use the restroom every  Few hours. Increase water intake to drink 4-8 oz every hour.  Also as talked about reduce your insulin to 15 units twice daily. Let me know your readings over the last week. (Can just call or message them).  I have sent in pain medication to help with muscle spasms and overall pain with leg while you wait for your CT scan. If these do not work, I would contact the Vascular Dr and ask what he might recommend.   Please get the compression socks as soon as you can.  Please continue to practice social distancing to keep you, your family, and our community safe.  If you must go out, please wear a Mask and practice good handwashing.  [place] YOUR HANDS WELL AND FREQUENTLY. AVOID TOUCHING YOUR FACE, UNLESS YOUR HANDS ARE FRESHLY WASHED.  GET FRESH AIR DAILY. STAY HYDRATED WITH WATER.   It was a pleasure to see you and I look forward to continuing to work together on your health and well-being. Please do not hesitate to call the office if you need care or have questions about your care.  Have a wonderful day and week. With Gratitude, Cherly Beach, DNP, AGNP-BC

## 2019-07-20 NOTE — Progress Notes (Signed)
Subjective:     Patient ID: Alexandria Mejia, female   DOB: Jun 27, 1971, 48 y.o.   MRN: [id number]  Alexandria Mejia presents for Leg Pain (bilateral)  Vascular specialist was consulted by me in July due to new onset varicose veins. Acute pain secondary to DVT of unspecified vein. Having CT scan on 8/12. Reports not being able to afford the compression socks ordered for her. Having pain after 1 hour of standing. Ibuprofen is not helping. Feels like someone is squeezing her bones. Denies trauma or injury to site.  DM: Reports sugars going into 70's, not lower, but Alexandria Mejia feels sick when they do drop and this is making her feel bad.It is happening both day and night. Reports taking 70/30 (20 unit)s BID and her metformin 1046m BID. AM fasting is between 96-115. Denies chills, shakiness, N/V at this time. Reports drinking water but that Alexandria Mejia is also no feeling the need to pee like Alexandria Mejia should. Is having burning and pain with urination. Thinks Alexandria Mejia might have a stone, cause on right flank side Alexandria Mejia has pain. No fever. This just started a day or two ago.Denies changes in her habits. Reports Alexandria Mejia just think Alexandria Mejia is holding her urine too long.   Today patient denies signs and symptoms of COVID 19 infection including fever, chills, cough, shortness of breath, and headache.  Past Medical, Surgical, Social History, Allergies, and Medications have been Reviewed.   Past Medical History:  Diagnosis Date  . Back pain   . Complication of anesthesia    patient states "with last surgery I was hard to wake up".  . Diabetes mellitus without complication ([facility]   . DKA (diabetic ketoacidoses) ([facility]   . GERD (gastroesophageal reflux disease)   . Hypotension   . Migraine    Past Surgical History:  Procedure Laterality Date  . APPENDECTOMY    . BACK SURGERY     lumbar-herniated disc  . CHOLECYSTECTOMY    . FLEXOR TENOTOMY Right 10/12/2014   Procedure: BICEPS TENOTOMY;  Surgeon: SCarole Civil MD;   Location: [facility];  Service: Orthopedics;  Laterality: Right;  . NECK SURGERY     decompression of 7 disc  . SHOULDER ARTHROSCOPY WITH ROTATOR CUFF REPAIR Right 05/27/2014   Procedure: SHOULDER ARTHROSCOPY WITH ROTATOR CUFF REPAIR, subscalpularis repair, open supraspinatus repair;  Surgeon: SCarole Civil MD;  Location: [facility];  Service: Orthopedics;  Laterality: Right;  . SHOULDER OPEN ROTATOR CUFF REPAIR Right 10/12/2014   Procedure: OPEN ROTATOR CUFF REPAIR SHOULDER;  Surgeon: SCarole Civil MD;  Location: [facility];  Service: Orthopedics;  Laterality: Right;  . ULNAR NERVE TRANSPOSITION Right 11/13/2015   Procedure: RIGHT ULNAR NERVE TRANSPOSITION;  Surgeon: DMilly Jakob MD;  [facility]  Service: Orthopedics;  Laterality: Right;   Social History   Socioeconomic History  . Marital status: Single    Spouse name: Not on file  . Number of children: Not on file  . Years of education: Not on file  . Highest education level: High school graduate  Occupational History  . Not on file  Social Needs  . Financial resource strain: Not very hard  . Food insecurity    Worry: Sometimes true    Inability: Sometimes true  . Transportation needs    Medical: No    Non-medical: No  Tobacco Use  . Smoking status: Current Some Day Smoker    Packs/day: 0.50    Years: 30.00    Pack years:  15.00    Types: Cigarettes  . Smokeless tobacco: Never Used  Substance and Sexual Activity  . Alcohol use: Yes    Comment: occ  . Drug use: No  . Sexual activity: Yes    Birth control/protection: None    Comment: female partners only  Lifestyle  . Physical activity    Days per week: 0 days    Minutes per session: 0 min  . Stress: To some extent  Relationships  . Social connections    Talks on phone: More than three times a week    Gets together: More than three times a week    Attends religious service: Never    Active member of club or organization: No    Attends  meetings of clubs or organizations: Never    Relationship status: Never married  . Intimate partner violence    Fear of current or ex partner: No    Emotionally abused: No    Physically abused: No    Forced sexual activity: No  Other Topics Concern  . Not on file  Social History Narrative   Living with mom   No pets      Enjoy: fish, karaoke-turn the page by bob seger, hanging with friends      Diet: overall good, drinks, extra sugar, chips, meat   Caffeine: not really drinking it   Water: 8 cups plus daily      Wears seatbelt   Wears Suncreen   Smoke detectors carbon monoxide   Does not use phone while driving             Outpatient Encounter Medications as of 07/20/2019  Medication Sig  . blood glucose meter kit and supplies Dispense based on patient and insurance preference. Use up to four times daily as directed. (FOR ICD-10 E10.9, E11.9).  Marland Kitchen Cholecalciferol (VITAMIN D) 50 MCG (2000 UT) tablet Take 1 tablet (2,000 Units total) by mouth daily.  Marland Kitchen ibuprofen (ADVIL) 800 MG tablet Take 800 mg by mouth every 8 (eight) hours as needed for moderate pain.  Marland Kitchen insulin NPH-regular Human (70-30) 100 UNIT/ML injection Inject 20 Units into the skin 2 (two) times daily with a meal. OR AS DIRECTED.  REDUCE DOSE BY 5 UNITS IF YOU HAVE ANY BLOOD SUGAR LESS THAN 100.  . Insulin Pen Needle 31G X 8 MM MISC 1 Units by Does not apply route as directed.  . metFORMIN (GLUCOPHAGE) 1000 MG tablet Take 1 tablet (1,000 mg total) by mouth 2 (two) times daily with a meal.  . omeprazole (PRILOSEC) 20 MG capsule Take 20 mg by mouth daily.   No facility-administered encounter medications on file as of 07/20/2019.    Allergies  Allergen Reactions  . Codeine Itching  . Sulfa Antibiotics Itching and Swelling    Review of Systems  Constitutional: Negative for chills and fever.  HENT: Negative.   Eyes: Negative.   Respiratory: Negative.   Cardiovascular: Negative.   Gastrointestinal: Negative.    Endocrine: Negative.   Genitourinary: Positive for dysuria and flank pain. Negative for frequency, hematuria, pelvic pain and urgency.  Skin: Negative.   Allergic/Immunologic: Negative.   Neurological: Negative.   Hematological: Negative.   Psychiatric/Behavioral: Negative.   All other systems reviewed and are negative.      Objective:     BP 110/64   Pulse 80   Temp 97.9 F (36.6 C) (Oral)   Resp 12   Ht _0  (1.753 m)   Wt 217 lb (98.4  kg)   SpO2 97%   BMI 32.05 kg/m   Physical Exam Vitals signs and nursing note reviewed.  Constitutional:      Appearance: Normal appearance. Alexandria Mejia is well-developed and well-groomed. Alexandria Mejia is obese.  HENT:     Head: Normocephalic and atraumatic.     Right Ear: External ear normal.     Left Ear: External ear normal.     Nose: Nose normal.     Mouth/Throat:     Mouth: Mucous membranes are moist.     Pharynx: Oropharynx is clear.  Eyes:     General:        Right eye: No discharge.        Left eye: No discharge.     Conjunctiva/sclera: Conjunctivae normal.  Neck:     Musculoskeletal: Normal range of motion and neck supple.  Cardiovascular:     Rate and Rhythm: Normal rate and regular rhythm.     Pulses: Normal pulses.     Heart sounds: Normal heart sounds.  Pulmonary:     Effort: Pulmonary effort is normal.     Breath sounds: Normal breath sounds.  Abdominal:     General: Bowel sounds are normal.     Tenderness: There is right CVA tenderness. There is no left CVA tenderness.  Musculoskeletal: Normal range of motion.  Skin:    General: Skin is warm.  Neurological:     General: No focal deficit present.     Mental Status: Alexandria Mejia is alert and oriented to person, place, and time.  Psychiatric:        Attention and Perception: Attention normal.        Mood and Affect: Mood normal.        Speech: Speech normal.        Behavior: Behavior normal. Behavior is cooperative.        Thought Content: Thought content normal.        Cognition  and Memory: Cognition normal.        Judgment: Judgment normal.        Assessment and Plan        1. Type 2 diabetes mellitus without complication, unspecified whether long term insulin use (HCC) Will adjust 70/30 to 15 units BID with a follow up on CBG in 1 week to see if that helps with lower numbers  And symptoms Alexandria Mejia is feeling.   ANNTIONETTE MADKINS is encouraged to check blood sugar daily as directed. Continue current medications. Educated on importance of maintain a well balanced diabetic friendly diet.  Alexandria Mejia is reminded the importance of maintaining  good blood sugars,  taking medications as directed, daily foot care, annual eye exams. Additionally educated about keeping good control over blood pressure and cholesterol as well.  2. Muscle spasm of left lower extremity Reports increase in leg pain over the last few weeks. Having a had time standing for longer than an hour. Has not been able to afford the compression socks prescribed by [facility]. Encouraged Alexandria Mejia get these as soon as possible. Will try muscle relaxer to see if that can help.  Reviewed side effects, risks and benefits of medication.    - cyclobenzaprine (FEXMID) 7.5 MG tablet; Take 1 tablet (7.5 mg total) by mouth 3 (three) times daily as needed for muscle spasms.  Dispense: 30 tablet; Refill: 0  3. Pain of left lower extremity See above, will cover pain with muscle relaxer and ultram to help until CT scan is checked Again encouraged the compression  being the best pain reliever. Advised to contact [facility] if pain persist.  Reviewed side effects, risks and benefits of medication.   Patient acknowledged agreement and understanding of the plan.   - cyclobenzaprine (FEXMID) 7.5 MG tablet; Take 1 tablet (7.5 mg total) by mouth 3 (three) times daily as needed for muscle spasms.  Dispense: 30 tablet; Refill: 0 - traMADol (ULTRAM) 50 MG tablet; Take 1 tablet (50 mg total) by mouth every 6 (six) hours as needed for up to 5 days.   Dispense: 20 tablet; Refill: 0  4. Increased frequency of urination UA in office + for WBC, KET, and gluc and bil. Will send out. Covering with Macrobid as Alexandria Mejia is symptomatic and tenderness on Right CVA  - POCT URINALYSIS DIP (CLINITEK) - Urine Culture - nitrofurantoin, macrocrystal-monohydrate, (MACROBID) 100 MG capsule; Take 1 capsule (100 mg total) by mouth 2 (two) times daily for 5 days.  Dispense: 10 capsule; Refill: 0  5. Acute cystitis without hematuria UA in office + for WBC, KET, and gluc and bil. Will send out. Covering with Macrobid as Alexandria Mejia is symptomatic and tenderness on Right CVA  - nitrofurantoin, macrocrystal-monohydrate, (MACROBID) 100 MG capsule; Take 1 capsule (100 mg total) by mouth 2 (two) times daily for 5 days.  Dispense: 10 capsule; Refill: 0   Follow Up: 09/17/2019  Perlie Mayo, DNP, AGNP-BC [address], [address], [place] [address] Office Hours: Mon-Thurs 8 am-5 pm; Fri 8 am-12 pm Office Phone:  [phone]  Office Fax: [phone]

## 2019-07-21 ENCOUNTER — Other Ambulatory Visit (HOSPITAL_COMMUNITY)
Admission: RE | Admit: 2019-07-21 | Discharge: 2019-07-21 | Disposition: A | Discharge status: Home / Self Care | Payer: Self-pay | Source: Ambulatory Visit | Attending: Family Medicine | Admitting: Family Medicine

## 2019-07-21 DIAGNOSIS — R350 Frequency of micturition: Secondary | ICD-10-CM | POA: Insufficient documentation

## 2019-07-22 LAB — URINE CULTURE: Culture: NO GROWTH

## 2019-07-22 NOTE — Progress Notes (Signed)
Negative for growth. Are symptoms better? If not could consider an xray if back pain is present and still having trouble urinating.

## 2019-07-23 ENCOUNTER — Other Ambulatory Visit: Payer: Self-pay | Admitting: Family Medicine

## 2019-07-23 DIAGNOSIS — Z1159 Encounter for screening for other viral diseases: Secondary | ICD-10-CM

## 2019-07-23 DIAGNOSIS — R822 Biliuria: Secondary | ICD-10-CM

## 2019-07-23 DIAGNOSIS — E119 Type 2 diabetes mellitus without complications: Secondary | ICD-10-CM

## 2019-07-23 NOTE — Progress Notes (Signed)
Please call and have Alexandria Mejia come get labs today at [facility] before they close. I would like to check her system for infection. I also would like to check liver and kidney function again. If symptom get worse she can go the Urgent Care if needed. I will follow up with her once I get the results.

## 2019-07-26 ENCOUNTER — Telehealth: Payer: Self-pay | Admitting: *Deleted

## 2019-07-26 NOTE — Telephone Encounter (Signed)
Pt called wanting to let Jarrett Soho know that she could not get blood work done Friday but will get it done today. Also will go file for emergency medicaid and plans on having her test on Tuesday. Just wanted to let her know.

## 2019-07-26 NOTE — Telephone Encounter (Signed)
FYI

## 2019-07-28 ENCOUNTER — Ambulatory Visit
Admission: RE | Admit: 2019-07-28 | Discharge: 2019-07-28 | Disposition: A | Discharge status: Home / Self Care | Payer: No Typology Code available for payment source | Source: Ambulatory Visit | Attending: Vascular Surgery | Admitting: Vascular Surgery

## 2019-07-28 DIAGNOSIS — M7989 Other specified soft tissue disorders: Secondary | ICD-10-CM

## 2019-07-28 DIAGNOSIS — I82409 Acute embolism and thrombosis of unspecified deep veins of unspecified lower extremity: Secondary | ICD-10-CM

## 2019-07-28 MED ORDER — IOPAMIDOL (ISOVUE-300) INJECTION 61%
125.0000 mL | Freq: Once | INTRAVENOUS | Status: AC | PRN
  Administered 2019-07-28: 125 mL via INTRAVENOUS

## 2019-08-05 ENCOUNTER — Other Ambulatory Visit: Payer: Self-pay

## 2019-08-05 ENCOUNTER — Encounter: Payer: Self-pay | Admitting: Vascular Surgery

## 2019-08-05 ENCOUNTER — Ambulatory Visit (INDEPENDENT_AMBULATORY_CARE_PROVIDER_SITE_OTHER): Payer: Self-pay | Admitting: Vascular Surgery

## 2019-08-05 VITALS — BP 132/80 | Ht 69.0 in | Wt 217.0 lb

## 2019-08-05 DIAGNOSIS — M7989 Other specified soft tissue disorders: Secondary | ICD-10-CM

## 2019-08-05 DIAGNOSIS — I82409 Acute embolism and thrombosis of unspecified deep veins of unspecified lower extremity: Secondary | ICD-10-CM

## 2019-08-05 NOTE — Progress Notes (Signed)
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 I discussed the limitations of evaluation and management by telemedicine and the availability of in person appointments. The patient expressed understanding and agreed to proceed.  Location: Patient: Home Provider: Office  HPI: Alexandria Mejia is a 48 y.o. female who I last saw on 07/08/2019.  She was referred with varicose veins of the left leg in addition to the left leg pain and swelling.  Her symptoms all began after a trip in July for a 4-day weekend.  She developed significant pain and swelling in the left leg and prompted a duplex scan which showed no evidence of DVT.  The patient has no prior history of DVT.  Venous duplex scan at the time of her last visit showed no evidence of DVT or superficial venous thrombosis on the left.  She did have reflux at the origin of an anterior accessory saphenous vein and some reflux in the great saphenous vein in the proximal thigh only.  The vein was not especially dilated.  There was no deep venous reflux on the left.  Given that her symptoms were quite impressive I felt further was a work-up was indicated to rule out a may Thurner syndrome and ordered a CT venogram.  The results of her CT venogram are discussed below.  Current Outpatient Medications  Medication Sig Dispense Refill  . blood glucose meter kit and supplies Dispense based on patient and insurance preference. Use up to four times daily as directed. (FOR ICD-10 E10.9, E11.9). 1 each 0  . Cholecalciferol (VITAMIN D) 50 MCG (2000 UT) tablet Take 1 tablet (2,000 Units total) by mouth daily.    . cyclobenzaprine  (FEXMID) 7.5 MG tablet Take 1 tablet (7.5 mg total) by mouth 3 (three) times daily as needed for muscle spasms. 30 tablet 0  . ibuprofen (ADVIL) 800 MG tablet Take 800 mg by mouth every 8 (eight) hours as needed for moderate pain.    Marland Kitchen insulin NPH-regular Human (70-30) 100 UNIT/ML injection Inject 20 Units into the skin 2 (two) times daily with a meal. OR AS DIRECTED.  REDUCE DOSE BY 5 UNITS IF YOU HAVE ANY BLOOD SUGAR LESS THAN 100. 10 mL 1  . Insulin Pen Needle 31G X 8 MM MISC 1 Units by Does not apply route as directed. 100 each 1  . metFORMIN (GLUCOPHAGE) 1000 MG tablet Take 1 tablet (1,000 mg total) by mouth 2 (two) times daily with a meal. 90 tablet 1  . omeprazole (PRILOSEC) 20 MG capsule Take 20 mg by mouth daily.     No current facility-administered medications for this visit.    REVIEW OF SYSTEMS: Valu.Nieves ] denotes positive finding; [  ] denotes negative finding  CARDIOVASCULAR:  [ ] chest pain   [ ] dyspnea on exertion  [ ] leg swelling  CONSTITUTIONAL:  [ ] fever   [ ] chills   OBSERVATIONS/OBJECTIVE: Vitals:   08/05/19 0831  BP: 132/80  Weight: 217 lb (98.4 kg)  Height: 5' 9" (1.753 m)   The patient was not short of breath and was alert and oriented.  DATA:  CT  VENOGRAM: I have reviewed the images of her CT venogram that was done on 07/28/2019.  This showed no evidence of deep venous thrombosis, abnormal venous anatomy, mass-effect on any major venous structures, or evidence of significant stenosis in the proximal left common iliac vein.  There was no significant collateral development identified also.  MEDICAL ISSUES:  LEFT LEG SWELLING: The patient had significant leg swelling after a trip in July.  She had to venous duplex scan which showed no evidence of DVT.  She had a CT venogram now which shows no evidence of May Thurner syndrome and no evidence of DVT.  I have again discussed with her the importance of leg elevation.  She can wear compression stockings as needed when she  is going to be standing for a long period of time.  I have encouraged her to avoid prolonged sitting and standing.  We have also discussed the importance of exercise and nutrition.  I will see her back as needed.  FOLLOW UP INSTRUCTIONS:   I discussed the assessment and treatment plan with the patient. The patient was provided an opportunity to ask questions and all were answered. The patient agreed with the plan and demonstrated an understanding of the instructions. The patient was advised to call back or seek an in-person evaluation if the symptoms worsen or if the condition fails to improve as anticipated.  I provided 15 minutes of non-face-to-face time during this encounter.  Alexandria Mejia [facility]

## 2019-08-06 ENCOUNTER — Telehealth: Payer: Self-pay | Admitting: *Deleted

## 2019-08-06 NOTE — Telephone Encounter (Signed)
PT called needing a refill on her insulin and metformin this can be sent to [facility] in [place]

## 2019-08-09 ENCOUNTER — Other Ambulatory Visit: Payer: Self-pay

## 2019-08-09 DIAGNOSIS — E119 Type 2 diabetes mellitus without complications: Secondary | ICD-10-CM

## 2019-08-09 MED ORDER — METFORMIN HCL 1000 MG PO TABS: 1000.0000 mg | ORAL_TABLET | Freq: Two times a day (BID) | ORAL | 1 refills | Status: DC

## 2019-08-09 MED ORDER — INSULIN NPH ISOPHANE & REGULAR (70-30) 100 UNIT/ML ~~LOC~~ SUSP: 20.0000 [IU] | Freq: Two times a day (BID) | SUBCUTANEOUS | 1 refills | Status: DC

## 2019-08-09 NOTE — Telephone Encounter (Signed)
Refills sent in to requested pharmacy  

## 2019-09-17 ENCOUNTER — Ambulatory Visit (INDEPENDENT_AMBULATORY_CARE_PROVIDER_SITE_OTHER): Payer: Self-pay | Admitting: Family Medicine

## 2019-09-17 ENCOUNTER — Encounter: Payer: Self-pay | Admitting: Family Medicine

## 2019-09-17 ENCOUNTER — Other Ambulatory Visit: Payer: Self-pay

## 2019-09-17 VITALS — BP 110/56 | HR 80 | Temp 98.6°F | Resp 15 | Ht 69.0 in | Wt 220.1 lb

## 2019-09-17 DIAGNOSIS — M79605 Pain in left leg: Secondary | ICD-10-CM

## 2019-09-17 DIAGNOSIS — I83892 Varicose veins of left lower extremities with other complications: Secondary | ICD-10-CM

## 2019-09-17 DIAGNOSIS — Z720 Tobacco use: Secondary | ICD-10-CM

## 2019-09-17 DIAGNOSIS — Z7289 Other problems related to lifestyle: Secondary | ICD-10-CM

## 2019-09-17 DIAGNOSIS — E119 Type 2 diabetes mellitus without complications: Secondary | ICD-10-CM

## 2019-09-17 DIAGNOSIS — Z789 Other specified health status: Secondary | ICD-10-CM

## 2019-09-17 NOTE — Progress Notes (Signed)
Subjective:     Patient ID: Alexandria Mejia, female   DOB: January 26, 1971, 48 y.o.   MRN: [id number]  Alexandria Mejia presents for Diabetes (3 mth follow up)  Alexandria Mejia presents today for follow-up on her diabetes in addition to other few concerns that she would like to talk about.  She reports that her family has been sick and she developed a cold and cough.  Reports she is been treating over-the-counter does not really want to be put on any medication as she does not have insurance right now but wanted to know what she could use to help with the cough.  She reports that she is still smoking she had tried to quit and then her uncle got diagnosed with brain cancer and that stressed her out so she started smoking more again.  But does still have the desire to quit.  She reports that she has cut back on her drinking she is not drinking on the weekends nor does she drink Monday through Thursday she drinks about 6 beers on Fridays though.  She reports that her leg is still swelling she uses the compression hose but she gets spasms after wearing it for 4 hours.  She tries to keep her leg elevated.  She reports that she lost her job as she is had a hard time walking around on it.  She is applied for disability.  On 10/7 she will have x-rays and an MD review her records.  On October 13 she will have to meet with a psychologist.   She reports that she needs to get back in with Dr. Aline Brochure as he had ordered an MRI earlier in the year but she could not get it because of COVID issues.  So she will be contacting the office to see if she can get that again.  Even though she knows she does not have insurance right now she might try to go ahead and get it or see if she can get it when she has insurance.  She reports that she was unable to get her labs drawn because of the insurance issue but she is willing to pay for the A1c just to make sure that her diabetes is not checked.  She reports that her fasting  blood sugars are 74 to 130 in the morning.  She reports increase in energy levels and overall she can tell that she is feeling much better since her diabetes has been better controlled.  She has been trying to eat better as she knows that she cannot exercise.  But she has been trying to do arm workouts that she knows she cannot do much with her legs right now.  Today patient denies signs and symptoms of COVID 19 infection including fever, chills, cough, shortness of breath, and headache.  Past Medical, Surgical, Social History, Allergies, and Medications have been Reviewed.   Past Medical History:  Diagnosis Date   Back pain    Complication of anesthesia    patient states "with last surgery I was hard to wake up".   Diabetes mellitus without complication ([place])    DKA (diabetic ketoacidoses) ([facility])    GERD (gastroesophageal reflux disease)    Hypotension    Migraine    Past Surgical History:  Procedure Laterality Date   APPENDECTOMY     BACK SURGERY     lumbar-herniated disc   CHOLECYSTECTOMY     FLEXOR TENOTOMY Right 10/12/2014   Procedure: BICEPS TENOTOMY;  Surgeon:  Carole Civil, MD;  Location: [facility];  Service: Orthopedics;  Laterality: Right;   NECK SURGERY     decompression of 7 disc   SHOULDER ARTHROSCOPY WITH ROTATOR CUFF REPAIR Right 05/27/2014   Procedure: SHOULDER ARTHROSCOPY WITH ROTATOR CUFF REPAIR, subscalpularis repair, open supraspinatus repair;  Surgeon: Carole Civil, MD;  Location: [facility];  Service: Orthopedics;  Laterality: Right;   SHOULDER OPEN ROTATOR CUFF REPAIR Right 10/12/2014   Procedure: OPEN ROTATOR CUFF REPAIR SHOULDER;  Surgeon: Carole Civil, MD;  Location: [facility];  Service: Orthopedics;  Laterality: Right;   ULNAR NERVE TRANSPOSITION Right 11/13/2015   Procedure: RIGHT ULNAR NERVE TRANSPOSITION;  Surgeon: Milly Jakob, MD;  Location: [facility];  Service: Orthopedics;  Laterality: Right;   Social History     Socioeconomic History   Marital status: Single    Spouse name: Not on file   Number of children: Not on file   Years of education: Not on file   Highest education level: High school graduate  Occupational History   Not on file  Social Needs   Financial resource strain: Not very hard   Food insecurity    Worry: Sometimes true    Inability: Sometimes true   Transportation needs    Medical: No    Non-medical: No  Tobacco Use   Smoking status: Current Some Day Smoker    Packs/day: 0.50    Years: 30.00    Pack years: 15.00    Types: Cigarettes   Smokeless tobacco: Never Used  Substance and Sexual Activity   Alcohol use: Yes    Comment: occ   Drug use: No   Sexual activity: Yes    Birth control/protection: None    Comment: female partners only  Lifestyle   Physical activity    Days per week: 0 days    Minutes per session: 0 min   Stress: To some extent  Relationships   Social connections    Talks on phone: More than three times a week    Gets together: More than three times a week    Attends religious service: Never    Active member of club or organization: No    Attends meetings of clubs or organizations: Never    Relationship status: Never married   Intimate partner violence    Fear of current or ex partner: No    Emotionally abused: No    Physically abused: No    Forced sexual activity: No  Other Topics Concern   Not on file  Social History Narrative   Living with mom   No pets      Enjoy: fish, karaoke-turn the page by bob seger, hanging with friends      Diet: overall good, drinks, extra sugar, chips, meat   Caffeine: not really drinking it   Water: 8 cups plus daily      Wears seatbelt   Wears Suncreen   Smoke detectors carbon monoxide   Does not use phone while driving             Outpatient Encounter Medications as of 09/17/2019  Medication Sig   blood glucose meter kit and supplies Dispense based on patient and insurance  preference. Use up to four times daily as directed. (FOR ICD-10 E10.9, E11.9).   Cholecalciferol (VITAMIN D) 50 MCG (2000 UT) tablet Take 1 tablet (2,000 Units total) by mouth daily.   cyclobenzaprine (FEXMID) 7.5 MG tablet Take 1 tablet (7.5 mg total) by mouth  3 (three) times daily as needed for muscle spasms.   ibuprofen (ADVIL) 800 MG tablet Take 800 mg by mouth every 8 (eight) hours as needed for moderate pain.   insulin NPH-regular Human (70-30) 100 UNIT/ML injection Inject 20 Units into the skin 2 (two) times daily with a meal. OR AS DIRECTED.  REDUCE DOSE BY 5 UNITS IF YOU HAVE ANY BLOOD SUGAR LESS THAN 100.   Insulin Pen Needle 31G X 8 MM MISC 1 Units by Does not apply route as directed.   metFORMIN (GLUCOPHAGE) 1000 MG tablet Take 1 tablet (1,000 mg total) by mouth 2 (two) times daily with a meal.   omeprazole (PRILOSEC) 20 MG capsule Take 20 mg by mouth daily.   No facility-administered encounter medications on file as of 09/17/2019.    Allergies  Allergen Reactions   Codeine Itching   Sulfa Antibiotics Itching and Swelling    Review of Systems  Constitutional: Negative for chills and fever.  HENT: Negative.   Eyes: Negative.   Respiratory: Positive for cough.   Cardiovascular: Positive for leg swelling. Negative for chest pain.  Gastrointestinal: Negative.   Endocrine: Negative.  Negative for polydipsia, polyphagia and polyuria.  Genitourinary: Negative.   Musculoskeletal: Positive for arthralgias.       See hpi  Skin: Negative.   Allergic/Immunologic: Negative.   Neurological: Negative.   Hematological: Negative.   Psychiatric/Behavioral: Negative.   All other systems reviewed and are negative.      Objective:     BP (!) 110/56    Pulse 80    Temp 98.6 F (37 C) (Oral)    Resp 15    Ht 5' 9"  (1.753 m)    Wt 220 lb 1.9 oz (99.8 kg)    SpO2 99%    BMI 32.51 kg/m   Physical Exam Vitals signs and nursing note reviewed.  Constitutional:      Appearance:  Normal appearance. She is obese.  HENT:     Head: Normocephalic and atraumatic.     Right Ear: External ear normal.     Left Ear: External ear normal.     Nose: Nose normal.     Mouth/Throat:     Pharynx: Oropharynx is clear.  Eyes:     General:        Right eye: No discharge.        Left eye: No discharge.     Conjunctiva/sclera: Conjunctivae normal.  Neck:     Musculoskeletal: Normal range of motion and neck supple.  Cardiovascular:     Rate and Rhythm: Normal rate and regular rhythm.     Pulses: Normal pulses.     Heart sounds: Normal heart sounds.  Pulmonary:     Effort: Pulmonary effort is normal.     Breath sounds: Normal air entry. No stridor, decreased air movement or transmitted upper airway sounds. Examination of the left-middle field reveals wheezing. Wheezing present. No decreased breath sounds.  Musculoskeletal:     Left knee: She exhibits decreased range of motion.     Right lower leg: No edema.     Left lower leg: Edema present.  Skin:    General: Skin is warm.  Neurological:     General: No focal deficit present.     Mental Status: She is alert and oriented to person, place, and time.  Psychiatric:        Mood and Affect: Mood normal.        Behavior: Behavior normal.  Thought Content: Thought content normal.        Judgment: Judgment normal.        Assessment and Plan        1. Type 2 diabetes mellitus without complication, unspecified whether long term insulin use ([place]) Alexandria Mejia is encouraged to check blood sugar daily as directed. Continue current medications. She needs to get on a statin.  But she wants to try to see if she can get her diet under control first. Educated on importance of maintain a well balanced diabetic friendly diet.  She is reminded the importance of maintaining  good blood sugars,  taking medications as directed, daily foot care, annual eye exams. Additionally educated about keeping good control over blood  pressure and cholesterol as well.  A1c was checked today pending the findings we will adjust medications if needed.  2. Varicose veins of left leg with edema Has been followed by the [facility] and there is nothing at this time that they feel like she needs to have done.  Advised for her to continue to wear compressions.  Appreciated their collaboration in her care.  3. Pain of left lower extremity She reports still having a lot of discomfort and pain in her left lower extremity.  Advised to continue to wear the compression as this is the best pain reliever.  She reports that she gets spasms after taking it off.  Advised for her to wear it for shorter periods of time and to make sure she keeps her leg elevated.  Additionally told her that she needs to stay mobile as much as possible.  4. Nicotine abuse Asked about quitting: confirms they are currently smokes cigarettes Advise to quit smoking: Educated about QUITTING to reduce the risk of cancer, cardio and cerebrovascular disease. Assess willingness: Trying to quit at this time,  is working on cutting back. Assist with counseling and pharmacotherapy: Counseled for 5 minutes and literature provided. Arrange for follow up: Trying quitting follow up in 3 months and continue to offer help.    5. Alcohol use Still has a little bit of a binge drink in 1 day over the week.  Advised that binge drinking can be just as detrimental as having a few beers over the course of the week each night.  Encouraged her to try to reduce how much she drinks in 1 night and see if she could spread it out if she wants to continue to drink.  Follow-up: 12/21/2019  Alexandria Mayo, DNP, AGNP-BC [address], [address] [place], [place] [address] Office Hours: Mon-Thurs 8 am-5 pm; Fri 8 am-12 pm Office Phone:  [phone]  Office Fax: [phone]

## 2019-09-17 NOTE — Patient Instructions (Signed)
    I appreciate the opportunity to provide you with the care for your health and wellness. Today we discussed: overall health  Follow up: 3 months phone   No labs or referrals today  Cough/Cold: Delsym, salt water gargles, Mucinex as needed.  Make sure that she stay hydrated as well as you can.  And get fresh fruits and vegetables to boost your immune system.  Continue all medications currently and noted that we will make it a little tight given the fact that he did not have insurance at this time but it will definitely pay off for you to continue all your medications.  Continue to wear the compression hose as tolerated.  And keep your legs elevated.  Contact Dr. Ruthe Mannan office for the MRI to get reordered.    Congratulations on reducing your alcohol and tobacco use.  Stay is positive as you can.  Please continue to practice social distancing to keep you, your family, and our community safe.   GET FRESH AIR DAILY. STAY HYDRATED WITH WATER.   It was a pleasure to see you and I look forward to continuing to work together on your health and well-being. Please do not hesitate to call the office if you need care or have questions about your care.  Have a wonderful day and week. With Gratitude, Cherly Beach, DNP, AGNP-BC

## 2019-09-18 LAB — HEMOGLOBIN A1C
Hgb A1c MFr Bld: 6.4 % of total Hgb — ABNORMAL HIGH (ref <5.7)
Mean Plasma Glucose: 137 (calc)
eAG (mmol/L): 7.6 (calc)

## 2019-09-19 NOTE — Progress Notes (Signed)
Please call and let Alexandria Mejia know that her A1c is down to 6.4%! This clearly demonstrates her commitment to her health care especially regarding her DM.  Extremely proud and happy for her. Review if she has had any blood sugars lower than 70, and if not we will continue current medications as they are ordered and look for any adjustments at next A1c recheck in 3 months.

## 2019-09-30 ENCOUNTER — Telehealth: Payer: Self-pay | Admitting: *Deleted

## 2019-09-30 ENCOUNTER — Other Ambulatory Visit: Payer: Self-pay | Admitting: Family Medicine

## 2019-09-30 DIAGNOSIS — E119 Type 2 diabetes mellitus without complications: Secondary | ICD-10-CM

## 2019-09-30 MED ORDER — METFORMIN HCL 1000 MG PO TABS: 1000.0000 mg | ORAL_TABLET | Freq: Two times a day (BID) | ORAL | 1 refills | Status: DC

## 2019-09-30 NOTE — Telephone Encounter (Signed)
Advised patient she should only be taking 1,000mg  (1 tablet) by mouth twice daily. She repeated the directions back to me with verbal understanding.

## 2019-09-30 NOTE — Telephone Encounter (Signed)
Pt called [facility] to refill metformin but the prescription say 1 pill in the morning and 1 at night. Pt stated she is taking 2 pills in the morning and 2 at night and she is out of the medication but cant get it refilled until November. Needs clarification

## 2019-09-30 NOTE — Telephone Encounter (Signed)
Please advise 

## 2019-10-10 ENCOUNTER — Emergency Department (HOSPITAL_COMMUNITY)
Admission: EM | Admit: 2019-10-10 | Discharge: 2019-10-10 | Disposition: A | Discharge status: Home / Self Care | Payer: Self-pay | Attending: Emergency Medicine | Admitting: Emergency Medicine

## 2019-10-10 ENCOUNTER — Emergency Department (HOSPITAL_COMMUNITY): Payer: Self-pay

## 2019-10-10 ENCOUNTER — Other Ambulatory Visit: Payer: Self-pay

## 2019-10-10 ENCOUNTER — Encounter (HOSPITAL_COMMUNITY): Payer: Self-pay | Admitting: Emergency Medicine

## 2019-10-10 DIAGNOSIS — R519 Headache, unspecified: Secondary | ICD-10-CM | POA: Insufficient documentation

## 2019-10-10 DIAGNOSIS — W010XXA Fall on same level from slipping, tripping and stumbling without subsequent striking against object, initial encounter: Secondary | ICD-10-CM | POA: Insufficient documentation

## 2019-10-10 DIAGNOSIS — Y929 Unspecified place or not applicable: Secondary | ICD-10-CM | POA: Insufficient documentation

## 2019-10-10 DIAGNOSIS — S60512A Abrasion of left hand, initial encounter: Secondary | ICD-10-CM

## 2019-10-10 DIAGNOSIS — Y939 Activity, unspecified: Secondary | ICD-10-CM | POA: Insufficient documentation

## 2019-10-10 DIAGNOSIS — Y999 Unspecified external cause status: Secondary | ICD-10-CM | POA: Insufficient documentation

## 2019-10-10 DIAGNOSIS — Z794 Long term (current) use of insulin: Secondary | ICD-10-CM | POA: Insufficient documentation

## 2019-10-10 DIAGNOSIS — E119 Type 2 diabetes mellitus without complications: Secondary | ICD-10-CM | POA: Insufficient documentation

## 2019-10-10 DIAGNOSIS — Y92009 Unspecified place in unspecified non-institutional (private) residence as the place of occurrence of the external cause: Secondary | ICD-10-CM

## 2019-10-10 DIAGNOSIS — F17210 Nicotine dependence, cigarettes, uncomplicated: Secondary | ICD-10-CM | POA: Insufficient documentation

## 2019-10-10 DIAGNOSIS — Z79899 Other long term (current) drug therapy: Secondary | ICD-10-CM | POA: Insufficient documentation

## 2019-10-10 DIAGNOSIS — Z23 Encounter for immunization: Secondary | ICD-10-CM | POA: Insufficient documentation

## 2019-10-10 DIAGNOSIS — S0003XA Contusion of scalp, initial encounter: Secondary | ICD-10-CM

## 2019-10-10 DIAGNOSIS — W19XXXA Unspecified fall, initial encounter: Secondary | ICD-10-CM

## 2019-10-10 DIAGNOSIS — S20212A Contusion of left front wall of thorax, initial encounter: Secondary | ICD-10-CM

## 2019-10-10 LAB — URINALYSIS, ROUTINE W REFLEX MICROSCOPIC
Bilirubin Urine: NEGATIVE
Glucose, UA: NEGATIVE mg/dL
Hgb urine dipstick: NEGATIVE
Ketones, ur: NEGATIVE mg/dL
Nitrite: NEGATIVE
Protein, ur: NEGATIVE mg/dL
Specific Gravity, Urine: 1.027 (ref 1.005–1.030)
pH: 5 (ref 5.0–8.0)

## 2019-10-10 LAB — POC URINE PREG, ED: Preg Test, Ur: NEGATIVE

## 2019-10-10 MED ORDER — OXYCODONE-ACETAMINOPHEN 5-325 MG PO TABS
2.0000 | ORAL_TABLET | Freq: Once | ORAL | Status: AC
  Administered 2019-10-10: 19:00:00 2 via ORAL
  Filled 2019-10-10: qty 2

## 2019-10-10 MED ORDER — CYCLOBENZAPRINE HCL 10 MG PO TABS: 10.0000 mg | ORAL_TABLET | Freq: Three times a day (TID) | ORAL | 0 refills | Status: DC

## 2019-10-10 MED ORDER — HYDROXYZINE HCL 25 MG PO TABS
25.0000 mg | ORAL_TABLET | Freq: Once | ORAL | Status: AC
  Administered 2019-10-10: 19:00:00 25 mg via ORAL
  Filled 2019-10-10: qty 1

## 2019-10-10 MED ORDER — TETANUS-DIPHTH-ACELL PERTUSSIS 5-2.5-18.5 LF-MCG/0.5 IM SUSP
0.5000 mL | Freq: Once | INTRAMUSCULAR | Status: AC
  Administered 2019-10-10: 0.5 mL via INTRAMUSCULAR
  Filled 2019-10-10: qty 0.5

## 2019-10-10 MED ORDER — METHOCARBAMOL 500 MG PO TABS
500.0000 mg | ORAL_TABLET | Freq: Once | ORAL | Status: AC
  Administered 2019-10-10: 500 mg via ORAL
  Filled 2019-10-10: qty 1

## 2019-10-10 MED ORDER — HYDROCODONE-ACETAMINOPHEN 5-325 MG PO TABS: 1.0000 | ORAL_TABLET | ORAL | 0 refills | Status: DC | PRN

## 2019-10-10 NOTE — ED Triage Notes (Addendum)
Pt reports that she was walking down stairs when her left leg gave out. Pt fell on LT side and is c/o of LT hand pain, LT ribcage pain, and LT mid back pain. Pt endorses pain with deep inspiration. Pt also reports that she hit back of head, but no LOC.

## 2019-10-10 NOTE — ED Notes (Signed)
Patient transported to X-ray 

## 2019-10-10 NOTE — ED Provider Notes (Signed)
[facility] EMERGENCY DEPARTMENT Provider Note   CSN: [id number] Arrival date & time: 10/10/19  1753     History   Chief Complaint Chief Complaint  Patient presents with  . Fall    HPI Alexandria Mejia is a 48 y.o. female.     The history is provided by the patient.  Fall This is a new problem. Associated symptoms include headaches. Pertinent negatives include no chest pain, no abdominal pain and no shortness of breath. Associated symptoms comments: Left side pain and mid back pain, left hand pain.. The symptoms are aggravated by standing. Nothing relieves the symptoms. She has tried acetaminophen and rest for the symptoms.    Past Medical History:  Diagnosis Date  . Back pain   . Complication of anesthesia    patient states "with last surgery I was hard to wake up".  . Diabetes mellitus without complication ([place])   . DKA (diabetic ketoacidoses) ([place])   . GERD (gastroesophageal reflux disease)   . Hypotension   . Migraine     Patient Active Problem List   Diagnosis Date Noted  . Varicose veins of left leg with edema 09/17/2019  . Type 2 diabetes mellitus without complication ([place]) 18/84/1660  . Pain of left lower extremity 07/20/2019  . Abdominal wall abscess 05/29/2019  . DKA (diabetic ketoacidoses) ([place]) 05/28/2019  . Skin abscess 05/28/2019  . GERD (gastroesophageal reflux disease)   . Cellulitis   . Leukocytosis   . H/O repair of right rotator cuff 10/24/2014  . Partial tear of rotator cuff 03/01/2014  . Shoulder injury 02/14/2014  . Rotator cuff tear, right 02/14/2014  . Routine general medical examination at a health care facility 09/17/2012  . Shoulder pain 09/17/2012  . Stress 09/17/2012  . Candidiasis, intertrigo 09/17/2012  . Obesity, unspecified 08/04/2012  . Chronic back pain 08/04/2012  . Nausea 08/04/2012  . Tobacco user 08/04/2012    Past Surgical History:  Procedure Laterality Date  . APPENDECTOMY    . BACK SURGERY     lumbar-herniated disc  . CHOLECYSTECTOMY    . FLEXOR TENOTOMY Right 10/12/2014   Procedure: BICEPS TENOTOMY;  Surgeon: Carole Civil, MD;  Location: [facility];  Service: Orthopedics;  Laterality: Right;  . NECK SURGERY     decompression of 7 disc  . SHOULDER ARTHROSCOPY WITH ROTATOR CUFF REPAIR Right 05/27/2014   Procedure: SHOULDER ARTHROSCOPY WITH ROTATOR CUFF REPAIR, subscalpularis repair, open supraspinatus repair;  Surgeon: Carole Civil, MD;  Location: [facility];  Service: Orthopedics;  Laterality: Right;  . SHOULDER OPEN ROTATOR CUFF REPAIR Right 10/12/2014   Procedure: OPEN ROTATOR CUFF REPAIR SHOULDER;  Surgeon: Carole Civil, MD;  Location: [facility];  Service: Orthopedics;  Laterality: Right;  . ULNAR NERVE TRANSPOSITION Right 11/13/2015   Procedure: RIGHT ULNAR NERVE TRANSPOSITION;  Surgeon: Milly Jakob, MD;  Location: [facility];  Service: Orthopedics;  Laterality: Right;     OB History   No obstetric history on file.      Home Medications    Prior to Admission medications   Medication Sig Start Date End Date Taking? Authorizing Provider  blood glucose meter kit and supplies Dispense based on patient and insurance preference. Use up to four times daily as directed. (FOR ICD-10 E10.9, E11.9). 05/29/19   Johnson, Clanford L, MD  Cholecalciferol (VITAMIN D) 50 MCG (2000 UT) tablet Take 1 tablet (2,000 Units total) by mouth daily. 05/29/19   Johnson, Clanford L, MD  cyclobenzaprine (FEXMID) 7.5 MG  tablet Take 1 tablet (7.5 mg total) by mouth 3 (three) times daily as needed for muscle spasms. 07/20/19   Perlie Mayo, NP  ibuprofen (ADVIL) 800 MG tablet Take 800 mg by mouth every 8 (eight) hours as needed for moderate pain.    [provider]  insulin NPH-regular Human (70-30) 100 UNIT/ML injection Inject 20 Units into the skin 2 (two) times daily with a meal. OR AS DIRECTED.  REDUCE DOSE BY 5 UNITS IF YOU HAVE ANY BLOOD SUGAR LESS THAN 100. 08/09/19    Perlie Mayo, NP  Insulin Pen Needle 31G X 8 MM MISC 1 Units by Does not apply route as directed. 05/29/19   Wynetta Emery, Clanford L, MD  metFORMIN (GLUCOPHAGE) 1000 MG tablet Take 1 tablet (1,000 mg total) by mouth 2 (two) times daily with a meal. 09/30/19   Perlie Mayo, NP  omeprazole (PRILOSEC) 20 MG capsule Take 20 mg by mouth daily.    [provider]    Family History Family History  Problem Relation Age of Onset  . COPD Mother   . Arthritis Mother   . Diabetes Father   . Heart disease Father   . Hypertension Brother   . Diabetes Paternal Grandmother   . Diabetes Maternal Grandmother   . Dementia Paternal Grandfather     Social History Social History   Tobacco Use  . Smoking status: Current Some Day Smoker    Packs/day: 0.50    Years: 30.00    Pack years: 15.00    Types: Cigarettes  . Smokeless tobacco: Never Used  Substance Use Topics  . Alcohol use: Yes    Comment: occ  . Drug use: No     Allergies   Codeine and Sulfa antibiotics   Review of Systems Review of Systems  Constitutional: Negative for activity change.       All ROS Neg except as noted in HPI  HENT: Negative.   Eyes: Negative for photophobia and discharge.  Respiratory: Negative for cough, shortness of breath and wheezing.   Cardiovascular: Negative for chest pain and palpitations.  Gastrointestinal: Negative for abdominal pain and blood in stool.  Genitourinary: Negative for dysuria, frequency and hematuria.  Musculoskeletal: Positive for arthralgias, back pain and neck pain.  Skin: Negative.   Neurological: Positive for headaches. Negative for dizziness, seizures and speech difficulty.  Psychiatric/Behavioral: Negative for confusion and hallucinations.     Physical Exam Updated Vital Signs BP 108/73 (BP Location: Right Arm)   Pulse 83   Temp 98 F (36.7 C) (Oral)   Resp 18   Ht 5' 9"  (1.753 m)   Wt 99.8 kg   LMP 09/19/2019 (Exact Date)   SpO2 99%   BMI 32.49 kg/m    Physical Exam Vitals signs and nursing note reviewed.  Constitutional:      Appearance: She is well-developed. She is not toxic-appearing.  HENT:     Head: Normocephalic. No raccoon eyes or Battle's sign.      Right Ear: Tympanic membrane and external ear normal.     Left Ear: Tympanic membrane and external ear normal.  Eyes:     General: Lids are normal.     Pupils: Pupils are equal, round, and reactive to light.  Neck:     Musculoskeletal: Normal range of motion and neck supple.     Vascular: No carotid bruit.  Cardiovascular:     Rate and Rhythm: Normal rate and regular rhythm.     Pulses: Normal pulses.  Heart sounds: Normal heart sounds.  Pulmonary:     Effort: No respiratory distress.     Breath sounds: Normal breath sounds.    Chest:     Chest wall: Tenderness present. No crepitus.    Abdominal:     General: Bowel sounds are normal.     Palpations: Abdomen is soft.     Tenderness: There is no abdominal tenderness. There is no guarding.  Musculoskeletal: Normal range of motion.     Thoracic back: She exhibits pain and spasm.     Left hand: She exhibits tenderness. Normal sensation noted.       Hands:  Lymphadenopathy:     Head:     Right side of head: No submandibular adenopathy.     Left side of head: No submandibular adenopathy.     Cervical: No cervical adenopathy.  Skin:    General: Skin is warm and dry.  Neurological:     General: No focal deficit present.     Mental Status: She is alert and oriented to person, place, and time.     Cranial Nerves: No cranial nerve deficit.     Sensory: No sensory deficit.     Motor: No weakness.     Coordination: Coordination normal.     Gait: Gait normal.  Psychiatric:        Mood and Affect: Mood normal.        Speech: Speech normal.      ED Treatments / Results  Labs (all labs ordered are listed, but only abnormal results are displayed) Labs Reviewed - No data to display  EKG None  Radiology No  results found.  Procedures Procedures (including critical care time)  Medications Ordered in ED Medications - No data to display   Initial Impression / Assessment and Plan / ED Course  I have reviewed the triage vital signs and the nursing notes.  Pertinent labs & imaging results that were available during my care of the patient were reviewed by me and considered in my medical decision making (see chart for details).          Final Clinical Impressions(s) / ED Diagnoses MDm  Vital signs within normal limits.  Pulse oximetry is 99% on room air.  Within normal limits by my interpretation.   Final diagnoses:  Contusion of rib on left side, initial encounter  Abrasion of left hand, initial encounter  Fall in home, initial encounter  Contusion of scalp, initial encounter    ED Discharge Orders         Ordered    HYDROcodone-acetaminophen (NORCO/VICODIN) 5-325 MG tablet  Every 4 hours PRN     10/10/19 2027    cyclobenzaprine (FLEXERIL) 10 MG tablet  3 times daily     10/10/19 2027           Lily Kocher, PA-C 10/12/19 1232    Milton Ferguson, MD 10/12/19 1232

## 2019-10-10 NOTE — Discharge Instructions (Addendum)
The CT scan of your head and neck are negative for acute changes.  The x-ray of your ribs and chest are negative for fracture or injury to your rib to your lung.  The x-ray of your hand is negative for fracture or dislocation.  Please cleanse the wound daily with soap and water and apply a fresh bandage daily. Please apply an ice pack.  Use extra strength Tylenol and Flexeril with breakfast, lunch, dinner.  May use Norco for more severe pain. This medication may cause drowsiness. Please do not drink, drive, or participate in activity that requires concentration while taking this medication.  Please see Ms. Jerelene Redden, or return to the emergency department if not improving.

## 2019-12-20 ENCOUNTER — Telehealth: Payer: Self-pay | Admitting: *Deleted

## 2019-12-20 NOTE — Telephone Encounter (Signed)
Pt wanted to see if Dahlia Client wanted to check A1C before phone visit tomorrow would like a call back

## 2019-12-20 NOTE — Telephone Encounter (Signed)
Spoke with patient and let her know there are no orders for labs and provider is out of the office today. She will order labs tomorrow if needed with verbal understanding.

## 2019-12-21 ENCOUNTER — Ambulatory Visit (INDEPENDENT_AMBULATORY_CARE_PROVIDER_SITE_OTHER): Payer: Self-pay | Admitting: Family Medicine

## 2019-12-21 ENCOUNTER — Encounter: Payer: Self-pay | Admitting: Family Medicine

## 2019-12-21 ENCOUNTER — Other Ambulatory Visit: Payer: Self-pay

## 2019-12-21 VITALS — Ht 69.0 in | Wt 220.0 lb

## 2019-12-21 DIAGNOSIS — Z6832 Body mass index (BMI) 32.0-32.9, adult: Secondary | ICD-10-CM

## 2019-12-21 DIAGNOSIS — R109 Unspecified abdominal pain: Secondary | ICD-10-CM

## 2019-12-21 DIAGNOSIS — E6609 Other obesity due to excess calories: Secondary | ICD-10-CM

## 2019-12-21 DIAGNOSIS — Z720 Tobacco use: Secondary | ICD-10-CM

## 2019-12-21 DIAGNOSIS — E119 Type 2 diabetes mellitus without complications: Secondary | ICD-10-CM

## 2019-12-21 NOTE — Assessment & Plan Note (Signed)
Unchanged  ELKY FUNCHES is educated about the importance of exercise daily to help with weight management. A minumum of 30 minutes daily is recommended. Additionally, importance of healthy food choices  with portion control discussed.  Wt Readings from Last 3 Encounters:  12/21/19 220 lb (99.8 kg)  10/10/19 220 lb (99.8 kg)  09/17/19 220 lb 1.9 oz (99.8 kg)

## 2019-12-21 NOTE — Assessment & Plan Note (Signed)
Asked about quitting: confirms they are currently smokes cigarettes Advise to quit smoking: Educated about QUITTING to reduce the risk of cancer, cardio and cerebrovascular disease. Assess willingness: Unwilling to quit at this time, but is working on cutting back. Assist with counseling and pharmacotherapy: Counseled for 5 minutes and literature provided. Arrange for follow up:  not quitting follow up in 3 months and continue to offer help.   

## 2019-12-21 NOTE — Progress Notes (Signed)
Virtual Visit via Telephone Note   This visit type was conducted due to national recommendations for restrictions regarding the COVID-19 Pandemic (e.g. social distancing) in an effort to limit this patient's exposure and mitigate transmission in our community.  Due to her co-morbid illnesses, this patient is at least at moderate risk for complications without adequate follow up.  This format is felt to be most appropriate for this patient at this time.  The patient did not have access to video technology/had technical difficulties with video requiring transitioning to audio format only (telephone).  All issues noted in this document were discussed and addressed.  No physical exam could be performed with this format.    Evaluation Performed:  Follow-up visit  Date:  12/21/2019   ID:  Alexandria Mejia, DOB 1971/08/15, MRN [id number]  Patient Location: Home Provider Location: Office  Location of Patient: Home Location of Provider: Telehealth Consent was obtain for visit to be over via telehealth. I verified that I am speaking with the correct person using two identifiers.  PCP:  Perlie Mayo, NP   Chief Complaint: Follow-up on diabetes and obesity  History of Present Illness:    Alexandria Mejia is a 49 y.o. female with history of back pain, obesity, diabetes, GERD, migraine, nicotine use.  Diabetes follow-up:  Blood sugars at home are running 96-136.  Denies hypoglycemia.  Denies polydipsia and polyuria.  Last eye exam was never-needs this.  Last foot exam in office was never-needs this .  Last A1c 6.4% . Denies non-healing wounds or rashes.  Patient follows a low sugar diet and checks feet regularly without concerns. Reports staying hydrated by drinking water. Reports thinking she has flank/kidney pain and is worried it might be a stone or infection. Denies blood in urine, increased frequency, burning sensation or urgency.  Reports she is having the flank/kidney pain.  Reports  that she has reduced her smoking to 6 to 7 cigarettes thinks to seeing someone new in her life he does not care for smoking so she is trying to quit that.  Her weight has not really changed much over the last couple months she reports that she could be a little bit more active but she has trouble with her knee and her back at times.  She is trying to get back in with Dr. Aline Brochure to get her knee taken care of.  The patient does not have symptoms concerning for COVID-19 infection (fever, chills, cough, or new shortness of breath).   Past Medical, Surgical, Social History, Allergies, and Medications have been Reviewed.  Past Medical History:  Diagnosis Date  . Back pain   . Complication of anesthesia    patient states "with last surgery I was hard to wake up".  . Diabetes mellitus without complication ([place])   . DKA (diabetic ketoacidoses) ([place])   . GERD (gastroesophageal reflux disease)   . Hypotension   . Migraine    Past Surgical History:  Procedure Laterality Date  . APPENDECTOMY    . BACK SURGERY     lumbar-herniated disc  . CHOLECYSTECTOMY    . FLEXOR TENOTOMY Right 10/12/2014   Procedure: BICEPS TENOTOMY;  Surgeon: Carole Civil, MD;  Location: [facility];  Service: Orthopedics;  Laterality: Right;  . NECK SURGERY     decompression of 7 disc  . SHOULDER ARTHROSCOPY WITH ROTATOR CUFF REPAIR Right 05/27/2014   Procedure: SHOULDER ARTHROSCOPY WITH ROTATOR CUFF REPAIR, subscalpularis repair, open supraspinatus repair;  Surgeon: Dorothyann Peng  Elita Quick, MD;  Location: [facility];  Service: Orthopedics;  Laterality: Right;  . SHOULDER OPEN ROTATOR CUFF REPAIR Right 10/12/2014   Procedure: OPEN ROTATOR CUFF REPAIR SHOULDER;  Surgeon: Vickki Hearing, MD;  Location: [facility];  Service: Orthopedics;  Laterality: Right;  . ULNAR NERVE TRANSPOSITION Right 11/13/2015   Procedure: RIGHT ULNAR NERVE TRANSPOSITION;  Surgeon: Mack Hook, MD;  Location: [facility];  Service:  Orthopedics;  Laterality: Right;     Current Meds  Medication Sig  . Cholecalciferol (VITAMIN D) 50 MCG (2000 UT) tablet Take 1 tablet (2,000 Units total) by mouth daily.  . cyclobenzaprine (FLEXERIL) 10 MG tablet Take 1 tablet (10 mg total) by mouth 3 (three) times daily.  Marland Kitchen HYDROcodone-acetaminophen (NORCO/VICODIN) 5-325 MG tablet Take 1 tablet by mouth every 4 (four) hours as needed.  Marland Kitchen ibuprofen (ADVIL) 800 MG tablet Take 800 mg by mouth every 8 (eight) hours as needed for moderate pain.  Marland Kitchen insulin NPH-regular Human (70-30) 100 UNIT/ML injection Inject 20 Units into the skin 2 (two) times daily with a meal. OR AS DIRECTED.  REDUCE DOSE BY 5 UNITS IF YOU HAVE ANY BLOOD SUGAR LESS THAN 100. (Patient taking differently: Inject 15 Units into the skin 2 (two) times daily with a meal. OR AS DIRECTED.  REDUCE DOSE BY 5 UNITS IF YOU HAVE ANY BLOOD SUGAR LESS THAN 100.)  . metFORMIN (GLUCOPHAGE) 1000 MG tablet Take 1 tablet (1,000 mg total) by mouth 2 (two) times daily with a meal.  . omeprazole (PRILOSEC) 20 MG capsule Take 20 mg by mouth daily.     Allergies:   Codeine and Sulfa antibiotics   ROS:   Please see the history of present illness.    All other systems reviewed and are negative.   Labs/Other Tests and Data Reviewed:    Recent Labs: 05/28/2019: TSH 3.176 05/29/2019: Magnesium 1.8 05/30/2019: BUN 16; Creatinine, Ser 0.48; Hemoglobin 14.4; Platelets 257; Potassium 3.7; Sodium 137   Recent Lipid Panel Lab Results  Component Value Date/Time   CHOL 207 (H) 08/05/2012 07:45 AM   TRIG 159 (H) 08/05/2012 07:45 AM   HDL 36 (L) 08/05/2012 07:45 AM   CHOLHDL 5.8 08/05/2012 07:45 AM   LDLCALC 139 (H) 08/05/2012 07:45 AM    Wt Readings from Last 3 Encounters:  10/10/19 220 lb (99.8 kg)  09/17/19 220 lb 1.9 oz (99.8 kg)  08/05/19 217 lb (98.4 kg)     Objective:    Vital Signs:  There were no vitals taken for this visit.   GEN:  alert and oriented  RESPIRATORY:  no shortness of  breath in conversation PSYCH:  normal affect and mood   ASSESSMENT & PLAN:     1. Type 2 diabetes mellitus without complication, unspecified whether long term insulin use (HCC)  2. Class 1 obesity due to excess calories with serious comorbidity and body mass index (BMI) of 32.0 to 32.9 in adult  3. Flank pain *never came in for sample to be left.    4. Nicotine abuse   Time:   Today, I have spent 10 minutes with the patient with telehealth technology discussing the above problems.     Medication Adjustments/Labs and Tests Ordered: Current medicines are reviewed at length with the patient today.  Concerns regarding medicines are outlined above.   Tests Ordered: No orders of the defined types were placed in this encounter.   Medication Changes: No orders of the defined types were placed in this encounter.  Disposition:  Follow up 3 months  Signed, Freddy Finner, NP  12/21/2019 8:22 AM     [facility]

## 2019-12-21 NOTE — Assessment & Plan Note (Signed)
Alexandria Mejia is encouraged to check blood sugar daily as directed. Continue current medications. Educated on importance of maintain a well balanced diabetic friendly diet.  She is reminded the importance of maintaining  good blood sugars,  taking medications as directed, daily foot care, annual eye exams. Additionally educated about keeping good control over blood pressure and cholesterol as well.  Will be coming in for annual visit will get A1c, foot exam and referral for eye exam at that visit.

## 2019-12-21 NOTE — Patient Instructions (Signed)
I appreciate the opportunity to provide you with care for your health and wellness. Today we discussed: possible kidney infection/stone, diabetes, and wt  Follow up: 3 months for annual in office  No labs or referrals today  Continue to take all medications as ordered  [place] job on working to stop smoking! KEEP GOING :)  Work on lifestyle food changes and exercise.  Please continue to practice social distancing to keep you, your family, and our community safe.  If you must go out, please wear a mask and practice good handwashing.  It was a pleasure to see you and I look forward to continuing to work together on your health and well-being. Please do not hesitate to call the office if you need care or have questions about your care.  Have a wonderful day and week. With Gratitude, Tereasa Coop, DNP, AGNP-BC

## 2019-12-22 ENCOUNTER — Encounter: Payer: Self-pay | Admitting: Family Medicine

## 2020-02-14 ENCOUNTER — Other Ambulatory Visit: Payer: Self-pay | Admitting: *Deleted

## 2020-02-14 DIAGNOSIS — E119 Type 2 diabetes mellitus without complications: Secondary | ICD-10-CM

## 2020-02-14 MED ORDER — INSULIN NPH ISOPHANE & REGULAR (70-30) 100 UNIT/ML ~~LOC~~ SUSP: 20.0000 [IU] | Freq: Two times a day (BID) | SUBCUTANEOUS | 1 refills | Status: DC

## 2020-02-14 MED ORDER — METFORMIN HCL 1000 MG PO TABS: 1000.0000 mg | ORAL_TABLET | Freq: Two times a day (BID) | ORAL | 1 refills | Status: DC

## 2020-03-29 ENCOUNTER — Encounter (HOSPITAL_COMMUNITY): Payer: Self-pay

## 2020-03-29 ENCOUNTER — Other Ambulatory Visit: Payer: Self-pay

## 2020-03-29 ENCOUNTER — Observation Stay (HOSPITAL_COMMUNITY)
Admission: EM | Admit: 2020-03-29 | Discharge: 2020-03-30 | Disposition: A | Discharge status: Home / Self Care | Payer: Self-pay | Attending: Family Medicine | Admitting: Family Medicine

## 2020-03-29 ENCOUNTER — Ambulatory Visit
Admission: EM | Admit: 2020-03-29 | Discharge: 2020-03-29 | Disposition: A | Discharge status: Home / Self Care | Payer: Self-pay

## 2020-03-29 ENCOUNTER — Emergency Department (HOSPITAL_COMMUNITY): Payer: Self-pay

## 2020-03-29 DIAGNOSIS — Z794 Long term (current) use of insulin: Secondary | ICD-10-CM | POA: Insufficient documentation

## 2020-03-29 DIAGNOSIS — H8110 Benign paroxysmal vertigo, unspecified ear: Secondary | ICD-10-CM | POA: Insufficient documentation

## 2020-03-29 DIAGNOSIS — Z20822 Contact with and (suspected) exposure to covid-19: Secondary | ICD-10-CM | POA: Insufficient documentation

## 2020-03-29 DIAGNOSIS — Z885 Allergy status to narcotic agent status: Secondary | ICD-10-CM | POA: Insufficient documentation

## 2020-03-29 DIAGNOSIS — R519 Headache, unspecified: Secondary | ICD-10-CM | POA: Insufficient documentation

## 2020-03-29 DIAGNOSIS — R112 Nausea with vomiting, unspecified: Secondary | ICD-10-CM

## 2020-03-29 DIAGNOSIS — E119 Type 2 diabetes mellitus without complications: Secondary | ICD-10-CM | POA: Insufficient documentation

## 2020-03-29 DIAGNOSIS — Z79899 Other long term (current) drug therapy: Secondary | ICD-10-CM | POA: Insufficient documentation

## 2020-03-29 DIAGNOSIS — F17210 Nicotine dependence, cigarettes, uncomplicated: Secondary | ICD-10-CM | POA: Insufficient documentation

## 2020-03-29 DIAGNOSIS — Z882 Allergy status to sulfonamides status: Secondary | ICD-10-CM | POA: Insufficient documentation

## 2020-03-29 DIAGNOSIS — K219 Gastro-esophageal reflux disease without esophagitis: Principal | ICD-10-CM | POA: Insufficient documentation

## 2020-03-29 DIAGNOSIS — R42 Dizziness and giddiness: Secondary | ICD-10-CM

## 2020-03-29 DIAGNOSIS — R109 Unspecified abdominal pain: Secondary | ICD-10-CM

## 2020-03-29 LAB — GLUCOSE, CAPILLARY: Glucose-Capillary: 112 mg/dL — ABNORMAL HIGH (ref 70–99)

## 2020-03-29 LAB — CBC WITH DIFFERENTIAL/PLATELET
Abs Immature Granulocytes: 0.01 10*3/uL (ref 0.00–0.07)
Basophils Absolute: 0 10*3/uL (ref 0.0–0.1)
Basophils Relative: 1 %
Eosinophils Absolute: 0.2 10*3/uL (ref 0.0–0.5)
Eosinophils Relative: 3 %
HCT: 41.2 % (ref 36.0–46.0)
Hemoglobin: 13.9 g/dL (ref 12.0–15.0)
Immature Granulocytes: 0 %
Lymphocytes Relative: 38 %
Lymphs Abs: 3.1 10*3/uL (ref 0.7–4.0)
MCH: 31.5 pg (ref 26.0–34.0)
MCHC: 33.7 g/dL (ref 30.0–36.0)
MCV: 93.4 fL (ref 80.0–100.0)
Monocytes Absolute: 0.7 10*3/uL (ref 0.1–1.0)
Monocytes Relative: 9 %
Neutro Abs: 4.1 10*3/uL (ref 1.7–7.7)
Neutrophils Relative %: 49 %
Platelets: 275 10*3/uL (ref 150–400)
RBC: 4.41 MIL/uL (ref 3.87–5.11)
RDW: 13.2 % (ref 11.5–15.5)
WBC: 8.1 10*3/uL (ref 4.0–10.5)
nRBC: 0 % (ref 0.0–0.2)

## 2020-03-29 LAB — COMPREHENSIVE METABOLIC PANEL
ALT: 68 U/L — ABNORMAL HIGH (ref 0–44)
AST: 45 U/L — ABNORMAL HIGH (ref 15–41)
Albumin: 4.2 g/dL (ref 3.5–5.0)
Alkaline Phosphatase: 68 U/L (ref 38–126)
Anion gap: 13 (ref 5–15)
BUN: 13 mg/dL (ref 6–20)
CO2: 23 mmol/L (ref 22–32)
Calcium: 9.1 mg/dL (ref 8.9–10.3)
Chloride: 101 mmol/L (ref 98–111)
Creatinine, Ser: 0.65 mg/dL (ref 0.44–1.00)
GFR calc Af Amer: >60 mL/min (ref >60)
GFR calc non Af Amer: >60 mL/min (ref >60)
Glucose, Bld: 179 mg/dL — ABNORMAL HIGH (ref 70–99)
Potassium: 3.3 mmol/L — ABNORMAL LOW (ref 3.5–5.1)
Sodium: 137 mmol/L (ref 135–145)
Total Bilirubin: 0.6 mg/dL (ref 0.3–1.2)
Total Protein: 7.5 g/dL (ref 6.5–8.1)

## 2020-03-29 LAB — URINALYSIS, ROUTINE W REFLEX MICROSCOPIC
Bilirubin Urine: NEGATIVE
Glucose, UA: NEGATIVE mg/dL
Hgb urine dipstick: NEGATIVE
Ketones, ur: NEGATIVE mg/dL
Leukocytes,Ua: NEGATIVE
Nitrite: NEGATIVE
Protein, ur: NEGATIVE mg/dL
Specific Gravity, Urine: 1.014 (ref 1.005–1.030)
pH: 5 (ref 5.0–8.0)

## 2020-03-29 LAB — POCT FASTING CBG KUC MANUAL ENTRY: POCT Glucose (KUC): 254 mg/dL — AB (ref 70–99)

## 2020-03-29 LAB — POCT URINALYSIS DIP (MANUAL ENTRY)
Bilirubin, UA: NEGATIVE
Blood, UA: NEGATIVE
Glucose, UA: 100 mg/dL — AB
Ketones, POC UA: NEGATIVE mg/dL
Leukocytes, UA: NEGATIVE
Nitrite, UA: NEGATIVE
Protein Ur, POC: NEGATIVE mg/dL
Spec Grav, UA: >=1.03 — AB (ref 1.010–1.025)
Urobilinogen, UA: 0.2 E.U./dL
pH, UA: 5 (ref 5.0–8.0)

## 2020-03-29 LAB — CBG MONITORING, ED: Glucose-Capillary: 187 mg/dL — ABNORMAL HIGH (ref 70–99)

## 2020-03-29 MED ORDER — MECLIZINE HCL 12.5 MG PO TABS: 25.0000 mg | ORAL_TABLET | Freq: Once | ORAL | Status: DC

## 2020-03-29 MED ORDER — INSULIN ASPART PROT & ASPART (70-30 MIX) 100 UNIT/ML ~~LOC~~ SUSP
14.0000 [IU] | Freq: Two times a day (BID) | SUBCUTANEOUS | Status: DC
  Filled 2020-03-29: qty 10

## 2020-03-29 MED ORDER — DIAZEPAM 2 MG PO TABS
2.0000 mg | ORAL_TABLET | Freq: Once | ORAL | Status: DC
  Filled 2020-03-29: qty 1

## 2020-03-29 MED ORDER — DIAZEPAM 2 MG PO TABS: 2.0000 mg | ORAL_TABLET | Freq: Two times a day (BID) | ORAL | Status: DC | PRN

## 2020-03-29 MED ORDER — SODIUM CHLORIDE 0.9 % IV BOLUS
1000.0000 mL | Freq: Once | INTRAVENOUS | Status: AC
  Administered 2020-03-29: 18:00:00 1000 mL via INTRAVENOUS

## 2020-03-29 MED ORDER — PANTOPRAZOLE SODIUM 40 MG PO TBEC
40.0000 mg | DELAYED_RELEASE_TABLET | Freq: Every day | ORAL | Status: DC
  Administered 2020-03-30: 40 mg via ORAL
  Filled 2020-03-29: qty 1

## 2020-03-29 MED ORDER — MECLIZINE HCL 12.5 MG PO TABS
25.0000 mg | ORAL_TABLET | Freq: Three times a day (TID) | ORAL | Status: DC
  Administered 2020-03-29 – 2020-03-30 (×2): 25 mg via ORAL
  Filled 2020-03-29 (×2): qty 2

## 2020-03-29 MED ORDER — METOCLOPRAMIDE HCL 5 MG/ML IJ SOLN
5.0000 mg | Freq: Once | INTRAMUSCULAR | Status: AC
  Administered 2020-03-29: 18:00:00 5 mg via INTRAVENOUS
  Filled 2020-03-29: qty 2

## 2020-03-29 MED ORDER — DIPHENHYDRAMINE HCL 50 MG/ML IJ SOLN
25.0000 mg | Freq: Once | INTRAMUSCULAR | Status: AC
  Administered 2020-03-29: 25 mg via INTRAVENOUS
  Filled 2020-03-29: qty 1

## 2020-03-29 MED ORDER — DIAZEPAM 5 MG PO TABS: 5.0000 mg | ORAL_TABLET | Freq: Once | ORAL | Status: DC

## 2020-03-29 MED ORDER — MECLIZINE HCL 12.5 MG PO TABS
25.0000 mg | ORAL_TABLET | Freq: Once | ORAL | Status: AC
  Administered 2020-03-29: 25 mg via ORAL
  Filled 2020-03-29: qty 2

## 2020-03-29 MED ORDER — BUTALBITAL-APAP-CAFFEINE 50-325-40 MG PO TABS
2.0000 | ORAL_TABLET | Freq: Four times a day (QID) | ORAL | Status: DC | PRN
  Administered 2020-03-29: 2 via ORAL
  Filled 2020-03-29: qty 2

## 2020-03-29 MED ORDER — HEPARIN SODIUM (PORCINE) 5000 UNIT/ML IJ SOLN
5000.0000 [IU] | Freq: Three times a day (TID) | INTRAMUSCULAR | Status: DC
  Administered 2020-03-29 – 2020-03-30 (×2): 5000 [IU] via SUBCUTANEOUS
  Filled 2020-03-29 (×2): qty 1

## 2020-03-29 MED ORDER — SODIUM CHLORIDE 0.9 % IV SOLN: INTRAVENOUS | Status: DC

## 2020-03-29 MED ORDER — ASPIRIN 81 MG PO CHEW
324.0000 mg | CHEWABLE_TABLET | Freq: Once | ORAL | Status: AC
  Administered 2020-03-29: 22:00:00 324 mg via ORAL
  Filled 2020-03-29: qty 4

## 2020-03-29 NOTE — ED Notes (Signed)
Pt states that they are too dizzy to complete orthostatic vitals, reports some dizziness when sitting and unable to stand for completion of vs

## 2020-03-29 NOTE — ED Notes (Signed)
Trouble attaining IV access - charge nurse notified.

## 2020-03-29 NOTE — ED Triage Notes (Signed)
Sudden onset of dizziness while at [facility] on 04/11.  Pt reports blurred vision and feeling anxious at that time.  Pt felt that it was r/t her diabetes.  Had a drink and candy then felt better about 20 minutes later.   Pt felt dizzy again today, checked sugar at 248.  Describes the dizziness as the room is spinning as if she had too much to drink.  Reports seeing white dots.   Pt reports R sided chest pain but states she feel it is gas.   Pt reports R flank pain starting nearly one week ago.  Urine is dark and odor.  Denies frequency.  States she has to tell herself to go to bathroom - only voided once yesterday.

## 2020-03-29 NOTE — ED Provider Notes (Addendum)
[facility]    CSN: [id number] Arrival date & time: 03/29/20  1600      History   Chief Complaint Chief Complaint  Patient presents with   Dizziness    HPI Alexandria Mejia is a 49 y.o. female.   With history of diabetes  presented to the urgent care with a complaint of dizziness, nausea for the past 3 days and right flank pain for the past week.  Denies a precipitating event, trauma, or recent URI within the past month.  Report dizziness symptom again today.  Describes the dizziness as "the room spinning".  States that it is constant  with episodes lasting 45 minutes.  Has not tried any medication.  Symptoms made worse with movement.  Admits to previous symptoms that self improved.  Patient states yesterday he has decreased urine output as she only void 1 time/day.  States she was drinking plenty of water.  Denies fever, chills,  vomiting, hearing changes, , ear pain, chest pain, syncope, SOB, weakness, slurred speech, memory or emotional changes, facial drooping, asymmetry, incoordination, numbness or tingling, abdominal pain, changes in bowel or bladder habits.   The history is provided by the patient. No language interpreter was used.  Dizziness Associated symptoms: nausea     Past Medical History:  Diagnosis Date   Back pain    Candidiasis, intertrigo 09/17/2012   Complication of anesthesia    patient states "with last surgery I was hard to wake up".   Diabetes mellitus without complication (HCC)    DKA (diabetic ketoacidoses) (HCC)    DKA (diabetic ketoacidoses) (HCC) 05/28/2019   GERD (gastroesophageal reflux disease)    H/O repair of right rotator cuff 10/24/2014   Hypotension    Migraine    Partial tear of rotator cuff 03/01/2014   Rotator cuff tear, right 02/14/2014   Shoulder injury 02/14/2014   Shoulder pain 09/17/2012    Patient Active Problem List   Diagnosis Date Noted   Varicose veins of left leg with edema 09/17/2019   Type 2  diabetes mellitus without complication (HCC) 07/20/2019   Pain of left lower extremity 07/20/2019   Abdominal wall abscess 05/29/2019   GERD (gastroesophageal reflux disease)    Cellulitis    Leukocytosis    Stress 09/17/2012   Class 1 obesity due to excess calories with serious comorbidity and body mass index (BMI) of 32.0 to 32.9 in adult 08/04/2012   Chronic back pain 08/04/2012   Nicotine abuse 08/04/2012    Past Surgical History:  Procedure Laterality Date   APPENDECTOMY     BACK SURGERY     lumbar-herniated disc   CHOLECYSTECTOMY     FLEXOR TENOTOMY Right 10/12/2014   Procedure: BICEPS TENOTOMY;  Surgeon: Vickki Hearing, MD;  Location: [facility];  Service: Orthopedics;  Laterality: Right;   NECK SURGERY     decompression of 7 disc   SHOULDER ARTHROSCOPY WITH ROTATOR CUFF REPAIR Right 05/27/2014   Procedure: SHOULDER ARTHROSCOPY WITH ROTATOR CUFF REPAIR, subscalpularis repair, open supraspinatus repair;  Surgeon: Vickki Hearing, MD;  Location: [facility];  Service: Orthopedics;  Laterality: Right;   SHOULDER OPEN ROTATOR CUFF REPAIR Right 10/12/2014   Procedure: OPEN ROTATOR CUFF REPAIR SHOULDER;  Surgeon: Vickki Hearing, MD;  Location: [facility];  Service: Orthopedics;  Laterality: Right;   ULNAR NERVE TRANSPOSITION Right 11/13/2015   Procedure: RIGHT ULNAR NERVE TRANSPOSITION;  Surgeon: Mack Hook, MD;  Location: [facility];  Service: Orthopedics;  Laterality: Right;  OB History   No obstetric history on file.      Home Medications    Prior to Admission medications   Medication Sig Start Date End Date Taking? Authorizing Provider  Cholecalciferol (VITAMIN D) 50 MCG (2000 UT) tablet Take 1 tablet (2,000 Units total) by mouth daily. 05/29/19  Yes Johnson, Clanford L, MD  HYDROcodone-acetaminophen (NORCO/VICODIN) 5-325 MG tablet Take 1 tablet by mouth every 4 (four) hours as needed. 10/10/19  Yes Ivery Quale, PA-C  ibuprofen  (ADVIL) 800 MG tablet Take 800 mg by mouth every 8 (eight) hours as needed for moderate pain.   Yes [provider]  insulin aspart protamine - aspart (NOVOLOG MIX 70/30 FLEXPEN) (70-30) 100 UNIT/ML FlexPen Inject into the skin 2 (two) times daily.   Yes [provider]  metFORMIN (GLUCOPHAGE) 1000 MG tablet Take 1 tablet (1,000 mg total) by mouth 2 (two) times daily with a meal. 02/14/20  Yes Freddy Finner, NP  omeprazole (PRILOSEC) 20 MG capsule Take 20 mg by mouth daily.   Yes [provider]  cyclobenzaprine (FLEXERIL) 10 MG tablet Take 1 tablet (10 mg total) by mouth 3 (three) times daily. 10/10/19   Ivery Quale, PA-C  insulin NPH-regular Human (70-30) 100 UNIT/ML injection Inject 20 Units into the skin 2 (two) times daily with a meal. OR AS DIRECTED.  REDUCE DOSE BY 5 UNITS IF YOU HAVE ANY BLOOD SUGAR LESS THAN 100. 02/14/20   Freddy Finner, NP    Family History Family History  Problem Relation Age of Onset   COPD Mother    Arthritis Mother    Diabetes Father    Heart disease Father    Hypertension Brother    Diabetes Paternal Grandmother    Diabetes Maternal Grandmother    Dementia Paternal Grandfather     Social History Social History   Tobacco Use   Smoking status: Current Some Day Smoker    Packs/day: 0.20    Years: 30.00    Pack years: 6.00    Types: Cigarettes   Smokeless tobacco: Never Used  Substance Use Topics   Alcohol use: Yes    Comment: occ   Drug use: No     Allergies   Codeine and Sulfa antibiotics   Review of Systems Review of Systems  Constitutional: Negative.   Respiratory: Negative.   Cardiovascular: Negative.   Gastrointestinal: Positive for nausea.  Genitourinary: Positive for flank pain.       Decreased urine output  Neurological: Positive for dizziness.  All other systems reviewed and are negative.    Physical Exam Triage Vital Signs ED Triage Vitals  Enc Vitals Group     BP      Pulse        Resp      Temp      Temp src      SpO2      Weight      Height      Head Circumference      Peak Flow      Pain Score      Pain Loc      Pain Edu?      Excl. in GC?    No data found.  Updated Vital Signs BP 120/88 (BP Location: Right Arm)    Pulse 81    Temp 97.9 F (36.6 C) (Oral)    Resp 18    SpO2 95%   Visual Acuity Right Eye Distance:   Left Eye Distance:  Bilateral Distance:    Right Eye Near:   Left Eye Near:    Bilateral Near:     Physical Exam Vitals and nursing note reviewed.  Constitutional:      General: She is not in acute distress.    Appearance: Normal appearance. She is normal weight. She is not ill-appearing, toxic-appearing or diaphoretic.  HENT:     Head: Normocephalic.     Right Ear: Tympanic membrane, ear canal and external ear normal. There is no impacted cerumen.     Left Ear: Tympanic membrane, ear canal and external ear normal. There is no impacted cerumen.  Cardiovascular:     Rate and Rhythm: Normal rate and regular rhythm.     Pulses: Normal pulses.     Heart sounds: Normal heart sounds. No murmur. No friction rub. No gallop.   Pulmonary:     Effort: Pulmonary effort is normal. No respiratory distress.     Breath sounds: Normal breath sounds. No stridor. No wheezing, rhonchi or rales.  Chest:     Chest wall: No tenderness.  Abdominal:     General: Abdomen is flat. Bowel sounds are normal. There is no distension.     Palpations: There is no mass.     Tenderness: There is no right CVA tenderness or left CVA tenderness.  Musculoskeletal:     Thoracic back: Tenderness present.  Neurological:     General: No focal deficit present.     Mental Status: She is alert and oriented to person, place, and time.     Cranial Nerves: Cranial nerves are intact. No cranial nerve deficit.     Sensory: Sensation is intact. No sensory deficit.     Motor: Motor function is intact. No weakness.     Coordination: Coordination is intact. Coordination  normal.     Gait: Gait is intact. Gait normal.     Deep Tendon Reflexes: Reflexes normal.      UC Treatments / Results  Labs (all labs ordered are listed, but only abnormal results are displayed) Labs Reviewed  POCT FASTING CBG [facility] MANUAL ENTRY - Abnormal; Notable for the following components:      Result Value   POCT Glucose ([facility]) 254 (*)    All other components within normal limits  POCT URINALYSIS DIP (MANUAL ENTRY) - Abnormal; Notable for the following components:   Color, UA straw (*)    Glucose, UA =100 (*)    Spec Grav, UA >=1.030 (*)    All other components within normal limits  URINE CULTURE    EKG   Radiology No results found.  Procedures Procedures (including critical care time)  Medications Ordered in UC Medications - No data to display  Initial Impression / Assessment and Plan / UC Course  I have reviewed the triage vital signs and the nursing notes.  Pertinent labs & imaging results that were available during my care of the patient were reviewed by me and considered in my medical decision making (see chart for details).  Clinical Course as of Mar 29 1809  Wed Mar 29, 2020  1640 Urine Culture [KA]    Clinical Course User Index [KA] Emerson Monte, FNP   Patient is discharged to go to ED for further evaluation.  POCT CBGs was 254.  POCT urine analysis was inconclusive for UTI.  EKG showed normal sinus rhythm with possible left atrial enlargement.  Neuro assessment was otherwise normal except for dizziness.  Further work-up may be needed  Final Clinical  Impressions(s) / UC Diagnoses   Final diagnoses:  Dizziness  Flank pain  Nausea and vomiting, intractability of vomiting not specified, unspecified vomiting type     Discharge Instructions     Patient was discharged to go to ED for further evaluation    ED Prescriptions    None     PDMP not reviewed this encounter.       Durward Parcel, FNP 03/29/20 1845

## 2020-03-29 NOTE — Discharge Instructions (Signed)
Patient was discharged to go to ED for further evaluation 

## 2020-03-29 NOTE — ED Triage Notes (Addendum)
Pt c/o sudden onset of dizziness while eating lunch.  Pt checked CBG and reported it was 248. Pt took Metformin 1000 mg and insulin 15 units 70/30.   Pt states dizziness episode has lasted for 2 hours without relief. Pt also reports headache and seeing little white lights. Pt states "I feel drunk".  Hx of same.

## 2020-03-29 NOTE — ED Notes (Signed)
Pt in bed, pt states that her dizziness is a little better, states that her headache is starting to come back reports 4/10 pain, states that she doesn't need anything for pain at this time, call bell within reach

## 2020-03-29 NOTE — H&P (Signed)
TRH H&P   Patient Demographics:    Alexandria Mejia, is a 49 y.o. female  MRN: [id number]   DOB - 01-30-1971  Admit Date - 03/29/2020  Outpatient Primary MD for the patient is Perlie Mayo, NP  Referring MD/NP/PA: [place]  Patient coming from: home  Chief Complaint  Patient presents with  . Dizziness      HPI:    Alexandria Mejia  is a 49 y.o. female, past medical history significant for diabetes mellitus, DKA, migraines, presents to ED secondary to complaints of dizziness, patient reports her symptoms started today, while eating lunch, described by feeling as the room is spinning, as well she does report nausea and vomiting, initially she thought it secondary to low blood sugar, but her glucose were in the lower 200s, went to urgent care, she was sent to ED for further evaluation, as well she does report some headache, feels like her migraine, she does report similar episodes of vertigo in the past, but it was not accompanied by headache, she denies any focal deficits, tingling or numbness, no slurred speech or altered mental status. -In ED CT head with no acute findings, CBG is controlled, no significant lab abnormalities, she did receive Benadryl, meclizine and IV Reglan, with no improvement of symptoms, so triage hospitalist requested to admit for further evaluation.    Review of systems:    In addition to the HPI above,  No Fever-chills, No Headache, No changes with Vision or hearing, No problems swallowing food or Liquids, No Chest pain, Cough or Shortness of Breath, No Abdominal pain .reports nausea and vomiting accompanying her vertigo No Blood in stool or Urine, No dysuria, No new skin rashes or bruises, No new joints pains-aches,  No new weakness, tingling, numbness in any extremity, he does report vertigo, as well reports headache No recent weight gain or  loss, No polyuria, polydypsia or polyphagia, No significant Mental Stressors.  A full 10 point Review of Systems was done, except as stated above, all other Review of Systems were negative.   With Past History of the following :    Past Medical History:  Diagnosis Date  . Back pain   . Candidiasis, intertrigo 09/17/2012  . Complication of anesthesia    patient states "with last surgery I was hard to wake up".  . Diabetes mellitus without complication ([place])   . DKA (diabetic ketoacidoses) ([place])   . DKA (diabetic ketoacidoses) ([place]) 05/28/2019  . GERD (gastroesophageal reflux disease)   . H/O repair of right rotator cuff 10/24/2014  . Hypotension   . Migraine   . Partial tear of rotator cuff 03/01/2014  . Rotator cuff tear, right 02/14/2014  . Shoulder injury 02/14/2014  . Shoulder pain 09/17/2012      Past Surgical History:  Procedure Laterality Date  . APPENDECTOMY    . BACK SURGERY     lumbar-herniated disc  .  CHOLECYSTECTOMY    . FLEXOR TENOTOMY Right 10/12/2014   Procedure: BICEPS TENOTOMY;  Surgeon: Vickki Hearing, MD;  Location: [facility];  Service: Orthopedics;  Laterality: Right;  . NECK SURGERY     decompression of 7 disc  . SHOULDER ARTHROSCOPY WITH ROTATOR CUFF REPAIR Right 05/27/2014   Procedure: SHOULDER ARTHROSCOPY WITH ROTATOR CUFF REPAIR, subscalpularis repair, open supraspinatus repair;  Surgeon: Vickki Hearing, MD;  Location: [facility];  Service: Orthopedics;  Laterality: Right;  . SHOULDER OPEN ROTATOR CUFF REPAIR Right 10/12/2014   Procedure: OPEN ROTATOR CUFF REPAIR SHOULDER;  Surgeon: Vickki Hearing, MD;  Location: [facility];  Service: Orthopedics;  Laterality: Right;  . ULNAR NERVE TRANSPOSITION Right 11/13/2015   Procedure: RIGHT ULNAR NERVE TRANSPOSITION;  Surgeon: Mack Hook, MD;  Location: [facility];  Service: Orthopedics;  Laterality: Right;      Social History:     Social History   Tobacco Use  . Smoking status: Current  Some Day Smoker    Packs/day: 0.20    Years: 30.00    Pack years: 6.00    Types: Cigarettes  . Smokeless tobacco: Never Used  Substance Use Topics  . Alcohol use: Yes    Comment: occ        Family History :     Family History  Problem Relation Age of Onset  . COPD Mother   . Arthritis Mother   . Diabetes Father   . Heart disease Father   . Hypertension Brother   . Diabetes Paternal Grandmother   . Diabetes Maternal Grandmother   . Dementia Paternal Grandfather       Home Medications:   Prior to Admission medications   Medication Sig Start Date End Date Taking? Authorizing Provider  ibuprofen (ADVIL) 200 MG tablet Take 800 mg by mouth every 8 (eight) hours as needed for moderate pain.    Yes [provider]  insulin NPH-regular Human (70-30) 100 UNIT/ML injection Inject 20 Units into the skin 2 (two) times daily with a meal. OR AS DIRECTED.  REDUCE DOSE BY 5 UNITS IF YOU HAVE ANY BLOOD SUGAR LESS THAN 100. Patient taking differently: Inject 15 Units into the skin 2 (two) times daily with a meal.  02/14/20  Yes Freddy Finner, NP  metFORMIN (GLUCOPHAGE) 1000 MG tablet Take 1 tablet (1,000 mg total) by mouth 2 (two) times daily with a meal. 02/14/20  Yes Freddy Finner, NP  omeprazole (PRILOSEC) 20 MG capsule Take 20 mg by mouth daily.   Yes [provider]     Allergies:     Allergies  Allergen Reactions  . Codeine Itching  . Sulfa Antibiotics Itching and Swelling     Physical Exam:   Vitals  Blood pressure 115/76, pulse 72, temperature 97.6 F (36.4 C), temperature source Oral, resp. rate 16, height 5\' 9"  (1.753 m), weight 98.4 kg, last menstrual period 01/30/2020, SpO2 98 %.   1. General developed female, laying in bed in no apparent distress.  2. Normal affect and insight, Not Suicidal or Homicidal, Awake Alert, Oriented X 3.  3. No F.N deficits, ALL C.Nerves Intact, Strength 5/5 all 4 extremities, Sensation intact all 4 extremities,  Plantars down going.  He does have horizontal nystagmus to the right.  4. Ears and Eyes appear Normal, Conjunctivae clear, PERRLA. Moist Oral Mucosa.  5. Supple Neck, No JVD, No cervical lymphadenopathy appriciated, No Carotid Bruits.  6. Symmetrical Chest wall movement, Good air movement bilaterally, CTAB.  7. RRR, No Gallops, Rubs or Murmurs, No Parasternal Heave.  8. Positive Bowel Sounds, Abdomen Soft, No tenderness, No organomegaly appriciated,No rebound -guarding or rigidity.  9.  No Cyanosis, Normal Skin Turgor, No Skin Rash or Bruise.  10. Good muscle tone,  joints appear normal , no effusions, Normal ROM.  11. No Palpable Lymph Nodes in Neck or Axillae    Data Review:    CBC Recent Labs  Lab 03/29/20 1745  WBC 8.1  HGB 13.9  HCT 41.2  PLT 275  MCV 93.4  MCH 31.5  MCHC 33.7  RDW 13.2  LYMPHSABS 3.1  MONOABS 0.7  EOSABS 0.2  BASOSABS 0.0   ------------------------------------------------------------------------------------------------------------------  Chemistries  Recent Labs  Lab 03/29/20 1745  NA 137  K 3.3*  CL 101  CO2 23  GLUCOSE 179*  BUN 13  CREATININE 0.65  CALCIUM 9.1  AST 45*  ALT 68*  ALKPHOS 68  BILITOT 0.6   ------------------------------------------------------------------------------------------------------------------ estimated creatinine clearance is 107.4 mL/min (by C-G formula based on SCr of 0.65 mg/dL). ------------------------------------------------------------------------------------------------------------------ No results for input(s): TSH, T4TOTAL, T3FREE, THYROIDAB in the last 72 hours.  Invalid input(s): FREET3  Coagulation profile No results for input(s): INR, PROTIME in the last 168 hours. ------------------------------------------------------------------------------------------------------------------- No results for input(s): DDIMER in the last 72  hours. -------------------------------------------------------------------------------------------------------------------  Cardiac Enzymes No results for input(s): CKMB, TROPONINI, MYOGLOBIN in the last 168 hours.  Invalid input(s): CK ------------------------------------------------------------------------------------------------------------------ No results found for: BNP   ---------------------------------------------------------------------------------------------------------------  Urinalysis    Component Value Date/Time   COLORURINE YELLOW 10/10/2019 1830   APPEARANCEUR HAZY (A) 10/10/2019 1830   LABSPEC 1.027 10/10/2019 1830   PHURINE 5.0 10/10/2019 1830   GLUCOSEU NEGATIVE 10/10/2019 1830   HGBUR NEGATIVE 10/10/2019 1830   BILIRUBINUR negative 03/29/2020 1625   KETONESUR negative 03/29/2020 1625   KETONESUR NEGATIVE 10/10/2019 1830   PROTEINUR negative 03/29/2020 1625   PROTEINUR NEGATIVE 10/10/2019 1830   UROBILINOGEN 0.2 03/29/2020 1625   UROBILINOGEN 0.2 09/06/2013 1950   NITRITE Negative 03/29/2020 1625   NITRITE NEGATIVE 10/10/2019 1830   LEUKOCYTESUR Negative 03/29/2020 1625   LEUKOCYTESUR SMALL (A) 10/10/2019 1830    ----------------------------------------------------------------------------------------------------------------   Imaging Results:    CT Head Wo Contrast  Result Date: 03/29/2020 CLINICAL DATA:  Room spinning dizziness; dizziness, nonspecific. EXAM: CT HEAD WITHOUT CONTRAST TECHNIQUE: Contiguous axial images were obtained from the base of the skull through the vertex without intravenous contrast. COMPARISON:  Head CT 10/10/2019. FINDINGS: Brain: There is no evidence of acute intracranial hemorrhage, intracranial mass, midline shift or extra-axial fluid collection.No demarcated cortical infarction. Cerebral volume is normal for age. Vascular: No hyperdense vessel. Skull: Normal. Negative for fracture or focal lesion. Sinuses/Orbits: Visualized  orbits demonstrate no acute abnormality. Partially imaged moderate-sized right maxillary sinus mucous retention cyst. No significant mastoid effusion. IMPRESSION: No evidence of acute intracranial abnormality. Right maxillary sinus mucous retention cyst. Electronically Signed   By: Jackey Loge DO   On: 03/29/2020 19:54    My personal review of EKG: Rhythm NSR, Rate  66 /min, QTc 430 .   Assessment & Plan:    Active Problems:   GERD (gastroesophageal reflux disease)   Diabetes mellitus (HCC)   Vertigo   Vertigo -This appears to be benign positional vertigo, provoked by head turning, standing up and activity, have right horizontal nystagmus, will consult vestibular PT in a.m., will continue scheduled meclizine, will give 1 dose of Valium and continue as needed, given Reglan and Benadryl in ED. -Will check orthostasis. -Will  start on IV fluids. -Will obtain MRI brain to rule out CVA, she received full dose aspirin in ED. -Will monitor on telemetry. -Will start her on Fioricet for headache  Diabetes mellitus. -Continue with home regimen insulin 70/30, but will start at a lower dose 14 units twice daily, will monitor CBG closely, will hold Metformin during hospital stay.  GERD -Continue with PPI   DVT Prophylaxis  Heparin   AM Labs Ordered, also please review Full Orders  Family Communication: Admission, patients condition and plan of care including tests being ordered have been discussed with the patient who indicate understanding and agree with the plan and Code Status.  Code Status Full  Likely DC to Home  Condition GUARDED    Consults called: None    Admission status: Observation  Time spent in minutes : 45 minutes   Huey Bienenstock M.D on 03/29/2020 at 9:53 PM  Between 7am to 7pm - Pager - [phone]. After 7pm go to [email or web address] - password [facility]  [facility] - Office  [phone]

## 2020-03-29 NOTE — ED Notes (Signed)
Pt in bed, pt reports that her headache is a little worse, reports 6/10, states that she doesn't need anything before she goes upstairs, states that it might be the light, lights in the room dimmed for pt comfort.

## 2020-03-29 NOTE — ED Provider Notes (Signed)
[facility] EMERGENCY DEPARTMENT Provider Note   CSN: [id number] Arrival date & time: 03/29/20  1702     History Chief Complaint  Patient presents with  . Dizziness    Alexandria Mejia is a 49 y.o. female with past medical history significant for hypotension, DKA, migraines presents to emergency department today with chief complaint of dizziness. She states she was out to lunch eating a fajita when she began to feel dizzy. She describes it as feeling like the room is spinning. She thought her blood sugar might be low so she checked it and it was in the 200s. She took insulin and continued to feel dizzy so she went to urgent care. She states the dizziness has been constant. She is also reporting a headache that feels similar to her migraines.  She did have a similar episode x 4 days ago when she thought her blood sugar might be low causing her dizziness, but she did not have her glucometer to check her blood sugar so she ate a reese's cups and the dizziness resolved after 15 minutes. No medications for her symptoms prior to arrival.   She denies any head injury, fall or trauma.     Past Medical History:  Diagnosis Date  . Back pain   . Candidiasis, intertrigo 09/17/2012  . Complication of anesthesia    patient states "with last surgery I was hard to wake up".  . Diabetes mellitus without complication (HCC)   . DKA (diabetic ketoacidoses) (HCC)   . DKA (diabetic ketoacidoses) (HCC) 05/28/2019  . GERD (gastroesophageal reflux disease)   . H/O repair of right rotator cuff 10/24/2014  . Hypotension   . Migraine   . Partial tear of rotator cuff 03/01/2014  . Rotator cuff tear, right 02/14/2014  . Shoulder injury 02/14/2014  . Shoulder pain 09/17/2012    Patient Active Problem List   Diagnosis Date Noted  . Varicose veins of left leg with edema 09/17/2019  . Type 2 diabetes mellitus without complication (HCC) 07/20/2019  . Pain of left lower extremity 07/20/2019  . Abdominal wall  abscess 05/29/2019  . GERD (gastroesophageal reflux disease)   . Cellulitis   . Leukocytosis   . Stress 09/17/2012  . Class 1 obesity due to excess calories with serious comorbidity and body mass index (BMI) of 32.0 to 32.9 in adult 08/04/2012  . Chronic back pain 08/04/2012  . Nicotine abuse 08/04/2012    Past Surgical History:  Procedure Laterality Date  . APPENDECTOMY    . BACK SURGERY     lumbar-herniated disc  . CHOLECYSTECTOMY    . FLEXOR TENOTOMY Right 10/12/2014   Procedure: BICEPS TENOTOMY;  Surgeon: Vickki Hearing, MD;  Location: [facility];  Service: Orthopedics;  Laterality: Right;  . NECK SURGERY     decompression of 7 disc  . SHOULDER ARTHROSCOPY WITH ROTATOR CUFF REPAIR Right 05/27/2014   Procedure: SHOULDER ARTHROSCOPY WITH ROTATOR CUFF REPAIR, subscalpularis repair, open supraspinatus repair;  Surgeon: Vickki Hearing, MD;  Location: [facility];  Service: Orthopedics;  Laterality: Right;  . SHOULDER OPEN ROTATOR CUFF REPAIR Right 10/12/2014   Procedure: OPEN ROTATOR CUFF REPAIR SHOULDER;  Surgeon: Vickki Hearing, MD;  Location: [facility];  Service: Orthopedics;  Laterality: Right;  . ULNAR NERVE TRANSPOSITION Right 11/13/2015   Procedure: RIGHT ULNAR NERVE TRANSPOSITION;  Surgeon: Mack Hook, MD;  Location: [facility];  Service: Orthopedics;  Laterality: Right;     OB History   No obstetric history on  file.     Family History  Problem Relation Age of Onset  . COPD Mother   . Arthritis Mother   . Diabetes Father   . Heart disease Father   . Hypertension Brother   . Diabetes Paternal Grandmother   . Diabetes Maternal Grandmother   . Dementia Paternal Grandfather     Social History   Tobacco Use  . Smoking status: Current Some Day Smoker    Packs/day: 0.20    Years: 30.00    Pack years: 6.00    Types: Cigarettes  . Smokeless tobacco: Never Used  Substance Use Topics  . Alcohol use: Yes    Comment: occ  . Drug use: No    Home  Medications Prior to Admission medications   Medication Sig Start Date End Date Taking? Authorizing Provider  ibuprofen (ADVIL) 200 MG tablet Take 800 mg by mouth every 8 (eight) hours as needed for moderate pain.    Yes [provider]  insulin NPH-regular Human (70-30) 100 UNIT/ML injection Inject 20 Units into the skin 2 (two) times daily with a meal. OR AS DIRECTED.  REDUCE DOSE BY 5 UNITS IF YOU HAVE ANY BLOOD SUGAR LESS THAN 100. Patient taking differently: Inject 15 Units into the skin 2 (two) times daily with a meal.  02/14/20  Yes Perlie Mayo, NP  metFORMIN (GLUCOPHAGE) 1000 MG tablet Take 1 tablet (1,000 mg total) by mouth 2 (two) times daily with a meal. 02/14/20  Yes Perlie Mayo, NP  omeprazole (PRILOSEC) 20 MG capsule Take 20 mg by mouth daily.   Yes [provider]    Allergies    Codeine and Sulfa antibiotics  Review of Systems   Review of Systems  All other systems are reviewed and are negative for acute change except as noted in the HPI.   Physical Exam Updated Vital Signs BP 107/78   Pulse 66   Resp 17   Ht 5\' 9"  (1.753 m)   Wt 98.4 kg   LMP 01/30/2020   SpO2 98%   BMI 32.05 kg/m   Physical Exam Vitals and nursing note reviewed.  Constitutional:      General: She is not in acute distress.    Appearance: She is not ill-appearing.  HENT:     Head: Normocephalic and atraumatic.     Right Ear: Tympanic membrane and external ear normal.     Left Ear: Tympanic membrane and external ear normal.     Nose: Nose normal.     Mouth/Throat:     Mouth: Mucous membranes are moist.     Pharynx: Oropharynx is clear.  Eyes:     General: No scleral icterus.       Right eye: No discharge.        Left eye: No discharge.     Extraocular Movements: Extraocular movements intact.     Conjunctiva/sclera: Conjunctivae normal.     Pupils: Pupils are equal, round, and reactive to light.     Comments: No nystagmus   Neck:     Vascular: No JVD.      Comments: No meningeal signs Cardiovascular:     Rate and Rhythm: Normal rate and regular rhythm.     Pulses: Normal pulses.          Radial pulses are 2+ on the right side and 2+ on the left side.     Heart sounds: Normal heart sounds.  Pulmonary:     Comments: Lungs clear to auscultation  in all fields. Symmetric chest rise. No wheezing, rales, or rhonchi. Abdominal:     Comments: Abdomen is soft, non-distended, and non-tender in all quadrants. No rigidity, no guarding. No peritoneal signs.  Musculoskeletal:        General: Normal range of motion.     Cervical back: Normal range of motion.  Skin:    General: Skin is warm and dry.     Capillary Refill: Capillary refill takes less than 2 seconds.  Neurological:     Mental Status: She is oriented to person, place, and time.     GCS: GCS eye subscore is 4. GCS verbal subscore is 5. GCS motor subscore is 6.     Comments: Fluent speech, no facial droop.  Speech is clear and goal oriented, follows commands CN III-XII intact, no facial droop Normal strength in upper and lower extremities bilaterally including dorsiflexion and plantar flexion, strong and equal grip strength Sensation normal to light and sharp touch Moves extremities without ataxia, coordination intact Normal finger to nose and rapid alternating movements    Psychiatric:        Behavior: Behavior normal.     ED Results / Procedures / Treatments   Labs (all labs ordered are listed, but only abnormal results are displayed) Labs Reviewed  COMPREHENSIVE METABOLIC PANEL - Abnormal; Notable for the following components:      Result Value   Potassium 3.3 (*)    Glucose, Bld 179 (*)    AST 45 (*)    ALT 68 (*)    All other components within normal limits  CBG MONITORING, ED - Abnormal; Notable for the following components:   Glucose-Capillary 187 (*)    All other components within normal limits  SARS CORONAVIRUS 2 (TAT 6-24 HRS)  CBC WITH DIFFERENTIAL/PLATELET   URINALYSIS, ROUTINE W REFLEX MICROSCOPIC    EKG EKG Interpretation  Date/Time:  Wednesday March 29 2020 17:26:19 EDT Ventricular Rate:  66 PR Interval:    QRS Duration: 91 QT Interval:  410 QTC Calculation: 430 R Axis:   -65 Text Interpretation: Sinus rhythm Inferior infarct, old Consider anterior infarct Baseline wander in lead(s) V2 Confirmed by Vanetta Mulders [phone]) on 03/29/2020 7:30:46 PM   Radiology CT Head Wo Contrast  Result Date: 03/29/2020 CLINICAL DATA:  Room spinning dizziness; dizziness, nonspecific. EXAM: CT HEAD WITHOUT CONTRAST TECHNIQUE: Contiguous axial images were obtained from the base of the skull through the vertex without intravenous contrast. COMPARISON:  Head CT 10/10/2019. FINDINGS: Brain: There is no evidence of acute intracranial hemorrhage, intracranial mass, midline shift or extra-axial fluid collection.No demarcated cortical infarction. Cerebral volume is normal for age. Vascular: No hyperdense vessel. Skull: Normal. Negative for fracture or focal lesion. Sinuses/Orbits: Visualized orbits demonstrate no acute abnormality. Partially imaged moderate-sized right maxillary sinus mucous retention cyst. No significant mastoid effusion. IMPRESSION: No evidence of acute intracranial abnormality. Right maxillary sinus mucous retention cyst. Electronically Signed   By: Jackey Loge DO   On: 03/29/2020 19:54    Procedures Procedures (including critical care time)  Medications Ordered in ED Medications  aspirin chewable tablet 324 mg (has no administration in time range)  sodium chloride 0.9 % bolus 1,000 mL (0 mLs Intravenous Stopped 03/29/20 1924)  metoCLOPramide (REGLAN) injection 5 mg (5 mg Intravenous Given 03/29/20 1815)  diphenhydrAMINE (BENADRYL) injection 25 mg (25 mg Intravenous Given 03/29/20 1814)  meclizine (ANTIVERT) tablet 25 mg (25 mg Oral Given 03/29/20 2008)    ED Course  I have reviewed the triage vital  signs and the nursing notes.  Pertinent  labs & imaging results that were available during my care of the patient were reviewed by me and considered in my medical decision making (see chart for details).    MDM Rules/Calculators/A&P                      Patient seen and examined. Patient presents awake, alert, hemodynamically stable, afebrile, non toxic. Patient is unable to ambulate because of her dizziness. No nystagmus on exam. No history of vertigo.  No focal weakness. Normal grip strength of bilateral upper and lower extremities. Following commands, no aphasia.  Labs show no leukocytosis, no anemia, no severe electrolyte derangement, no renal insufficiency.  She does have elevated glucose at 179, without signs of DKA or metabolic acidosis.Marland Kitchen  LFTs are slightly elevated with AST of 45 and ALT of 68.  EKG without ischemic changes. Patient had UA at urgent care prior to arrival.  I viewed results which show no signs of infection.  A urine culture is in process. Patient given IV reglan, benadryl and 1 L normal saline. On reassessment she reports headache has improved however she is continuing to feel dizzy. She is unable to stand to complete and unable to stand to complete orthostatic vitals. CT head is negative for stroke. Patient continues to have dizziness and cannot ambulate because of it.  MRI is unavailable at this time. With her risk factors for CVA will admit for need of MRI. The patient was discussed with and seen by Dr. Deretha Emory who agrees with the treatment plan. Patient given aspirin.  Spoke with Dr. Randol Kern  with hospitalist service who agrees to assume care of patient and bring into the hospital for further evaluation and management. Appreciate admission.      Portions of this note were generated with Scientist, clinical (histocompatibility and immunogenetics). Dictation errors may occur despite best attempts at proofreading.   Final Clinical Impression(s) / ED Diagnoses Final diagnoses:  Dizziness    Rx / DC Orders ED Discharge Orders    None        Kathyrn Lass 03/29/20 2138    Vanetta Mulders, MD 04/05/20 1045

## 2020-03-30 ENCOUNTER — Observation Stay (HOSPITAL_COMMUNITY): Payer: Self-pay

## 2020-03-30 LAB — CBC
HCT: 37 % (ref 36.0–46.0)
Hemoglobin: 12 g/dL (ref 12.0–15.0)
MCH: 30.6 pg (ref 26.0–34.0)
MCHC: 32.4 g/dL (ref 30.0–36.0)
MCV: 94.4 fL (ref 80.0–100.0)
Platelets: 225 10*3/uL (ref 150–400)
RBC: 3.92 MIL/uL (ref 3.87–5.11)
RDW: 13.2 % (ref 11.5–15.5)
WBC: 6.2 10*3/uL (ref 4.0–10.5)
nRBC: 0 % (ref 0.0–0.2)

## 2020-03-30 LAB — BASIC METABOLIC PANEL
Anion gap: 9 (ref 5–15)
BUN: 11 mg/dL (ref 6–20)
CO2: 23 mmol/L (ref 22–32)
Calcium: 8.6 mg/dL — ABNORMAL LOW (ref 8.9–10.3)
Chloride: 106 mmol/L (ref 98–111)
Creatinine, Ser: 0.59 mg/dL (ref 0.44–1.00)
GFR calc Af Amer: >60 mL/min (ref >60)
GFR calc non Af Amer: >60 mL/min (ref >60)
Glucose, Bld: 204 mg/dL — ABNORMAL HIGH (ref 70–99)
Potassium: 3.3 mmol/L — ABNORMAL LOW (ref 3.5–5.1)
Sodium: 138 mmol/L (ref 135–145)

## 2020-03-30 LAB — URINE CULTURE: Culture: NO GROWTH

## 2020-03-30 LAB — GLUCOSE, CAPILLARY
Glucose-Capillary: 164 mg/dL — ABNORMAL HIGH (ref 70–99)
Glucose-Capillary: 249 mg/dL — ABNORMAL HIGH (ref 70–99)

## 2020-03-30 LAB — SARS CORONAVIRUS 2 (TAT 6-24 HRS): SARS Coronavirus 2: NEGATIVE

## 2020-03-30 LAB — MAGNESIUM: Magnesium: 1.6 mg/dL — ABNORMAL LOW (ref 1.7–2.4)

## 2020-03-30 MED ORDER — MECLIZINE HCL 25 MG PO TABS: 25.0000 mg | ORAL_TABLET | Freq: Three times a day (TID) | ORAL | 0 refills | Status: AC | PRN

## 2020-03-30 MED ORDER — POTASSIUM CHLORIDE CRYS ER 20 MEQ PO TBCR
50.0000 meq | EXTENDED_RELEASE_TABLET | Freq: Once | ORAL | Status: AC
  Administered 2020-03-30: 50 meq via ORAL
  Filled 2020-03-30: qty 1

## 2020-03-30 NOTE — Discharge Summary (Signed)
Physician Discharge Summary  Alexandria Mejia PNT:[id number] DOB: 1971/12/10 DOA: 03/29/2020  PCP: Alexandria Finner, NP  Admit date: 03/29/2020 Discharge date: 03/30/2020  Admitted From:  HOME  Disposition:  HOME   Recommendations for Outpatient Follow-up:  1. Follow up with PCP in 1 weeks for recheck 2. Use meclizine as needed if symptoms return 3. Please arrange outpatient PT for vestibular rehab if symptoms return  Discharge Condition: STABLE   CODE STATUS: FULL    Brief Hospitalization Summary: Please see all hospital notes, images, labs for full details of the hospitalization. ADMISSION HPI:    Alexandria Mejia  is a 49 y.o. female, past medical history significant for diabetes mellitus, DKA, migraines, presents to ED secondary to complaints of dizziness, patient reports her symptoms started today, while eating lunch, described by feeling as the room is spinning, as well she does report nausea and vomiting, initially she thought it secondary to low blood sugar, but her glucose were in the lower 200s, went to urgent care, she was sent to ED for further evaluation, as well she does report some headache, feels like her migraine, she does report similar episodes of vertigo in the past, but it was not accompanied by headache, she denies any focal deficits, tingling or numbness, no slurred speech or altered mental status. -In ED CT head with no acute findings, CBG is controlled, no significant lab abnormalities, she did receive Benadryl, meclizine and IV Reglan, with no improvement of symptoms, so triage hospitalist requested to admit for further evaluation.  Hospital Course   Pt was admitted for Benign positional vertigo symptoms and started on IV fluids, Meclizine scheduled around the clock and supportive care.  Pt responded well to this.  MRI brain was totally normal.  Pt reported that her symptoms resolved spontaneously and requested to go home.  Pt says that she was not able to have PT  vestibular exam done due to being on meclizine.  Now that symptoms have resolved, pt is stable to discharge home with outpatient follow up.    Diabetes mellitus. -Continue with home regimen for treatment and close outpatient follow up.    GERD -Continue with PPI  Discharge Diagnoses:  Active Problems:   GERD (gastroesophageal reflux disease)   Diabetes mellitus (HCC)   Vertigo   Discharge Instructions:  Allergies as of 03/30/2020      Reactions   Codeine Itching   Sulfa Antibiotics Itching, Swelling      Medication List    TAKE these medications   ibuprofen 200 MG tablet Commonly known as: ADVIL Take 800 mg by mouth every 8 (eight) hours as needed for moderate pain.   insulin NPH-regular Human (70-30) 100 UNIT/ML injection Inject 20 Units into the skin 2 (two) times daily with a meal. OR AS DIRECTED.  REDUCE DOSE BY 5 UNITS IF YOU HAVE ANY BLOOD SUGAR LESS THAN 100. What changed:   how much to take  additional instructions   meclizine 25 MG tablet Commonly known as: ANTIVERT Take 1 tablet (25 mg total) by mouth 3 (three) times daily as needed for dizziness.   metFORMIN 1000 MG tablet Commonly known as: GLUCOPHAGE Take 1 tablet (1,000 mg total) by mouth 2 (two) times daily with a meal.   omeprazole 20 MG capsule Commonly known as: PRILOSEC Take 20 mg by mouth daily.      Follow-up Information    Alexandria Finner, NP. Schedule an appointment as soon as possible for a visit in 1 week(s).  Specialty: Family Medicine Why: Hospital Follow Up  Contact information: [address] [place] [address] [phone]          Allergies  Allergen Reactions  . Codeine Itching  . Sulfa Antibiotics Itching and Swelling   Allergies as of 03/30/2020      Reactions   Codeine Itching   Sulfa Antibiotics Itching, Swelling      Medication List    TAKE these medications   ibuprofen 200 MG tablet Commonly known as: ADVIL Take 800 mg by mouth every 8 (eight)  hours as needed for moderate pain.   insulin NPH-regular Human (70-30) 100 UNIT/ML injection Inject 20 Units into the skin 2 (two) times daily with a meal. OR AS DIRECTED.  REDUCE DOSE BY 5 UNITS IF YOU HAVE ANY BLOOD SUGAR LESS THAN 100. What changed:   how much to take  additional instructions   meclizine 25 MG tablet Commonly known as: ANTIVERT Take 1 tablet (25 mg total) by mouth 3 (three) times daily as needed for dizziness.   metFORMIN 1000 MG tablet Commonly known as: GLUCOPHAGE Take 1 tablet (1,000 mg total) by mouth 2 (two) times daily with a meal.   omeprazole 20 MG capsule Commonly known as: PRILOSEC Take 20 mg by mouth daily.       Procedures/Studies: CT Head Wo Contrast  Result Date: 03/29/2020 CLINICAL DATA:  Room spinning dizziness; dizziness, nonspecific. EXAM: CT HEAD WITHOUT CONTRAST TECHNIQUE: Contiguous axial images were obtained from the base of the skull through the vertex without intravenous contrast. COMPARISON:  Head CT 10/10/2019. FINDINGS: Brain: There is no evidence of acute intracranial hemorrhage, intracranial mass, midline shift or extra-axial fluid collection.No demarcated cortical infarction. Cerebral volume is normal for age. Vascular: No hyperdense vessel. Skull: Normal. Negative for fracture or focal lesion. Sinuses/Orbits: Visualized orbits demonstrate no acute abnormality. Partially imaged moderate-sized right maxillary sinus mucous retention cyst. No significant mastoid effusion. IMPRESSION: No evidence of acute intracranial abnormality. Right maxillary sinus mucous retention cyst. Electronically Signed   By: Jackey Loge DO   On: 03/29/2020 19:54   MR BRAIN WO CONTRAST  Result Date: 03/30/2020 CLINICAL DATA:  Vertigo EXAM: MRI HEAD WITHOUT CONTRAST TECHNIQUE: Multiplanar, multiecho pulse sequences of the brain and surrounding structures were obtained without intravenous contrast. COMPARISON:  2008 FINDINGS: Motion artifact is present Brain:  There is no acute infarction or intracranial hemorrhage. There is no intracranial mass, mass effect, or edema. There is no hydrocephalus or extra-axial fluid collection. Ventricles and sulci are normal in size and configuration. Vascular: Major vessel flow voids at the skull base are preserved. Skull and upper cervical spine: Normal marrow signal is preserved. Sinuses/Orbits: Mild mucosal thickening.  Orbits are unremarkable. Other: Sella is unremarkable. Minimal patchy mastoid fluid opacification. IMPRESSION: No evidence of recent infarction, hemorrhage, or mass. Electronically Signed   By: Guadlupe Spanish M.D.   On: 03/30/2020 09:34      Subjective: Pt says that the dizziness has completely resolved now.  She wants to go home, She is up in chair and ambulating in room.  No headache, no photophobia and no other concerns.   Discharge Exam: Vitals:   03/30/20 0245 03/30/20 0642  BP: 96/62 (!) 93/57  Pulse: 66 (!) 52  Resp: 16 16  Temp: 97.6 F (36.4 C) 97.7 F (36.5 C)  SpO2: 97% 99%   Vitals:   03/29/20 2247 03/29/20 2249 03/30/20 0245 03/30/20 0642  BP: 103/65 103/75 96/62 (!) 93/57  Pulse: 64 72 66 (!)  52  Resp:   16 16  Temp:   97.6 F (36.4 C) 97.7 F (36.5 C)  TempSrc:   Oral Oral  SpO2:   97% 99%  Weight:  101 kg    Height:  5\' 9"  (1.753 m)      General: Pt is alert, awake, not in acute distress Cardiovascular: RRR, S1/S2 +, no rubs, no gallops Respiratory: CTA bilaterally, no wheezing, no rhonchi Abdominal: Soft, NT, ND, bowel sounds + Extremities: no edema, no cyanosis Neurological: nonfocal exam.    The results of significant diagnostics from this hospitalization (including imaging, microbiology, ancillary and laboratory) are listed below for reference.     Microbiology: No results found for this or any previous visit (from the past 240 hour(s)).   Labs: BNP (last 3 results) No results for input(s): BNP in the last 8760 hours. Basic Metabolic Panel: Recent  Labs  Lab 03/29/20 1745 03/30/20 0543  NA 137 138  K 3.3* 3.3*  CL 101 106  CO2 23 23  GLUCOSE 179* 204*  BUN 13 11  CREATININE 0.65 0.59  CALCIUM 9.1 8.6*  MG  --  1.6*   Liver Function Tests: Recent Labs  Lab 03/29/20 1745  AST 45*  ALT 68*  ALKPHOS 68  BILITOT 0.6  PROT 7.5  ALBUMIN 4.2   No results for input(s): LIPASE, AMYLASE in the last 168 hours. No results for input(s): AMMONIA in the last 168 hours. CBC: Recent Labs  Lab 03/29/20 1745 03/30/20 0543  WBC 8.1 6.2  NEUTROABS 4.1  --   HGB 13.9 12.0  HCT 41.2 37.0  MCV 93.4 94.4  PLT 275 225   Cardiac Enzymes: No results for input(s): CKTOTAL, CKMB, CKMBINDEX, TROPONINI in the last 168 hours. BNP: Invalid input(s): POCBNP CBG: Recent Labs  Lab 03/29/20 1813 03/29/20 2259 03/30/20 0729  GLUCAP 187* 112* 164*   D-Dimer No results for input(s): DDIMER in the last 72 hours. Hgb A1c No results for input(s): HGBA1C in the last 72 hours. Lipid Profile No results for input(s): CHOL, HDL, LDLCALC, TRIG, CHOLHDL, LDLDIRECT in the last 72 hours. Thyroid function studies No results for input(s): TSH, T4TOTAL, T3FREE, THYROIDAB in the last 72 hours.  Invalid input(s): FREET3 Anemia work up No results for input(s): VITAMINB12, FOLATE, FERRITIN, TIBC, IRON, RETICCTPCT in the last 72 hours. Urinalysis    Component Value Date/Time   COLORURINE YELLOW 03/29/2020 [place] 03/29/2020 1744   LABSPEC 1.014 03/29/2020 1744   PHURINE 5.0 03/29/2020 1744   GLUCOSEU NEGATIVE 03/29/2020 1744   HGBUR NEGATIVE 03/29/2020 [place] 03/29/2020 1744   BILIRUBINUR negative 03/29/2020 1625   KETONESUR NEGATIVE 03/29/2020 1744   KETONESUR negative 03/29/2020 1625   PROTEINUR NEGATIVE 03/29/2020 1744   PROTEINUR negative 03/29/2020 1625   UROBILINOGEN 0.2 03/29/2020 1625   UROBILINOGEN 0.2 09/06/2013 1950   NITRITE NEGATIVE 03/29/2020 1744   NITRITE Negative 03/29/2020 1625    LEUKOCYTESUR NEGATIVE 03/29/2020 1744   LEUKOCYTESUR Negative 03/29/2020 1625   Sepsis Labs Invalid input(s): PROCALCITONIN,  WBC,  LACTICIDVEN Microbiology No results found for this or any previous visit (from the past 240 hour(s)).  Time coordinating discharge:   SIGNED:  Irwin Brakeman, MD  [facility] 03/30/2020, 9:59 AM How to contact the [facility] Attending or Consulting provider [place] or covering provider during after hours [place], for this patient?  1. Check the care team in [facility] and look for a) attending/consulting [place] provider listed and  b) the [facility] team listed 2. Log into [email or web address] and use 's universal password to access. If you do not have the password, please contact the hospital operator. 3. Locate the [facility] provider you are looking for under [facility] and page to a number that you can be directly reached. 4. If you still have difficulty reaching the provider, please page the [facility] (Director on Call) for the Hospitalists listed on amion for assistance.

## 2020-03-30 NOTE — Discharge Instructions (Signed)
Dizziness Dizziness is a common problem. It makes you feel unsteady or light-headed. You may feel like you are about to pass out (faint). Dizziness can lead to getting hurt if you stumble or fall. Dizziness can be caused by many things, including:  Medicines.  Not having enough water in your body (dehydration).  Illness. Follow these instructions at home: Eating and drinking   Drink enough fluid to keep your pee (urine) clear or pale yellow. This helps to keep you from getting dehydrated. Try to drink more clear fluids, such as water.  Do not drink alcohol.  Limit how much caffeine you drink or eat, if your doctor tells you to do that.  Limit how much salt (sodium) you drink or eat, if your doctor tells you to do that. Activity   Avoid making quick movements. ? When you stand up from sitting in a chair, steady yourself until you feel okay. ? In the morning, first sit up on the side of the bed. When you feel okay, stand slowly while you hold onto something. Do this until you know that your balance is fine.  If you need to stand in one place for a long time, move your legs often. Tighten and relax the muscles in your legs while you are standing.  Do not drive or use heavy machinery if you feel dizzy.  Avoid bending down if you feel dizzy. Place items in your home so you can reach them easily without leaning over. Lifestyle  Do not use any products that contain nicotine or tobacco, such as cigarettes and e-cigarettes. If you need help quitting, ask your doctor.  Try to lower your stress level. You can do this by using methods such as yoga or meditation. Talk with your doctor if you need help. General instructions  Watch your dizziness for any changes.  Take over-the-counter and prescription medicines only as told by your doctor. Talk with your doctor if you think that you are dizzy because of a medicine that you are taking.  Tell a friend or a family member that you are feeling  dizzy. If he or she notices any changes in your behavior, have this person call your doctor.  Keep all follow-up visits as told by your doctor. This is important. Contact a doctor if:  Your dizziness does not go away.  Your dizziness or light-headedness gets worse.  You feel sick to your stomach (nauseous).  You have trouble hearing.  You have new symptoms.  You are unsteady on your feet.  You feel like the room is spinning. Get help right away if:  You throw up (vomit) or have watery poop (diarrhea), and you cannot eat or drink anything.  You have trouble: ? Talking. ? Walking. ? Swallowing. ? Using your arms, hands, or legs.  You feel generally weak.  You are not thinking clearly, or you have trouble forming sentences. A friend or family member may notice this.  You have: ? Chest pain. ? Pain in your belly (abdomen). ? Shortness of breath. ? Sweating.  Your vision changes.  You are bleeding.  You have a very bad headache.  You have neck pain or a stiff neck.  You have a fever. These symptoms may be an emergency. Do not wait to see if the symptoms will go away. Get medical help right away. Call your local emergency services (911 in the [place] Do not drive yourself to the hospital. Summary  Dizziness makes you feel unsteady or light-headed. You   may feel like you are about to pass out (faint).  Drink enough fluid to keep your pee (urine) clear or pale yellow. Do not drink alcohol.  Avoid making quick movements if you feel dizzy.  Watch your dizziness for any changes. This information is not intended to replace advice given to you by your health care provider. Make sure you discuss any questions you have with your health care provider. Document Revised: 12/05/2017 Document Reviewed: 12/19/2016 Elsevier Patient Education  [employer].  Dizziness Dizziness is a common problem. It makes you feel unsteady or light-headed. You may feel like you are about  to pass out (faint). Dizziness can lead to getting hurt if you stumble or fall. Dizziness can be caused by many things, including:  Medicines.  Not having enough water in your body (dehydration).  Illness. Follow these instructions at home: Eating and drinking   Drink enough fluid to keep your pee (urine) clear or pale yellow. This helps to keep you from getting dehydrated. Try to drink more clear fluids, such as water.  Do not drink alcohol.  Limit how much caffeine you drink or eat, if your doctor tells you to do that.  Limit how much salt (sodium) you drink or eat, if your doctor tells you to do that. Activity   Avoid making quick movements. ? When you stand up from sitting in a chair, steady yourself until you feel okay. ? In the morning, first sit up on the side of the bed. When you feel okay, stand slowly while you hold onto something. Do this until you know that your balance is fine.  If you need to stand in one place for a long time, move your legs often. Tighten and relax the muscles in your legs while you are standing.  Do not drive or use heavy machinery if you feel dizzy.  Avoid bending down if you feel dizzy. Place items in your home so you can reach them easily without leaning over. Lifestyle  Do not use any products that contain nicotine or tobacco, such as cigarettes and e-cigarettes. If you need help quitting, ask your doctor.  Try to lower your stress level. You can do this by using methods such as yoga or meditation. Talk with your doctor if you need help. General instructions  Watch your dizziness for any changes.  Take over-the-counter and prescription medicines only as told by your doctor. Talk with your doctor if you think that you are dizzy because of a medicine that you are taking.  Tell a friend or a family member that you are feeling dizzy. If he or she notices any changes in your behavior, have this person call your doctor.  Keep all follow-up  visits as told by your doctor. This is important. Contact a doctor if:  Your dizziness does not go away.  Your dizziness or light-headedness gets worse.  You feel sick to your stomach (nauseous).  You have trouble hearing.  You have new symptoms.  You are unsteady on your feet.  You feel like the room is spinning. Get help right away if:  You throw up (vomit) or have watery poop (diarrhea), and you cannot eat or drink anything.  You have trouble: ? Talking. ? Walking. ? Swallowing. ? Using your arms, hands, or legs.  You feel generally weak.  You are not thinking clearly, or you have trouble forming sentences. A friend or family member may notice this.  You have: ? Chest pain. ? Pain  in your belly (abdomen). ? Shortness of breath. ? Sweating.  Your vision changes.  You are bleeding.  You have a very bad headache.  You have neck pain or a stiff neck.  You have a fever. These symptoms may be an emergency. Do not wait to see if the symptoms will go away. Get medical help right away. Call your local emergency services (911 in the [place] Do not drive yourself to the hospital. Summary  Dizziness makes you feel unsteady or light-headed. You may feel like you are about to pass out (faint).  Drink enough fluid to keep your pee (urine) clear or pale yellow. Do not drink alcohol.  Avoid making quick movements if you feel dizzy.  Watch your dizziness for any changes. This information is not intended to replace advice given to you by your health care provider. Make sure you discuss any questions you have with your health care provider. Document Revised: 12/05/2017 Document Reviewed: 12/19/2016 Elsevier Patient Education  2020 [employer].   IMPORTANT INFORMATION: PAY CLOSE ATTENTION   PHYSICIAN DISCHARGE INSTRUCTIONS  Follow with Primary care provider  Freddy Finner, NP  and other consultants as instructed by your Hospitalist Physician  SEEK MEDICAL CARE OR  RETURN TO EMERGENCY ROOM IF SYMPTOMS COME BACK, WORSEN OR NEW PROBLEM DEVELOPS   Please note: You were cared for by a hospitalist during your hospital stay. Every effort will be made to forward records to your primary care provider.  You can request that your primary care provider send for your hospital records if they have not received them.  Once you are discharged, your primary care physician will handle any further medical issues. Please note that NO REFILLS for any discharge medications will be authorized once you are discharged, as it is imperative that you return to your primary care physician (or establish a relationship with a primary care physician if you do not have one) for your post hospital discharge needs so that they can reassess your need for medications and monitor your lab values.  Please get a complete blood count and chemistry panel checked by your Primary MD at your next visit, and again as instructed by your Primary MD.  Get Medicines reviewed and adjusted: Please take all your medications with you for your next visit with your Primary MD  Laboratory/radiological data: Please request your Primary MD to go over all hospital tests and procedure/radiological results at the follow up, please ask your primary care provider to get all Hospital records sent to his/her office.  In some cases, they will be blood work, cultures and biopsy results pending at the time of your discharge. Please request that your primary care provider follow up on these results.  If you are diabetic, please bring your blood sugar readings with you to your follow up appointment with primary care.    Please call and make your follow up appointments as soon as possible.    Also Note the following: If you experience worsening of your admission symptoms, develop shortness of breath, life threatening emergency, suicidal or homicidal thoughts you must seek medical attention immediately by calling 911 or calling  your MD immediately  if symptoms less severe.  You must read complete instructions/literature along with all the possible adverse reactions/side effects for all the Medicines you take and that have been prescribed to you. Take any new Medicines after you have completely understood and accpet all the possible adverse reactions/side effects.   Do not drive when taking Pain  medications or sleeping medications (Benzodiazepines)  Do not take more than prescribed Pain, Sleep and Anxiety Medications. It is not advisable to combine anxiety,sleep and pain medications without talking with your primary care practitioner  Special Instructions: If you have smoked or chewed Tobacco  in the last 2 yrs please stop smoking, stop any regular Alcohol  and or any Recreational drug use.  Wear Seat belts while driving.  Do not drive if taking any narcotic, mind altering or controlled substances or recreational drugs or alcohol.

## 2020-03-30 NOTE — Evaluation (Signed)
Physical Therapy Evaluation Patient Details Name: Alexandria Mejia MRN: [id number] DOB: 09-20-1971 Today's Date: 03/30/2020   History of Present Illness  Alexandria Mejia  is a 49 y.o. female, past medical history significant for diabetes mellitus, DKA, migraines, presents to ED secondary to complaints of dizziness, patient reports her symptoms started today, while eating lunch, described by feeling as the room is spinning, as well she does report nausea and vomiting, initially she thought it secondary to low blood sugar, but her glucose were in the lower 200s, went to urgent care, she was sent to ED for further evaluation, as well she does report some headache, feels like her migraine, she does report similar episodes of vertigo in the past, but it was not accompanied by headache, she denies any focal deficits, tingling or numbness, no slurred speech or altered mental status.    Clinical Impression  Patient functioning at baseline for functional mobility and gait.  Patient given meclizine during admission and not appropriate for vestibular assessment due to masking symptoms.  Recommend follow up in [place] [facility] for an vestibular evaluation if all other medical issues ruled out, patient will have to be off dizziness medication for at least 4 days prior to visit if she chooses to follow up.  Plan:  Patient discharged from physical therapy to care of nursing for ambulation daily as tolerated for length of stay.     Follow Up Recommendations No PT follow up    Equipment Recommendations  None recommended by PT    Recommendations for Other Services       Precautions / Restrictions Precautions Precautions: None Restrictions Weight Bearing Restrictions: No      Mobility  Bed Mobility Overal bed mobility: Independent                Transfers Overall transfer level: Modified independent                  Ambulation/Gait Ambulation/Gait assistance: Modified independent  (Device/Increase time) Gait Distance (Feet): 250 Feet Assistive device: None Gait Pattern/deviations: WFL(Within Functional Limits) Gait velocity: slightly decreased   General Gait Details: demonstrates good return for ambulation on level, inclined and declined surfaces without loss of balance  Stairs            Wheelchair Mobility    Modified Rankin (Stroke Patients Only)       Balance Overall balance assessment: No apparent balance deficits (not formally assessed)                                           Pertinent Vitals/Pain Pain Assessment: No/denies pain    Home Living Family/patient expects to be discharged to:: Private residence Living Arrangements: Parent;Non-relatives/Friends Available Help at Discharge: Family Type of Home: House Home Access: Ramped entrance     Home Layout: One level Home Equipment: None      Prior Function Level of Independence: Independent         Comments: Hydrographic surveyor, drives     Journalist, newspaper        Extremity/Trunk Assessment   Upper Extremity Assessment Upper Extremity Assessment: Overall WFL for tasks assessed    Lower Extremity Assessment Lower Extremity Assessment: Overall WFL for tasks assessed    Cervical / Trunk Assessment Cervical / Trunk Assessment: Normal  Communication   Communication: No difficulties  Cognition Arousal/Alertness: Awake/alert Behavior During Therapy: WFL for tasks  assessed/performed Overall Cognitive Status: Within Functional Limits for tasks assessed                                        General Comments      Exercises     Assessment/Plan    PT Assessment Patent does not need any further PT services  PT Problem List         PT Treatment Interventions      PT Goals (Current goals can be found in the Care Plan section)  Acute Rehab PT Goals Patient Stated Goal: Return home with family/friends to assist PT Goal Formulation:  With patient Time For Goal Achievement: 03/30/20 Potential to Achieve Goals: Good    Frequency     Barriers to discharge        Co-evaluation               AM-PAC PT "6 Clicks" Mobility  Outcome Measure Help needed turning from your back to your side while in a flat bed without using bedrails?: None Help needed moving from lying on your back to sitting on the side of a flat bed without using bedrails?: None Help needed moving to and from a bed to a chair (including a wheelchair)?: None Help needed standing up from a chair using your arms (e.g., wheelchair or bedside chair)?: None Help needed to walk in hospital room?: None Help needed climbing 3-5 steps with a railing? : None 6 Click Score: 24    End of Session   Activity Tolerance: Patient tolerated treatment well Patient left: in chair;with call bell/phone within reach Nurse Communication: Mobility status PT Visit Diagnosis: Unsteadiness on feet (R26.81);Other abnormalities of gait and mobility (R26.89);Muscle weakness (generalized) (M62.81)    Time: 2197-5883 PT Time Calculation (min) (ACUTE ONLY): 13 min   Charges:   PT Evaluation $PT Eval Low Complexity: 1 Low PT Treatments $Therapeutic Activity: 8-22 mins        10:06 AM, 03/30/20 Ocie Bob, MPT Physical Therapist with [facility] 336 [phone] office [phone] mobile phone

## 2020-04-20 ENCOUNTER — Other Ambulatory Visit: Payer: Self-pay

## 2020-04-20 ENCOUNTER — Ambulatory Visit
Admission: EM | Admit: 2020-04-20 | Discharge: 2020-04-20 | Disposition: A | Discharge status: Home / Self Care | Payer: Self-pay | Attending: Emergency Medicine | Admitting: Emergency Medicine

## 2020-04-20 DIAGNOSIS — Z20822 Contact with and (suspected) exposure to covid-19: Secondary | ICD-10-CM | POA: Insufficient documentation

## 2020-04-20 DIAGNOSIS — J029 Acute pharyngitis, unspecified: Secondary | ICD-10-CM | POA: Insufficient documentation

## 2020-04-20 DIAGNOSIS — J069 Acute upper respiratory infection, unspecified: Secondary | ICD-10-CM | POA: Insufficient documentation

## 2020-04-20 LAB — POCT RAPID STREP A (OFFICE): Rapid Strep A Screen: NEGATIVE

## 2020-04-20 MED ORDER — FLUTICASONE PROPIONATE 50 MCG/ACT NA SUSP
2.0000 | Freq: Every day | NASAL | 0 refills | Status: AC
Start: 2020-04-20 — End: ?

## 2020-04-20 MED ORDER — CETIRIZINE HCL 10 MG PO TABS
10.0000 mg | ORAL_TABLET | Freq: Every day | ORAL | 0 refills | Status: AC
Start: 2020-04-20 — End: ?

## 2020-04-20 NOTE — Discharge Instructions (Signed)
Strep negative.  Culture sent.  We will call you with abnormal results You should remain isolated in your home for 10 days from symptom onset AND greater than 72 hours after symptoms resolution (absence of fever without the use of fever-reducing medication and improvement in respiratory symptoms), whichever is longer Get plenty of rest and push fluids Use OTC zyrtec for nasal congestion, runny nose, and/or sore throat Use OTC flonase for nasal congestion and runny nose Use medications daily for symptom relief Use OTC medications like ibuprofen or tylenol as needed fever or pain Call or go to the ED if you have any new or worsening symptoms such as fever, cough, shortness of breath, chest tightness, chest pain, turning blue, changes in mental status, etc..Marland Kitchen

## 2020-04-20 NOTE — ED Triage Notes (Signed)
Pt presents with c/o sore throat and head ache that began a couple days ago, per pt , has h/o frequent strep

## 2020-04-20 NOTE — ED Provider Notes (Addendum)
[facility]   [id number] 04/20/20 Arrival Time: 1650   CC: COVID symptoms  SUBJECTIVE: History from: patient.  Alexandria Mejia is a 49 y.o. female who presents with headache, and sore throat x 2 days.  Partner with similar symptoms.  Partner had a COVID test that was negative.  Denies aggravating or alleviating factors.  Reports previous symptoms in the past with strep throat.   Denies fever, chills, fatigue, SOB, wheezing, chest pain, nausea, changes in bowel or bladder habits.    ROS: As per HPI.  All other pertinent ROS negative.     Past Medical History:  Diagnosis Date  . Back pain   . Candidiasis, intertrigo 09/17/2012  . Complication of anesthesia    patient states "with last surgery I was hard to wake up".  . Diabetes mellitus without complication (HCC)   . DKA (diabetic ketoacidoses) (HCC)   . DKA (diabetic ketoacidoses) (HCC) 05/28/2019  . GERD (gastroesophageal reflux disease)   . H/O repair of right rotator cuff 10/24/2014  . Hypotension   . Migraine   . Partial tear of rotator cuff 03/01/2014  . Rotator cuff tear, right 02/14/2014  . Shoulder injury 02/14/2014  . Shoulder pain 09/17/2012   Past Surgical History:  Procedure Laterality Date  . APPENDECTOMY    . BACK SURGERY     lumbar-herniated disc  . CHOLECYSTECTOMY    . FLEXOR TENOTOMY Right 10/12/2014   Procedure: BICEPS TENOTOMY;  Surgeon: Vickki Hearing, MD;  Location: [facility];  Service: Orthopedics;  Laterality: Right;  . NECK SURGERY     decompression of 7 disc  . SHOULDER ARTHROSCOPY WITH ROTATOR CUFF REPAIR Right 05/27/2014   Procedure: SHOULDER ARTHROSCOPY WITH ROTATOR CUFF REPAIR, subscalpularis repair, open supraspinatus repair;  Surgeon: Vickki Hearing, MD;  Location: [facility];  Service: Orthopedics;  Laterality: Right;  . SHOULDER OPEN ROTATOR CUFF REPAIR Right 10/12/2014   Procedure: OPEN ROTATOR CUFF REPAIR SHOULDER;  Surgeon: Vickki Hearing, MD;  Location: [facility];  Service:  Orthopedics;  Laterality: Right;  . ULNAR NERVE TRANSPOSITION Right 11/13/2015   Procedure: RIGHT ULNAR NERVE TRANSPOSITION;  Surgeon: Mack Hook, MD;  Location: [facility];  Service: Orthopedics;  Laterality: Right;   Allergies  Allergen Reactions  . Codeine Itching  . Sulfa Antibiotics Itching and Swelling   No current facility-administered medications on file prior to encounter.   Current Outpatient Medications on File Prior to Encounter  Medication Sig Dispense Refill  . ibuprofen (ADVIL) 200 MG tablet Take 800 mg by mouth every 8 (eight) hours as needed for moderate pain.     Marland Kitchen insulin NPH-regular Human (70-30) 100 UNIT/ML injection Inject 20 Units into the skin 2 (two) times daily with a meal. OR AS DIRECTED.  REDUCE DOSE BY 5 UNITS IF YOU HAVE ANY BLOOD SUGAR LESS THAN 100. (Patient taking differently: Inject 15 Units into the skin 2 (two) times daily with a meal. ) 10 mL 1  . meclizine (ANTIVERT) 25 MG tablet Take 1 tablet (25 mg total) by mouth 3 (three) times daily as needed for dizziness. 30 tablet 0  . metFORMIN (GLUCOPHAGE) 1000 MG tablet Take 1 tablet (1,000 mg total) by mouth 2 (two) times daily with a meal. 90 tablet 1  . omeprazole (PRILOSEC) 20 MG capsule Take 20 mg by mouth daily.     Social History   Socioeconomic History  . Marital status: Single    Spouse name: Not on file  . Number of  children: Not on file  . Years of education: Not on file  . Highest education level: High school graduate  Occupational History  . Not on file  Tobacco Use  . Smoking status: Current Some Day Smoker    Packs/day: 0.20    Years: 30.00    Pack years: 6.00    Types: Cigarettes  . Smokeless tobacco: Never Used  Substance and Sexual Activity  . Alcohol use: Yes    Comment: occ  . Drug use: No  . Sexual activity: Yes    Birth control/protection: None    Comment: female partners only  Other Topics Concern  . Not on file  Social History Narrative   Living  with mom   No pets      Enjoy: fish, karaoke-turn the page by bob seger, hanging with friends      Diet: overall good, drinks, extra sugar, chips, meat   Caffeine: not really drinking it   Water: 8 cups plus daily      Wears seatbelt   Wears Suncreen   Smoke detectors carbon monoxide   Does not use phone while driving            Social Determinants of Health   Financial Resource Strain: Low Risk   . Difficulty of Paying Living Expenses: Not very hard  Food Insecurity: Food Insecurity Present  . Worried About Charity fundraiser in the Last Year: Sometimes true  . Ran Out of Food in the Last Year: Sometimes true  Transportation Needs: No Transportation Needs  . Lack of Transportation (Medical): No  . Lack of Transportation (Non-Medical): No  Physical Activity: Inactive  . Days of Exercise per Week: 0 days  . Minutes of Exercise per Session: 0 min  Stress: Stress Concern Present  . Feeling of Stress : To some extent  Social Connections: Moderately Isolated  . Frequency of Communication with Friends and Family: More than three times a week  . Frequency of Social Gatherings with Friends and Family: More than three times a week  . Attends Religious Services: Never  . Active Member of Clubs or Organizations: No  . Attends Archivist Meetings: Never  . Marital Status: Never married  Intimate Partner Violence: Not At Risk  . Fear of Current or Ex-Partner: No  . Emotionally Abused: No  . Physically Abused: No  . Sexually Abused: No   Family History  Problem Relation Age of Onset  . COPD Mother   . Arthritis Mother   . Diabetes Father   . Heart disease Father   . Hypertension Brother   . Diabetes Paternal Grandmother   . Diabetes Maternal Grandmother   . Dementia Paternal Grandfather     OBJECTIVE:  Vitals:   04/20/20 1713  BP: 114/69  Pulse: 73  Resp: 20  Temp: 97.7 F (36.5 C)  SpO2: 96%     General appearance: alert; appears fatigued, but  nontoxic; speaking in full sentences and tolerating own secretions HEENT: NCAT; Ears: EACs clear, TMs pearly gray; Eyes: PERRL.  EOM grossly intact. Nose: nares patent without rhinorrhea, Throat: oropharynx clear, tonsils non erythematous or enlarged, uvula midline  Neck: supple without LAD Lungs: unlabored respirations, symmetrical air entry; cough: absent; no respiratory distress; CTAB Heart: regular rate and rhythm.   Skin: warm and dry Psychological: alert and cooperative; normal mood and affect  LABS:  Results for orders placed or performed during the hospital encounter of 04/20/20 (from the past 24 hour(s))  POCT  rapid strep A     Status: None   Collection Time: 04/20/20  5:21 PM  Result Value Ref Range   Rapid Strep A Screen Negative Negative     ASSESSMENT & PLAN:  1. Sore throat   2. Viral URI   3. Suspected COVID-19 virus infection     Meds ordered this encounter  Medications  . cetirizine (ZYRTEC) 10 MG tablet    Sig: Take 1 tablet (10 mg total) by mouth daily.    Dispense:  30 tablet    Refill:  0    Order Specific Question:   Supervising Provider    Answer:   Eustace Moore [[id number]]  . fluticasone (FLONASE) 50 MCG/ACT nasal spray    Sig: Place 2 sprays into both nostrils daily.    Dispense:  16 g    Refill:  0    Order Specific Question:   Supervising Provider    Answer:   Eustace Moore [[id number]]    Strep negative.  Culture sent.  We will call you with abnormal results You should remain isolated in your home for 10 days from symptom onset AND greater than 72 hours after symptoms resolution (absence of fever without the use of fever-reducing medication and improvement in respiratory symptoms), whichever is longer Get plenty of rest and push fluids Use OTC zyrtec for nasal congestion, runny nose, and/or sore throat Use OTC flonase for nasal congestion and runny nose Use medications daily for symptom relief Use OTC medications like ibuprofen or  tylenol as needed fever or pain Call or go to the ED if you have any new or worsening symptoms such as fever, cough, shortness of breath, chest tightness, chest pain, turning blue, changes in mental status, etc...   Reviewed expectations re: course of current medical issues. Questions answered. Outlined signs and symptoms indicating need for more acute intervention. Patient verbalized understanding. After Visit Summary given.         Rennis Harding, PA-C 04/20/20 1737    Alvino Chapel Stanton, PA-C 04/20/20 1749

## 2020-04-21 LAB — NOVEL CORONAVIRUS, NAA: SARS-CoV-2, NAA: NOT DETECTED

## 2020-04-21 LAB — SARS-COV-2, NAA 2 DAY TAT

## 2020-04-23 LAB — CULTURE, GROUP A STREP (THRC)

## 2020-04-25 ENCOUNTER — Other Ambulatory Visit: Payer: Self-pay | Admitting: *Deleted

## 2020-04-25 DIAGNOSIS — E119 Type 2 diabetes mellitus without complications: Secondary | ICD-10-CM

## 2020-04-25 MED ORDER — INSULIN NPH ISOPHANE & REGULAR (70-30) 100 UNIT/ML ~~LOC~~ SUSP: 20.0000 [IU] | Freq: Two times a day (BID) | SUBCUTANEOUS | 1 refills | Status: AC

## 2020-04-25 MED ORDER — METFORMIN HCL 1000 MG PO TABS: 1000.0000 mg | ORAL_TABLET | Freq: Two times a day (BID) | ORAL | 1 refills | Status: DC

## 2020-05-31 ENCOUNTER — Telehealth: Payer: Self-pay | Admitting: Orthopedic Surgery

## 2020-05-31 NOTE — Telephone Encounter (Signed)
Call received from patient approximately 2:15pm today - states was seen in Emergency room at [facility] for problem of left shoulder injury; said strained or possibly pulled something while lifting a large trash bag over shoulder while at work. States it is the opposite shoulder, "not the one she had surgery done by Dr Romeo Apple".  York Spaniel will be speaking with her contact at work this afternoon. Discussed worker's comp approval and authorization including claim number. Appointment pending. Aware to keep in contact with our office regarding scheduling.

## 2020-06-07 NOTE — Telephone Encounter (Signed)
I have followed up with patient (most recently 06/01/20); still to talk with employer. Aware to contact us with this information for scheduling.

## 2020-08-23 ENCOUNTER — Other Ambulatory Visit: Payer: Self-pay | Admitting: *Deleted

## 2020-08-23 DIAGNOSIS — E119 Type 2 diabetes mellitus without complications: Secondary | ICD-10-CM

## 2020-08-23 MED ORDER — METFORMIN HCL 1000 MG PO TABS: 1000.0000 mg | ORAL_TABLET | Freq: Two times a day (BID) | ORAL | 1 refills | Status: DC

## 2020-09-07 ENCOUNTER — Other Ambulatory Visit: Payer: Self-pay

## 2020-09-07 ENCOUNTER — Encounter (HOSPITAL_COMMUNITY): Payer: Self-pay

## 2020-09-07 ENCOUNTER — Emergency Department (HOSPITAL_COMMUNITY)
Admission: EM | Admit: 2020-09-07 | Discharge: 2020-09-08 | Disposition: A | Discharge status: Home / Self Care | Payer: Self-pay | Attending: Emergency Medicine | Admitting: Emergency Medicine

## 2020-09-07 ENCOUNTER — Emergency Department (HOSPITAL_COMMUNITY): Payer: Self-pay

## 2020-09-07 DIAGNOSIS — Z794 Long term (current) use of insulin: Secondary | ICD-10-CM | POA: Insufficient documentation

## 2020-09-07 DIAGNOSIS — Z20822 Contact with and (suspected) exposure to covid-19: Secondary | ICD-10-CM | POA: Insufficient documentation

## 2020-09-07 DIAGNOSIS — M549 Dorsalgia, unspecified: Secondary | ICD-10-CM

## 2020-09-07 DIAGNOSIS — F17210 Nicotine dependence, cigarettes, uncomplicated: Secondary | ICD-10-CM | POA: Insufficient documentation

## 2020-09-07 DIAGNOSIS — R1031 Right lower quadrant pain: Secondary | ICD-10-CM | POA: Insufficient documentation

## 2020-09-07 DIAGNOSIS — R1032 Left lower quadrant pain: Secondary | ICD-10-CM | POA: Insufficient documentation

## 2020-09-07 DIAGNOSIS — M545 Low back pain, unspecified: Secondary | ICD-10-CM

## 2020-09-07 DIAGNOSIS — E1110 Type 2 diabetes mellitus with ketoacidosis without coma: Secondary | ICD-10-CM | POA: Insufficient documentation

## 2020-09-07 LAB — URINALYSIS, ROUTINE W REFLEX MICROSCOPIC
Bilirubin Urine: NEGATIVE
Glucose, UA: >=500 mg/dL — AB
Hgb urine dipstick: NEGATIVE
Ketones, ur: NEGATIVE mg/dL
Leukocytes,Ua: NEGATIVE
Nitrite: NEGATIVE
Protein, ur: NEGATIVE mg/dL
Specific Gravity, Urine: 1.026 (ref 1.005–1.030)
pH: 5 (ref 5.0–8.0)

## 2020-09-07 LAB — BASIC METABOLIC PANEL
Anion gap: 12 (ref 5–15)
BUN: 12 mg/dL (ref 6–20)
CO2: 22 mmol/L (ref 22–32)
Calcium: 9.5 mg/dL (ref 8.9–10.3)
Chloride: 102 mmol/L (ref 98–111)
Creatinine, Ser: 0.56 mg/dL (ref 0.44–1.00)
GFR calc Af Amer: >60 mL/min (ref >60)
GFR calc non Af Amer: >60 mL/min (ref >60)
Glucose, Bld: 206 mg/dL — ABNORMAL HIGH (ref 70–99)
Potassium: 4 mmol/L (ref 3.5–5.1)
Sodium: 136 mmol/L (ref 135–145)

## 2020-09-07 LAB — CBC
HCT: 45 % (ref 36.0–46.0)
Hemoglobin: 14.8 g/dL (ref 12.0–15.0)
MCH: 30.6 pg (ref 26.0–34.0)
MCHC: 32.9 g/dL (ref 30.0–36.0)
MCV: 93 fL (ref 80.0–100.0)
Platelets: 276 10*3/uL (ref 150–400)
RBC: 4.84 MIL/uL (ref 3.87–5.11)
RDW: 13 % (ref 11.5–15.5)
WBC: 9.3 10*3/uL (ref 4.0–10.5)
nRBC: 0 % (ref 0.0–0.2)

## 2020-09-07 LAB — CBG MONITORING, ED: Glucose-Capillary: 267 mg/dL — ABNORMAL HIGH (ref 70–99)

## 2020-09-07 NOTE — ED Triage Notes (Signed)
Flank pain x3 days. Concerned for DKA.

## 2020-09-07 NOTE — ED Provider Notes (Signed)
[facility] EMERGENCY DEPARTMENT Provider Note   CSN: [id number] Arrival date & time: 09/07/20  1935   Time seen 11:12 PM  History Chief Complaint  Patient presents with  . Flank Pain    Alexandria Mejia is a 49 y.o. female.  HPI   Patient states 3 to 4 days ago she started having bilateral lower flank pain that has been constant for the past day.  She states it "feels like prior kidney stones".  She describes the pain as "scraping".  She denies nausea, vomiting, diarrhea, hematuria, frequency, or fever.  She has had some dysuria.  She denies any change in her activity.  She states she feels weak and has had no energy for 4 days.  She has a dry cough without sore throat, rhinorrhea, loss of taste or smell.  She states her grandmother had Covid about 9 days ago and she was exposed to her.   Patient has not had the Covid vaccine  PCP Freddy Finner, NP   Past Medical History:  Diagnosis Date  . Back pain   . Candidiasis, intertrigo 09/17/2012  . Complication of anesthesia    patient states "with last surgery I was hard to wake up".  . Diabetes mellitus without complication (HCC)   . DKA (diabetic ketoacidoses) (HCC)   . DKA (diabetic ketoacidoses) (HCC) 05/28/2019  . GERD (gastroesophageal reflux disease)   . H/O repair of right rotator cuff 10/24/2014  . Hypotension   . Migraine   . Partial tear of rotator cuff 03/01/2014  . Rotator cuff tear, right 02/14/2014  . Shoulder injury 02/14/2014  . Shoulder pain 09/17/2012    Patient Active Problem List   Diagnosis Date Noted  . Vertigo 03/29/2020  . Varicose veins of left leg with edema 09/17/2019  . Diabetes mellitus (HCC) 07/20/2019  . Pain of left lower extremity 07/20/2019  . Abdominal wall abscess 05/29/2019  . GERD (gastroesophageal reflux disease)   . Cellulitis   . Leukocytosis   . Stress 09/17/2012  . Class 1 obesity due to excess calories with serious comorbidity and body mass index (BMI) of 32.0 to 32.9 in adult  08/04/2012  . Chronic back pain 08/04/2012  . Nicotine abuse 08/04/2012    Past Surgical History:  Procedure Laterality Date  . APPENDECTOMY    . BACK SURGERY     lumbar-herniated disc  . CHOLECYSTECTOMY    . FLEXOR TENOTOMY Right 10/12/2014   Procedure: BICEPS TENOTOMY;  Surgeon: Vickki Hearing, MD;  Location: [facility];  Service: Orthopedics;  Laterality: Right;  . NECK SURGERY     decompression of 7 disc  . SHOULDER ARTHROSCOPY WITH ROTATOR CUFF REPAIR Right 05/27/2014   Procedure: SHOULDER ARTHROSCOPY WITH ROTATOR CUFF REPAIR, subscalpularis repair, open supraspinatus repair;  Surgeon: Vickki Hearing, MD;  Location: [facility];  Service: Orthopedics;  Laterality: Right;  . SHOULDER OPEN ROTATOR CUFF REPAIR Right 10/12/2014   Procedure: OPEN ROTATOR CUFF REPAIR SHOULDER;  Surgeon: Vickki Hearing, MD;  Location: [facility];  Service: Orthopedics;  Laterality: Right;  . ULNAR NERVE TRANSPOSITION Right 11/13/2015   Procedure: RIGHT ULNAR NERVE TRANSPOSITION;  Surgeon: Mack Hook, MD;  Location: [facility];  Service: Orthopedics;  Laterality: Right;     OB History   No obstetric history on file.     Family History  Problem Relation Age of Onset  . COPD Mother   . Arthritis Mother   . Diabetes Father   . Heart disease Father   .  Hypertension Brother   . Diabetes Paternal Grandmother   . Diabetes Maternal Grandmother   . Dementia Paternal Grandfather     Social History   Tobacco Use  . Smoking status: Current Some Day Smoker    Packs/day: 0.20    Years: 30.00    Pack years: 6.00    Types: Cigarettes  . Smokeless tobacco: Never Used  Vaping Use  . Vaping Use: Never used  Substance Use Topics  . Alcohol use: Yes    Comment: occ  . Drug use: No    Home Medications Prior to Admission medications   Medication Sig Start Date End Date Taking? Authorizing Provider  cetirizine (ZYRTEC) 10 MG tablet Take 1 tablet (10 mg total) by mouth daily. 04/20/20    Wurst, [place], PA-C  fluticasone (FLONASE) 50 MCG/ACT nasal spray Place 2 sprays into both nostrils daily. 04/20/20   Wurst, [place], PA-C  ibuprofen (ADVIL) 200 MG tablet Take 800 mg by mouth every 8 (eight) hours as needed for moderate pain.     [provider]  insulin NPH-regular Human (70-30) 100 UNIT/ML injection Inject 20 Units into the skin 2 (two) times daily with a meal. OR AS DIRECTED.  REDUCE DOSE BY 5 UNITS IF YOU HAVE ANY BLOOD SUGAR LESS THAN 100. 04/25/20   Freddy Finner, NP  meclizine (ANTIVERT) 25 MG tablet Take 1 tablet (25 mg total) by mouth 3 (three) times daily as needed for dizziness. 03/30/20   Cleora Fleet, MD  metFORMIN (GLUCOPHAGE) 1000 MG tablet Take 1 tablet (1,000 mg total) by mouth 2 (two) times daily with a meal. 08/23/20   Freddy Finner, NP  methocarbamol (ROBAXIN) 750 MG tablet Take 1 or 2 po Q 6hrs for muscle pain 09/08/20   Devoria Albe, MD  naproxen (NAPROSYN) 500 MG tablet Take 1 po BID with food prn pain 09/08/20   Devoria Albe, MD  omeprazole (PRILOSEC) 20 MG capsule Take 20 mg by mouth daily.    [provider]    Allergies    Codeine and Sulfa antibiotics  Review of Systems   Review of Systems  All other systems reviewed and are negative.   Physical Exam Updated Vital Signs BP 114/77 (BP Location: Left Arm)   Pulse 72   Temp 99 F (37.2 C)   Resp 18   Ht 5\' 9"  (1.753 m)   Wt 90.7 kg   SpO2 98%   BMI 29.53 kg/m   Physical Exam Vitals and nursing note reviewed.  Constitutional:      General: She is not in acute distress.    Appearance: Normal appearance. She is obese. She is not ill-appearing or toxic-appearing.     Comments: Patient is lying quietly in her stretcher in no apparent distress  HENT:     Head: Normocephalic and atraumatic.  Eyes:     Extraocular Movements: Extraocular movements intact.     Conjunctiva/sclera: Conjunctivae normal.     Pupils: Pupils are equal, round, and reactive to light.    Cardiovascular:     Rate and Rhythm: Normal rate and regular rhythm.     Pulses: Normal pulses.     Heart sounds: No murmur heard.   Pulmonary:     Effort: Pulmonary effort is normal. No respiratory distress.     Breath sounds: Normal breath sounds. No stridor. No wheezing, rhonchi or rales.  Abdominal:     General: Abdomen is flat. Bowel sounds are normal.     Palpations: Abdomen  is soft.     Tenderness: There is no abdominal tenderness. There is right CVA tenderness and left CVA tenderness.  Musculoskeletal:        General: Normal range of motion.     Cervical back: Normal range of motion.  Skin:    General: Skin is warm and dry.     Findings: No erythema.  Neurological:     General: No focal deficit present.     Mental Status: She is alert and oriented to person, place, and time.     Cranial Nerves: No cranial nerve deficit.  Psychiatric:        Mood and Affect: Mood normal.        Behavior: Behavior normal.        Thought Content: Thought content normal.     ED Results / Procedures / Treatments   Labs (all labs ordered are listed, but only abnormal results are displayed) Results for orders placed or performed during the hospital encounter of 09/07/20  Respiratory Panel by RT PCR (Flu A&B, Covid) -  Result Value Ref Range   SARS Coronavirus 2 by RT PCR NEGATIVE NEGATIVE   Influenza A by PCR NEGATIVE NEGATIVE   Influenza B by PCR NEGATIVE NEGATIVE  Urinalysis, Routine w reflex microscopic Urine, Clean Catch  Result Value Ref Range   Color, Urine YELLOW YELLOW   APPearance HAZY (A) CLEAR   Specific Gravity, Urine 1.026 1.005 - 1.030   pH 5.0 5.0 - 8.0   Glucose, UA >=500 (A) NEGATIVE mg/dL   Hgb urine dipstick NEGATIVE NEGATIVE   Bilirubin Urine NEGATIVE NEGATIVE   Ketones, ur NEGATIVE NEGATIVE mg/dL   Protein, ur NEGATIVE NEGATIVE mg/dL   Nitrite NEGATIVE NEGATIVE   Leukocytes,Ua NEGATIVE NEGATIVE   RBC / HPF 0-5 0 - 5 RBC/hpf   WBC, UA 0-5 0 - 5 WBC/hpf    Bacteria, UA MANY (A) NONE SEEN   Squamous Epithelial / LPF 0-5 0 - 5   Mucus PRESENT    Hyaline Casts, UA PRESENT    Ca Oxalate Crys, UA PRESENT   Basic metabolic panel  Result Value Ref Range   Sodium 136 135 - 145 mmol/L   Potassium 4.0 3.5 - 5.1 mmol/L   Chloride 102 98 - 111 mmol/L   CO2 22 22 - 32 mmol/L   Glucose, Bld 206 (H) 70 - 99 mg/dL   BUN 12 6 - 20 mg/dL   Creatinine, Ser 4.090.56 0.44 - 1.00 mg/dL   Calcium 9.5 8.9 - 81.110.3 mg/dL   GFR calc non Af Amer >60 >60 mL/min   GFR calc Af Amer >60 >60 mL/min   Anion gap 12 5 - 15  CBC  Result Value Ref Range   WBC 9.3 4.0 - 10.5 K/uL   RBC 4.84 3.87 - 5.11 MIL/uL   Hemoglobin 14.8 12.0 - 15.0 g/dL   HCT 91.445.0 36 - 46 %   MCV 93.0 80.0 - 100.0 fL   MCH 30.6 26.0 - 34.0 pg   MCHC 32.9 30.0 - 36.0 g/dL   RDW 78.213.0 95.611.5 - 21.315.5 %   Platelets 276 150 - 400 K/uL   nRBC 0.0 0.0 - 0.2 %  CBG monitoring, ED  Result Value Ref Range   Glucose-Capillary 267 (H) 70 - 99 mg/dL   Laboratory interpretation all normal except nonfasting hyperglycemia, glucosuria    EKG None  Radiology DG Chest Port 1 View  Result Date: 09/07/2020 CLINICAL DATA:  Dry cough, exposure to COVID  EXAM: PORTABLE CHEST 1 VIEW COMPARISON:  Chest x-ray 10/10/2019, CT chest 02/05/2015 FINDINGS: The heart size and mediastinal contours are within normal limits. No focal consolidation. No pulmonary edema. No pleural effusion. No pneumothorax. No acute osseous abnormality. Right shoulder rotator cuff anchor suture. IMPRESSION: No active disease. Electronically Signed   By: Tish Frederickson M.D.   On: 09/07/2020 23:50    Procedures Procedures (including critical care time)  Medications Ordered in ED Medications - No data to display  ED Course  I have reviewed the triage vital signs and the nursing notes.  Pertinent labs & imaging results that were available during my care of the patient were reviewed by me and considered in my medical decision making (see chart  for details).    MDM Rules/Calculators/A&P                           Patient was tested for Covid.  Portable chest x-ray was done due to her mild dry cough.  I was not contemplating doing a CT to look for renal stones however she does not appear to be in distress enough to have a kidney stone.  While she is having bilateral pain, it be very unusual to have bilateral kidney stones passing at the same time.  Patient has had no nausea either which usually goes along with kidney stones.  Recheck at 12:50 AM patient states she is having lower flank pain bilaterally, it hurts when she moves and changes positions.  Think she is having musculoskeletal pain.  Patient was discharged home.  She can use ice and heat for comfort, she was discharged home on naproxen and Robaxin.  She can follow-up with her primary care doctor if she is not improving.   Final Clinical Impression(s) / ED Diagnoses Final diagnoses:  Musculoskeletal back pain  Acute bilateral low back pain without sciatica    Rx / DC Orders ED Discharge Orders         Ordered    naproxen (NAPROSYN) 500 MG tablet        09/08/20 0058    methocarbamol (ROBAXIN) 750 MG tablet        09/08/20 0058         Plan discharge  Devoria Albe, MD, Concha Pyo, MD 09/08/20 [phone]

## 2020-09-08 LAB — RESPIRATORY PANEL BY RT PCR (FLU A&B, COVID)
Influenza A by PCR: NEGATIVE
Influenza B by PCR: NEGATIVE
SARS Coronavirus 2 by RT PCR: NEGATIVE

## 2020-09-08 MED ORDER — METHOCARBAMOL 750 MG PO TABS
ORAL_TABLET | ORAL | 0 refills | Status: AC
Start: 2020-09-08 — End: ?

## 2020-09-08 MED ORDER — NAPROXEN 500 MG PO TABS
ORAL_TABLET | ORAL | 0 refills | Status: DC
Start: 2020-09-08 — End: 2021-02-20

## 2020-09-08 NOTE — Discharge Instructions (Signed)
Use ice and heat for comfort.  Take the medications as prescribed.  Follow-up with your family doctor if not improving over the next week.  Recheck sooner if you get fever, vomiting, or see blood in your urine.

## 2020-09-09 LAB — URINE CULTURE

## 2020-10-21 ENCOUNTER — Other Ambulatory Visit: Payer: Self-pay | Admitting: Family Medicine

## 2020-12-21 ENCOUNTER — Other Ambulatory Visit: Payer: Self-pay | Admitting: Family Medicine

## 2020-12-21 DIAGNOSIS — E119 Type 2 diabetes mellitus without complications: Secondary | ICD-10-CM

## 2021-02-10 ENCOUNTER — Emergency Department (HOSPITAL_COMMUNITY)
Admission: EM | Admit: 2021-02-10 | Discharge: 2021-02-10 | Disposition: A | Discharge status: Home / Self Care | Payer: Self-pay | Attending: Emergency Medicine | Admitting: Emergency Medicine

## 2021-02-10 ENCOUNTER — Emergency Department (HOSPITAL_COMMUNITY): Payer: Self-pay

## 2021-02-10 ENCOUNTER — Encounter (HOSPITAL_COMMUNITY): Payer: Self-pay

## 2021-02-10 ENCOUNTER — Other Ambulatory Visit: Payer: Self-pay

## 2021-02-10 DIAGNOSIS — M79671 Pain in right foot: Secondary | ICD-10-CM | POA: Insufficient documentation

## 2021-02-10 DIAGNOSIS — Z7984 Long term (current) use of oral hypoglycemic drugs: Secondary | ICD-10-CM | POA: Insufficient documentation

## 2021-02-10 DIAGNOSIS — E1110 Type 2 diabetes mellitus with ketoacidosis without coma: Secondary | ICD-10-CM | POA: Insufficient documentation

## 2021-02-10 DIAGNOSIS — F17210 Nicotine dependence, cigarettes, uncomplicated: Secondary | ICD-10-CM | POA: Insufficient documentation

## 2021-02-10 DIAGNOSIS — Z794 Long term (current) use of insulin: Secondary | ICD-10-CM | POA: Insufficient documentation

## 2021-02-10 MED ORDER — HYDROCODONE-ACETAMINOPHEN 5-325 MG PO TABS
1.0000 | ORAL_TABLET | Freq: Once | ORAL | Status: AC
  Administered 2021-02-10: 1 via ORAL
  Filled 2021-02-10: qty 1

## 2021-02-10 NOTE — Discharge Instructions (Addendum)
I recommend a combination of tylenol and ibuprofen for management of your pain. You can take a low dose of both at the same time. I recommend 650 mg of Tylenol combined with 800 mg of ibuprofen. This is two regular Tylenol and four regular ibuprofen. You can take these 2-3 times for day for your pain. Please try to take these medications with a small amount of food as well to prevent upsetting your stomach.  I would continue applying ice to the foot as well.  I have placed you in a cam boot today.  Continue wearing this throughout the day while you are walking.  This will help take weight and stress of the right foot and hopefully minimize your pain.  Below is the contact information for a local podiatrist.  Please give them a call as soon as possible to schedule a follow-up appointment.  If your symptoms worsen, you can always return to the emergency department for reevaluation.  It was a pleasure to meet you.

## 2021-02-10 NOTE — ED Notes (Signed)
CAM boot applied pt educated

## 2021-02-10 NOTE — ED Provider Notes (Signed)
[facility] EMERGENCY DEPARTMENT Provider Note   CSN: [id number] Arrival date & time: 02/10/21  1934     History Chief Complaint  Patient presents with  . Foot Pain    Right inferior foot    Alexandria Mejia is a 50 y.o. female.  HPI Patient is a 50 year old female with a medical history as noted below.  About 1 week ago patient began experiencing right plantar foot pain.  Pain worsens with ambulation.  No trauma to the foot.  Patient does note being diabetic but states she is on insulin and her blood sugars have been well controlled around the 140s.  No history of diabetic neuropathy.  Has been taking 800 mg ibuprofen at home without relief.  No numbness or weakness.  But does note that she is having burning pain that radiates from the bottom up her foot through the toes of her foot when it worsens.    Past Medical History:  Diagnosis Date  . Back pain   . Candidiasis, intertrigo 09/17/2012  . Complication of anesthesia    patient states "with last surgery I was hard to wake up".  . Diabetes mellitus without complication (HCC)   . DKA (diabetic ketoacidoses)   . DKA (diabetic ketoacidoses) 05/28/2019  . GERD (gastroesophageal reflux disease)   . H/O repair of right rotator cuff 10/24/2014  . Hypotension   . Migraine   . Partial tear of rotator cuff 03/01/2014  . Rotator cuff tear, right 02/14/2014  . Shoulder injury 02/14/2014  . Shoulder pain 09/17/2012    Patient Active Problem List   Diagnosis Date Noted  . Vertigo 03/29/2020  . Varicose veins of left leg with edema 09/17/2019  . Diabetes mellitus (HCC) 07/20/2019  . Pain of left lower extremity 07/20/2019  . Abdominal wall abscess 05/29/2019  . GERD (gastroesophageal reflux disease)   . Cellulitis   . Leukocytosis   . Stress 09/17/2012  . Class 1 obesity due to excess calories with serious comorbidity and body mass index (BMI) of 32.0 to 32.9 in adult 08/04/2012  . Chronic back pain 08/04/2012  . Nicotine abuse  08/04/2012    Past Surgical History:  Procedure Laterality Date  . APPENDECTOMY    . BACK SURGERY     lumbar-herniated disc  . CHOLECYSTECTOMY    . FLEXOR TENOTOMY Right 10/12/2014   Procedure: BICEPS TENOTOMY;  Surgeon: Vickki Hearing, MD;  Location: [facility];  Service: Orthopedics;  Laterality: Right;  . NECK SURGERY     decompression of 7 disc  . SHOULDER ARTHROSCOPY WITH ROTATOR CUFF REPAIR Right 05/27/2014   Procedure: SHOULDER ARTHROSCOPY WITH ROTATOR CUFF REPAIR, subscalpularis repair, open supraspinatus repair;  Surgeon: Vickki Hearing, MD;  Location: [facility];  Service: Orthopedics;  Laterality: Right;  . SHOULDER OPEN ROTATOR CUFF REPAIR Right 10/12/2014   Procedure: OPEN ROTATOR CUFF REPAIR SHOULDER;  Surgeon: Vickki Hearing, MD;  Location: [facility];  Service: Orthopedics;  Laterality: Right;  . ULNAR NERVE TRANSPOSITION Right 11/13/2015   Procedure: RIGHT ULNAR NERVE TRANSPOSITION;  Surgeon: Mack Hook, MD;  Location: [facility];  Service: Orthopedics;  Laterality: Right;     OB History   No obstetric history on file.     Family History  Problem Relation Age of Onset  . COPD Mother   . Arthritis Mother   . Diabetes Father   . Heart disease Father   . Hypertension Brother   . Diabetes Paternal Grandmother   . Diabetes Maternal  Grandmother   . Dementia Paternal Grandfather     Social History   Tobacco Use  . Smoking status: Current Some Day Smoker    Packs/day: 0.20    Years: 30.00    Pack years: 6.00    Types: Cigarettes  . Smokeless tobacco: Never Used  Vaping Use  . Vaping Use: Never used  Substance Use Topics  . Alcohol use: Yes    Comment: occ  . Drug use: No    Home Medications Prior to Admission medications   Medication Sig Start Date End Date Taking? Authorizing Provider  cetirizine (ZYRTEC) 10 MG tablet Take 1 tablet (10 mg total) by mouth daily. 04/20/20   Wurst, [place], PA-C  fluticasone (FLONASE) 50 MCG/ACT  nasal spray Place 2 sprays into both nostrils daily. 04/20/20   Wurst, [place], PA-C  ibuprofen (ADVIL) 200 MG tablet Take 800 mg by mouth every 8 (eight) hours as needed for moderate pain.     [provider]  insulin NPH-regular Human (70-30) 100 UNIT/ML injection Inject 20 Units into the skin 2 (two) times daily with a meal. OR AS DIRECTED.  REDUCE DOSE BY 5 UNITS IF YOU HAVE ANY BLOOD SUGAR LESS THAN 100. 04/25/20   Freddy Finner, NP  meclizine (ANTIVERT) 25 MG tablet Take 1 tablet (25 mg total) by mouth 3 (three) times daily as needed for dizziness. 03/30/20   Johnson, Clanford L, MD  metFORMIN (GLUCOPHAGE) 1000 MG tablet TAKE 1 TABLET BY MOUTH TWICE DAILY WITH A MEAL 12/25/20   Kerri Perches, MD  methocarbamol (ROBAXIN) 750 MG tablet Take 1 or 2 po Q 6hrs for muscle pain 09/08/20   Devoria Albe, MD  naproxen (NAPROSYN) 500 MG tablet Take 1 po BID with food prn pain 09/08/20   Devoria Albe, MD  omeprazole (PRILOSEC) 20 MG capsule Take 20 mg by mouth daily.    [provider]    Allergies    Codeine and Sulfa antibiotics  Review of Systems   Review of Systems  Constitutional: Negative for chills and fever.  Gastrointestinal: Negative for nausea and vomiting.  Musculoskeletal: Positive for myalgias.   Physical Exam Updated Vital Signs BP 96/83 (BP Location: Right Arm)   Pulse 71   Temp 97.7 F (36.5 C) (Oral)   Resp 18   Ht 5\' 9"  (1.753 m)   Wt 99.8 kg   SpO2 100%   BMI 32.49 kg/m   Physical Exam Vitals and nursing note reviewed.  Constitutional:      General: She is not in acute distress.    Appearance: She is well-developed.  HENT:     Head: Normocephalic and atraumatic.     Right Ear: External ear normal.     Left Ear: External ear normal.  Eyes:     General: No scleral icterus.       Right eye: No discharge.        Left eye: No discharge.     Conjunctiva/sclera: Conjunctivae normal.  Neck:     Trachea: No tracheal deviation.  Cardiovascular:      Rate and Rhythm: Normal rate.  Pulmonary:     Effort: Pulmonary effort is normal. No respiratory distress.     Breath sounds: No stridor.  Abdominal:     General: There is no distension.  Musculoskeletal:        General: Tenderness present. No swelling, deformity or signs of injury.     Cervical back: Neck supple.     Right lower  leg: No edema.     Left lower leg: No edema.     Comments: Moderate TTP noted along the plantar aspect of the right second MTP.  Pain worsens with palpation and radiates to the second and third toes of the right foot as well as through the plantar aspect of the right foot.  Distal sensation intact.  Good cap refill.  No erythema or edema noted.  No overlying skin changes.  No signs of infection.  2+ pedal pulses.  Ambulatory with an antalgic gait.  Skin:    General: Skin is warm and dry.     Findings: No rash.  Neurological:     Mental Status: She is alert.     Cranial Nerves: Cranial nerve deficit: no gross deficits.    ED Results / Procedures / Treatments   Labs (all labs ordered are listed, but only abnormal results are displayed) Labs Reviewed - No data to display  EKG None  Radiology DG Foot Complete Right  Result Date: 02/10/2021 CLINICAL DATA:  Second MTP pain EXAM: RIGHT FOOT COMPLETE - 3+ VIEW COMPARISON:  None. FINDINGS: There is no evidence of fracture or dislocation. There is a hallux valgus deformity with a first MTP joint osteoarthritis with joint space loss and marginal osteophyte formation. There is also osteoarthritis at the second and third cuneiform metatarsal joints. Mild dorsal soft tissue swelling is seen. IMPRESSION: First MTP and midfoot osteoarthritis. No definite acute osseous abnormality. Electronically Signed   By: Jonna Clark M.D.   On: 02/10/2021 21:07   Procedures Procedures   Medications Ordered in ED Medications  HYDROcodone-acetaminophen (NORCO/VICODIN) 5-325 MG per tablet 1 tablet (1 tablet Oral Given 02/10/21 2039)    ED Course  I have reviewed the triage vital signs and the nursing notes.  Pertinent labs & imaging results that were available during my care of the patient were reviewed by me and considered in my medical decision making (see chart for details).    MDM Rules/Calculators/A&P                          Patient is a 50 year old female who presents the emergency department with moderate pain to the plantar aspect of the right second MTP.  No overlying skin changes.  No increased warmth.  Neurovascularly intact in the right foot and toes.  Does not appear to be any signs of infection.  No involvement of the great toe.  No swelling noted.  Does not appear to be consistent with gout.  The pain appears to be burning and radiates from the site.  Possible neuroma?  X-rays were reassuring.  Patient given a Vicodin in the emergency department for pain.  She notes mild relief of her symptoms.  Recommended OTC medications for management of her symptoms.  We discussed dosing.  Patient placed in a cam boot.  Given podiatry follow-up.  Discussed return precautions.  Her questions were answered and she was amicable at the time of discharge.  Final Clinical Impression(s) / ED Diagnoses Final diagnoses:  Right foot pain   Rx / DC Orders ED Discharge Orders    None       Placido Sou, PA-C 02/10/21 2135    Eber Hong, MD 02/11/21 [phone]

## 2021-02-10 NOTE — ED Triage Notes (Signed)
Pt arrives from home via POV c/o inferior right foot pain. Pt reports being a diabetic and reporting pain radiating to top of foot. Pt reports difficulty walking, taking 800mg  Ibuprofen at home without relief.

## 2021-02-20 ENCOUNTER — Other Ambulatory Visit: Payer: Self-pay

## 2021-02-20 ENCOUNTER — Encounter: Payer: Self-pay | Admitting: Emergency Medicine

## 2021-02-20 ENCOUNTER — Ambulatory Visit
Admission: EM | Admit: 2021-02-20 | Discharge: 2021-02-20 | Disposition: A | Discharge status: Home / Self Care | Payer: Self-pay | Attending: Emergency Medicine | Admitting: Emergency Medicine

## 2021-02-20 DIAGNOSIS — K0889 Other specified disorders of teeth and supporting structures: Secondary | ICD-10-CM

## 2021-02-20 DIAGNOSIS — R519 Headache, unspecified: Secondary | ICD-10-CM

## 2021-02-20 MED ORDER — NAPROXEN 500 MG PO TABS
500.0000 mg | ORAL_TABLET | Freq: Two times a day (BID) | ORAL | 0 refills | Status: DC
Start: 2021-02-20 — End: 2023-03-30

## 2021-02-20 MED ORDER — CHLORHEXIDINE GLUCONATE 0.12 % MT SOLN: 15.0000 mL | Freq: Two times a day (BID) | OROMUCOSAL | 0 refills | Status: AC

## 2021-02-20 MED ORDER — AMOXICILLIN-POT CLAVULANATE 875-125 MG PO TABS: 1.0000 | ORAL_TABLET | Freq: Two times a day (BID) | ORAL | 0 refills | Status: AC

## 2021-02-20 NOTE — ED Provider Notes (Addendum)
[facility]   [id number] 02/20/21 Arrival Time: 1011  CC: DENTAL PAIN  SUBJECTIVE:  Alexandria Mejia is a 50 y.o. female who presents with a complaint of right facial pain that started last week.  She has a dental pain last week that self improve.  Developed worsening symptoms after a refrigerator hit her on her face this weekend.  Localizes pain to right side of her face.  Has tried OTC analgesics without relief.  Worse with chewing.  Report similar symptoms in the past.  Denies fever, chills, dysphagia, odynophagia, oral or neck swelling, nausea, vomiting, chest pain, SOB.    ROS: As per HPI.  All other pertinent ROS negative.     Past Medical History:  Diagnosis Date  . Back pain   . Candidiasis, intertrigo 09/17/2012  . Complication of anesthesia    patient states "with last surgery I was hard to wake up".  . Diabetes mellitus without complication (HCC)   . DKA (diabetic ketoacidoses)   . DKA (diabetic ketoacidoses) 05/28/2019  . GERD (gastroesophageal reflux disease)   . H/O repair of right rotator cuff 10/24/2014  . Hypotension   . Migraine   . Partial tear of rotator cuff 03/01/2014  . Rotator cuff tear, right 02/14/2014  . Shoulder injury 02/14/2014  . Shoulder pain 09/17/2012   Past Surgical History:  Procedure Laterality Date  . APPENDECTOMY    . BACK SURGERY     lumbar-herniated disc  . CHOLECYSTECTOMY    . FLEXOR TENOTOMY Right 10/12/2014   Procedure: BICEPS TENOTOMY;  Surgeon: Vickki Hearing, MD;  Location: [facility];  Service: Orthopedics;  Laterality: Right;  . NECK SURGERY     decompression of 7 disc  . SHOULDER ARTHROSCOPY WITH ROTATOR CUFF REPAIR Right 05/27/2014   Procedure: SHOULDER ARTHROSCOPY WITH ROTATOR CUFF REPAIR, subscalpularis repair, open supraspinatus repair;  Surgeon: Vickki Hearing, MD;  Location: [facility];  Service: Orthopedics;  Laterality: Right;  . SHOULDER OPEN ROTATOR CUFF REPAIR Right 10/12/2014   Procedure: OPEN ROTATOR CUFF  REPAIR SHOULDER;  Surgeon: Vickki Hearing, MD;  Location: [facility];  Service: Orthopedics;  Laterality: Right;  . ULNAR NERVE TRANSPOSITION Right 11/13/2015   Procedure: RIGHT ULNAR NERVE TRANSPOSITION;  Surgeon: Mack Hook, MD;  Location: [facility];  Service: Orthopedics;  Laterality: Right;   Allergies  Allergen Reactions  . Codeine Itching  . Sulfa Antibiotics Itching and Swelling   No current facility-administered medications on file prior to encounter.   Current Outpatient Medications on File Prior to Encounter  Medication Sig Dispense Refill  . cetirizine (ZYRTEC) 10 MG tablet Take 1 tablet (10 mg total) by mouth daily. 30 tablet 0  . fluticasone (FLONASE) 50 MCG/ACT nasal spray Place 2 sprays into both nostrils daily. 16 g 0  . ibuprofen (ADVIL) 200 MG tablet Take 800 mg by mouth every 8 (eight) hours as needed for moderate pain.     Marland Kitchen insulin NPH-regular Human (70-30) 100 UNIT/ML injection Inject 20 Units into the skin 2 (two) times daily with a meal. OR AS DIRECTED.  REDUCE DOSE BY 5 UNITS IF YOU HAVE ANY BLOOD SUGAR LESS THAN 100. 10 mL 1  . meclizine (ANTIVERT) 25 MG tablet Take 1 tablet (25 mg total) by mouth 3 (three) times daily as needed for dizziness. 30 tablet 0  . metFORMIN (GLUCOPHAGE) 1000 MG tablet TAKE 1 TABLET BY MOUTH TWICE DAILY WITH A MEAL 90 tablet 0  . methocarbamol (ROBAXIN) 750 MG tablet Take  1 or 2 po Q 6hrs for muscle pain 60 tablet 0  . omeprazole (PRILOSEC) 20 MG capsule Take 20 mg by mouth daily.     Social History   Socioeconomic History  . Marital status: Single    Spouse name: Not on file  . Number of children: Not on file  . Years of education: Not on file  . Highest education level: High school graduate  Occupational History  . Not on file  Tobacco Use  . Smoking status: Current Some Day Smoker    Packs/day: 0.20    Years: 30.00    Pack years: 6.00    Types: Cigarettes  . Smokeless tobacco: Never Used  Vaping Use  .  Vaping Use: Never used  Substance and Sexual Activity  . Alcohol use: Yes    Comment: occ  . Drug use: No  . Sexual activity: Yes    Birth control/protection: None    Comment: female partners only  Other Topics Concern  . Not on file  Social History Narrative   Living with mom   No pets      Enjoy: fish, karaoke-turn the page by bob seger, hanging with friends      Diet: overall good, drinks, extra sugar, chips, meat   Caffeine: not really drinking it   Water: 8 cups plus daily      Wears seatbelt   Wears Suncreen   Smoke detectors carbon monoxide   Does not use phone while driving            Social Determinants of Corporate investment banker Strain: Not on file  Food Insecurity: Not on file  Transportation Needs: Not on file  Physical Activity: Not on file  Stress: Not on file  Social Connections: Not on file  Intimate Partner Violence: Not on file   Family History  Problem Relation Age of Onset  . COPD Mother   . Arthritis Mother   . Diabetes Father   . Heart disease Father   . Hypertension Brother   . Diabetes Paternal Grandmother   . Diabetes Maternal Grandmother   . Dementia Paternal Grandfather     OBJECTIVE:  Vitals:   02/20/21 1031  BP: 110/73  Pulse: 69  Resp: 18  Temp: 98 F (36.7 C)  TempSrc: Oral  SpO2: 94%    Physical Exam Vitals and nursing note reviewed.  Constitutional:      General: She is not in acute distress.    Appearance: Normal appearance. She is normal weight. She is not ill-appearing, toxic-appearing or diaphoretic.  HENT:     Head: Normocephalic.     Right Ear: Tympanic membrane, ear canal and external ear normal. There is no impacted cerumen.     Left Ear: Tympanic membrane, ear canal and external ear normal. There is no impacted cerumen.     Ears:     Comments: Right facial pain present on palpation    Nose:     Right Sinus: No maxillary sinus tenderness or frontal sinus tenderness.     Left Sinus: No maxillary  sinus tenderness or frontal sinus tenderness.     Mouth/Throat:     Lips: Pink.     Mouth: Mucous membranes are moist.     Dentition: Dental tenderness present. No dental abscesses.  Cardiovascular:     Rate and Rhythm: Normal rate and regular rhythm.     Pulses: Normal pulses.     Heart sounds: Normal heart sounds. No murmur heard.  No friction rub. No gallop.   Pulmonary:     Effort: Pulmonary effort is normal. No respiratory distress.     Breath sounds: Normal breath sounds. No stridor. No wheezing, rhonchi or rales.  Chest:     Chest wall: No tenderness.  Neurological:     Mental Status: She is alert and oriented to person, place, and time.      ASSESSMENT & PLAN:  1. Facial pain   2. Pain, dental     Meds ordered this encounter  Medications  . naproxen (NAPROSYN) 500 MG tablet    Sig: Take 1 tablet (500 mg total) by mouth 2 (two) times daily.    Dispense:  30 tablet    Refill:  0  . chlorhexidine (PERIDEX) 0.12 % solution    Sig: Use as directed 15 mLs in the mouth or throat 2 (two) times daily.    Dispense:  120 mL    Refill:  0  . amoxicillin-clavulanate (AUGMENTIN) 875-125 MG tablet    Sig: Take 1 tablet by mouth every 12 (twelve) hours.    Dispense:  14 tablet    Refill:  0    Discharge instructions  Naproxen prescribed.  Use as directed for pain relief Augmentin and chlorhexidine was prescribed/take as directed Recommend soft diet until evaluated by dentist Maintain oral hygiene care Follow up with dentist as soon as possible for further evaluation and treatment  Return or go to the ED if you have any new or worsening symptoms such as fever, chills, difficulty swallowing, painful swallowing, oral or neck swelling, nausea, vomiting, chest pain, SOB, etc...  Reviewed expectations re: course of current medical issues. Questions answered. Outlined signs and symptoms indicating need for more acute intervention. Patient verbalized understanding. After Visit  Summary given.   Durward Parcel, FNP 02/20/21 1042    Durward Parcel, FNP 02/20/21 1042

## 2021-02-20 NOTE — ED Triage Notes (Signed)
Pain to RT side of face since last week.  Has been worse since she hit that side of face trying to move a refrigerator  this weekend

## 2021-02-20 NOTE — Discharge Instructions (Signed)
Naproxen prescribed.  Use as directed for pain relief Augmentin and chlorhexidine was prescribed/take as directed Recommend soft diet until evaluated by dentist Maintain oral hygiene care Follow up with dentist as soon as possible for further evaluation and treatment  Return or go to the ED if you have any new or worsening symptoms such as fever, chills, difficulty swallowing, painful swallowing, oral or neck swelling, nausea, vomiting, chest pain, SOB, etc..Marland Kitchen

## 2022-12-11 ENCOUNTER — Emergency Department (HOSPITAL_COMMUNITY)
Admission: EM | Admit: 2022-12-11 | Discharge: 2022-12-11 | Disposition: A | Discharge status: Home / Self Care | Payer: Self-pay

## 2023-02-13 ENCOUNTER — Encounter: Payer: Self-pay | Admitting: Radiology

## 2023-03-30 ENCOUNTER — Emergency Department (HOSPITAL_COMMUNITY)
Admission: EM | Admit: 2023-03-30 | Discharge: 2023-03-30 | Disposition: A | Discharge status: Home / Self Care | Payer: Self-pay | Attending: Emergency Medicine | Admitting: Emergency Medicine

## 2023-03-30 ENCOUNTER — Other Ambulatory Visit: Payer: Self-pay

## 2023-03-30 ENCOUNTER — Emergency Department (HOSPITAL_COMMUNITY): Payer: Self-pay

## 2023-03-30 ENCOUNTER — Encounter (HOSPITAL_COMMUNITY): Payer: Self-pay | Admitting: *Deleted

## 2023-03-30 DIAGNOSIS — S022XXA Fracture of nasal bones, initial encounter for closed fracture: Secondary | ICD-10-CM | POA: Insufficient documentation

## 2023-03-30 DIAGNOSIS — W208XXA Other cause of strike by thrown, projected or falling object, initial encounter: Secondary | ICD-10-CM | POA: Insufficient documentation

## 2023-03-30 MED ORDER — NAPROXEN 500 MG PO TABS: 500.0000 mg | ORAL_TABLET | Freq: Two times a day (BID) | ORAL | 0 refills | Status: AC

## 2023-03-30 NOTE — Discharge Instructions (Signed)
Your x-rays show that you do have a broken nose, this should heal without any difficulties but may cause some swelling and bruising around the eyes or the face as well as a bloody nose tonight.  You may take Naprosyn twice a day as needed for pain, use ice packs over the bridge of your nose for the next couple of days, if you are having severe problems with swelling or bruising you may need to see an ear nose and throat doctor but this should heal very well without any intervention.  It will not need surgery  Thank you for allowing [place] to treat you in the emergency department today.  After reviewing your examination and potential testing that was done it appears that you are safe to go home.  I would like for you to follow-up with your doctor within the next several days, have them obtain your results and follow-up with them to review all of these tests.  If you should develop severe or worsening symptoms return to the emergency department immediately  Please take Naprosyn, 500mg  by mouth twice daily as needed for pain - this in an antiinflammatory medicine (NSAID) and is similar to ibuprofen - many people feel that it is stronger than ibuprofen and it is easier to take since it is a smaller pill.  Please use this only for 1 week - if your pain persists, you will need to follow up with your doctor in the office for ongoing guidance and pain control.

## 2023-03-30 NOTE — ED Provider Notes (Signed)
[place] EMERGENCY DEPARTMENT AT [facility] Provider Note   CSN: [id number] Arrival date & time: 03/30/23  1653     History  Chief Complaint  Patient presents with   Facial Injury    Alexandria Mejia is a 52 y.o. female.   Facial Injury  This patient is a 52 year old patient who presents to the hospital today after developing a contusion to the face.  This occurred when they were using a transport dolly to move bagged sand, when the sand was dropped on the dolly it came up and struck the patient in the face causing injury to the nasal bridge.  There is a small amount of nosebleed, no loss of consciousness, mild headache, no numbness or weakness, no changes in vision.  This occurred just prior to arrival.    Home Medications Prior to Admission medications   Medication Sig Start Date End Date Taking? Authorizing Provider  naproxen (NAPROSYN) 500 MG tablet Take 1 tablet (500 mg total) by mouth 2 (two) times daily with a meal. 03/30/23  Yes Eber Hong, MD  amoxicillin-clavulanate (AUGMENTIN) 875-125 MG tablet Take 1 tablet by mouth every 12 (twelve) hours. 02/20/21   Avegno, Zachery Dakins, FNP  cetirizine (ZYRTEC) 10 MG tablet Take 1 tablet (10 mg total) by mouth daily. 04/20/20   Wurst, [place], PA-C  chlorhexidine (PERIDEX) 0.12 % solution Use as directed 15 mLs in the mouth or throat 2 (two) times daily. 02/20/21   Avegno, Zachery Dakins, FNP  fluticasone (FLONASE) 50 MCG/ACT nasal spray Place 2 sprays into both nostrils daily. 04/20/20   Wurst, [place], PA-C  ibuprofen (ADVIL) 200 MG tablet Take 800 mg by mouth every 8 (eight) hours as needed for moderate pain.     [provider]  insulin NPH-regular Human (70-30) 100 UNIT/ML injection Inject 20 Units into the skin 2 (two) times daily with a meal. OR AS DIRECTED.  REDUCE DOSE BY 5 UNITS IF YOU HAVE ANY BLOOD SUGAR LESS THAN 100. 04/25/20   Freddy Finner, NP  meclizine (ANTIVERT) 25 MG tablet Take 1 tablet (25 mg total)  by mouth 3 (three) times daily as needed for dizziness. 03/30/20   Johnson, Clanford L, MD  metFORMIN (GLUCOPHAGE) 1000 MG tablet TAKE 1 TABLET BY MOUTH TWICE DAILY WITH A MEAL 12/25/20   Kerri Perches, MD  methocarbamol (ROBAXIN) 750 MG tablet Take 1 or 2 po Q 6hrs for muscle pain 09/08/20   Devoria Albe, MD  omeprazole (PRILOSEC) 20 MG capsule Take 20 mg by mouth daily.    [provider]      Allergies    Codeine and Sulfa antibiotics    Review of Systems   Review of Systems  All other systems reviewed and are negative.   Physical Exam Updated Vital Signs BP 130/82 (BP Location: Left Arm)   Pulse 75   Temp 97.9 F (36.6 C)   Resp 18   Ht 1.753 m ( )   Wt 99.8 kg   SpO2 97%   BMI 32.49 kg/m  Physical Exam Vitals and nursing note reviewed.  Constitutional:      General: She is not in acute distress.    Appearance: She is well-developed.  HENT:     Head: Normocephalic.     Comments: Tenderness over the nasal bridge, no obvious deformity, no bleeding on the inside of the nose, no malocclusion    Mouth/Throat:     Pharynx: No oropharyngeal exudate.  Eyes:  General: No scleral icterus.       Right eye: No discharge.        Left eye: No discharge.     Conjunctiva/sclera: Conjunctivae normal.     Pupils: Pupils are equal, round, and reactive to light.  Neck:     Thyroid: No thyromegaly.     Vascular: No JVD.  Cardiovascular:     Rate and Rhythm: Normal rate and regular rhythm.     Heart sounds: Normal heart sounds. No murmur heard.    No friction rub. No gallop.  Pulmonary:     Effort: Pulmonary effort is normal. No respiratory distress.     Breath sounds: Normal breath sounds. No wheezing or rales.  Abdominal:     General: Bowel sounds are normal. There is no distension.     Palpations: Abdomen is soft. There is no mass.     Tenderness: There is no abdominal tenderness.  Musculoskeletal:        General: Tenderness present. Normal range of motion.      Cervical back: Normal range of motion and neck supple.     Right lower leg: No edema.     Left lower leg: No edema.  Lymphadenopathy:     Cervical: No cervical adenopathy.  Skin:    General: Skin is warm and dry.     Findings: No erythema or rash.  Neurological:     Mental Status: She is alert.     Coordination: Coordination normal.     Comments: Normal gait speech and strength in all 4 extremities, normal mental status and level of alertness  Psychiatric:        Behavior: Behavior normal.     ED Results / Procedures / Treatments   Labs (all labs ordered are listed, but only abnormal results are displayed) Labs Reviewed - No data to display  EKG None  Radiology CT Maxillofacial Wo Contrast  Result Date: 03/30/2023 CLINICAL DATA:  Facial trauma, blunt EXAM: CT MAXILLOFACIAL WITHOUT CONTRAST TECHNIQUE: Multidetector CT imaging of the maxillofacial structures was performed. Multiplanar CT image reconstructions were also generated. RADIATION DOSE REDUCTION: This exam was performed according to the departmental dose-optimization program which includes automated exposure control, adjustment of the mA and/or kV according to patient size and/or use of iterative reconstruction technique. COMPARISON:  CT head April 14, 21. FINDINGS: Osseous: Slightly displaced bilateral nasal bone fractures with overlying contusion. No other fractures identified. TMJs are low Orbits: Negative. No traumatic or inflammatory finding. Sinuses: Moderate right and mild left maxillary sinus mucosal thickening. Otherwise, sinuses are largely clear. Soft tissues: Nasal contusion. Limited intracranial: No evidence of acute abnormality in the visualized portions. IMPRESSION: Slightly displaced bilateral nasal bone fractures with overlying contusion. No other fractures identified. Electronically Signed   By: Feliberto Harts M.D.   On: 03/30/2023 18:53    Procedures Procedures    Medications Ordered in  ED Medications - No data to display  ED Course/ Medical Decision Making/ A&P                             Medical Decision Making Risk Prescription drug management.   I have personally examined the patient, taken the history and reviewed the imaging with the patient including the slightly displaced bilateral nasal bone fractures.  No other injuries, doubt intracranial hemorrhage given the mechanism and the patient's exam which is reassuring.  Stable for discharge on anti-inflammatory ice packs and follow-up with primary  care doctor, patient agreeable.        Final Clinical Impression(s) / ED Diagnoses Final diagnoses:  Closed fracture of nasal bone, initial encounter    Rx / DC Orders ED Discharge Orders          Ordered    naproxen (NAPROSYN) 500 MG tablet  2 times daily with meals        03/30/23 1909              Eber Hong, MD 03/30/23 1911

## 2023-03-30 NOTE — ED Triage Notes (Signed)
Pt states a hand truck came forward striking pt's face while placing sand bags on it PTA.  Pt with c/o facial pain and swelling.  + HA. Denies LOC. + dizziness

## 2023-03-30 NOTE — ED Provider Triage Note (Signed)
Emergency Medicine Provider Triage Evaluation Note  Alexandria Mejia , a 52 y.o. female  was evaluated in triage.  Pt complains of blunt facial trauma.  Patient states that a hand truck fell backwards hitting her in the face.  Denies loss of consciousness.  Does endorse epistaxis, left-sided facial swelling.  Review of Systems  Positive: As above Negative: As above  Physical Exam  BP 130/82 (BP Location: Left Arm)   Pulse 75   Temp 97.9 F (36.6 C)   Resp 18   Ht 5\' 9"  (1.753 m)   Wt 99.8 kg   SpO2 97%   BMI 32.49 kg/m  Gen:   Awake, no distress   Resp:  Normal effort  MSK:   Moves extremities without difficulty  Other:  Mild swelling noted to the left side of the face with some swelling around the left orbit  Medical Decision Making  Medically screening exam initiated at 6:09 PM.  Appropriate orders placed.  Alexandria Mejia was informed that the remainder of the evaluation will be completed by another provider, this initial triage assessment does not replace that evaluation, and the importance of remaining in the ED until their evaluation is complete.     Darrick Grinder, PA-C 03/30/23 1810

## 2023-07-26 ENCOUNTER — Emergency Department (HOSPITAL_COMMUNITY)
Admission: EM | Admit: 2023-07-26 | Discharge: 2023-07-26 | Disposition: A | Discharge status: Home / Self Care | Payer: Self-pay | Attending: Emergency Medicine | Admitting: Emergency Medicine

## 2023-07-26 ENCOUNTER — Other Ambulatory Visit: Payer: Self-pay

## 2023-07-26 DIAGNOSIS — H1089 Other conjunctivitis: Secondary | ICD-10-CM

## 2023-07-26 DIAGNOSIS — Z794 Long term (current) use of insulin: Secondary | ICD-10-CM | POA: Insufficient documentation

## 2023-07-26 DIAGNOSIS — Z7984 Long term (current) use of oral hypoglycemic drugs: Secondary | ICD-10-CM | POA: Insufficient documentation

## 2023-07-26 DIAGNOSIS — E119 Type 2 diabetes mellitus without complications: Secondary | ICD-10-CM | POA: Insufficient documentation

## 2023-07-26 DIAGNOSIS — H109 Unspecified conjunctivitis: Secondary | ICD-10-CM | POA: Insufficient documentation

## 2023-07-26 MED ORDER — KETOROLAC TROMETHAMINE 0.5 % OP SOLN
1.0000 [drp] | Freq: Once | OPHTHALMIC | Status: AC
  Administered 2023-07-26: 1 [drp] via OPHTHALMIC
  Filled 2023-07-26: qty 5

## 2023-07-26 MED ORDER — TETRACAINE HCL 0.5 % OP SOLN
2.0000 [drp] | Freq: Once | OPHTHALMIC | Status: AC
  Administered 2023-07-26: 2 [drp] via OPHTHALMIC
  Filled 2023-07-26: qty 4

## 2023-07-26 MED ORDER — ERYTHROMYCIN 5 MG/GM OP OINT
TOPICAL_OINTMENT | Freq: Once | OPHTHALMIC | Status: AC
  Filled 2023-07-26: qty 3.5

## 2023-07-26 MED ORDER — FLUORESCEIN SODIUM 1 MG OP STRP
1.0000 | ORAL_STRIP | Freq: Once | OPHTHALMIC | Status: AC
  Administered 2023-07-26: 1 via OPHTHALMIC
  Filled 2023-07-26: qty 1

## 2023-07-26 NOTE — ED Triage Notes (Signed)
Pt states felt something fly into right eye yesterday while weedeating grass. Pt flushed eye but unable to get relief. Pt states right eye has been watering all morning and very painful.

## 2023-07-26 NOTE — ED Triage Notes (Signed)
Pt also complains of headache behind eye.

## 2023-07-26 NOTE — ED Notes (Addendum)
Pt did visual acuity testing in front of nurses station. Right eye was 20/50 and left eye 20/40. Pt does also wear corrective lenses normally

## 2023-07-26 NOTE — ED Notes (Signed)
Placed tools at bedside.

## 2023-07-26 NOTE — ED Provider Triage Note (Signed)
Emergency Medicine Provider Triage Evaluation Note  Alexandria Mejia , a 52 y.o. female  was evaluated in triage.  Pt complains of persistent right eye pain and foreign body sensation after she used a weed eater yesterday.  She states she was waiting through some poison oak, something flew up and hit her goggles and hit her eye.  She has flushed the eye without significant improvement.  Also reports a headache on the same side.  Positive for light sensitivity.  Review of Systems  Positive: Eye pain, foreign body sensation Negative: Fevers, rash, changes in vision  Physical Exam  BP 120/72 (BP Location: Right Arm)   Pulse 61   Temp 98.7 F (37.1 C) (Oral)   Resp 18   Wt 99 kg   SpO2 98%   BMI 32.23 kg/m  Gen:   Awake, no distress   Resp:  Normal effort  MSK:   Moves extremities without difficulty  Other:    Medical Decision Making  Medically screening exam initiated at 2:14 PM.  Appropriate orders placed.  JAMIRIA HANNOLD was informed that the remainder of the evaluation will be completed by another provider, this initial triage assessment does not replace that evaluation, and the importance of remaining in the ED until their evaluation is complete.     Burgess Amor, PA-C 07/26/23 1419

## 2023-07-26 NOTE — ED Provider Notes (Signed)
[place] EMERGENCY DEPARTMENT AT [facility] Provider Note   CSN: [id number] Arrival date & time: 07/26/23  1207     History {Add pertinent medical, surgical, social history, OB history to HPI:1} Chief Complaint  Patient presents with  . Foreign Body in [place]  . Headache    Alexandria Mejia is a 52 y.o. female   The history is provided by the patient.       Home Medications Prior to Admission medications   Medication Sig Start Date End Date Taking? Authorizing Provider  amoxicillin-clavulanate (AUGMENTIN) 875-125 MG tablet Take 1 tablet by mouth every 12 (twelve) hours. 02/20/21   Avegno, Zachery Dakins, FNP  cetirizine (ZYRTEC) 10 MG tablet Take 1 tablet (10 mg total) by mouth daily. 04/20/20   Wurst, [place], PA-C  chlorhexidine (PERIDEX) 0.12 % solution Use as directed 15 mLs in the mouth or throat 2 (two) times daily. 02/20/21   Avegno, Zachery Dakins, FNP  fluticasone (FLONASE) 50 MCG/ACT nasal spray Place 2 sprays into both nostrils daily. 04/20/20   Wurst, [place], PA-C  ibuprofen (ADVIL) 200 MG tablet Take 800 mg by mouth every 8 (eight) hours as needed for moderate pain.     [provider]  insulin NPH-regular Human (70-30) 100 UNIT/ML injection Inject 20 Units into the skin 2 (two) times daily with a meal. OR AS DIRECTED.  REDUCE DOSE BY 5 UNITS IF YOU HAVE ANY BLOOD SUGAR LESS THAN 100. 04/25/20   Freddy Finner, NP  meclizine (ANTIVERT) 25 MG tablet Take 1 tablet (25 mg total) by mouth 3 (three) times daily as needed for dizziness. 03/30/20   Johnson, Clanford L, MD  metFORMIN (GLUCOPHAGE) 1000 MG tablet TAKE 1 TABLET BY MOUTH TWICE DAILY WITH A MEAL 12/25/20   Kerri Perches, MD  methocarbamol (ROBAXIN) 750 MG tablet Take 1 or 2 po Q 6hrs for muscle pain 09/08/20   Devoria Albe, MD  naproxen (NAPROSYN) 500 MG tablet Take 1 tablet (500 mg total) by mouth 2 (two) times daily with a meal. 03/30/23   Eber Hong, MD  omeprazole (PRILOSEC) 20 MG capsule Take 20 mg  by mouth daily.    [provider]      Allergies    Codeine and Sulfa antibiotics    Review of Systems   Review of Systems  Physical Exam Updated Vital Signs BP 120/72 (BP Location: Right Arm)   Pulse 61   Temp 98.7 F (37.1 C) (Oral)   Resp 18   Wt 99 kg   SpO2 98%   BMI 32.23 kg/m  Physical Exam  ED Results / Procedures / Treatments   Labs (all labs ordered are listed, but only abnormal results are displayed) Labs Reviewed - No data to display  EKG None  Radiology No results found.  Procedures Procedures  {Document cardiac monitor, telemetry assessment procedure when appropriate:1}  Medications Ordered in ED Medications  erythromycin ophthalmic ointment (has no administration in time range)  ketorolac (ACULAR) 0.5 % ophthalmic solution 1 drop (has no administration in time range)  fluorescein ophthalmic strip 1 strip (1 strip Right Eye Given 07/26/23 1649)  tetracaine (PONTOCAINE) 0.5 % ophthalmic solution 2 drop (2 drops Right Eye Given 07/26/23 1648)    ED Course/ Medical Decision Making/ A&P   {   Click here for ABCD2, HEART and other calculatorsREFRESH Note before signing :1}  Medical Decision Making Risk Prescription drug management.   ***  {Document critical care time when appropriate:1} {Document review of labs and clinical decision tools ie heart score, Chads2Vasc2 etc:1}  {Document your independent review of radiology images, and any outside records:1} {Document your discussion with family members, caretakers, and with consultants:1} {Document social determinants of health affecting pt's care:1} {Document your decision making why or why not admission, treatments were needed:1} Final Clinical Impression(s) / ED Diagnoses Final diagnoses:  Traumatic conjunctivitis    Rx / DC Orders ED Discharge Orders     None

## 2023-07-26 NOTE — Discharge Instructions (Signed)
Your exam today suggests you have a conjunctivitis and possibly a mild corneal abrasion which should resolve very quickly with the medicines given.  Apply the ketorolac eye drop every 6 hours - 1 drop in the right eye - this is like a liquid ibuprofen to help with pain and inflammation.  Next apply a small ribbon of the erythromycin ointment, also every 6 hours.  You may need sunglasses until the light sensitivity improves.  Plan to see your eye doctor this week if not improved.
